# Patient Record
Sex: Male | Born: 1935 | Race: White | Hispanic: No | Smoking: Former smoker
Health system: Southern US, Community
[De-identification: ages and names within clinical notes are randomized; demographics above are authoritative.]

## PROBLEM LIST (undated history)

## (undated) DIAGNOSIS — K5730 Diverticulosis of large intestine without perforation or abscess without bleeding: Secondary | ICD-10-CM

## (undated) DIAGNOSIS — I6529 Occlusion and stenosis of unspecified carotid artery: Secondary | ICD-10-CM

## (undated) DIAGNOSIS — C44300 Unspecified malignant neoplasm of skin of unspecified part of face: Secondary | ICD-10-CM

## (undated) DIAGNOSIS — M1990 Unspecified osteoarthritis, unspecified site: Secondary | ICD-10-CM

## (undated) DIAGNOSIS — M25561 Pain in right knee: Secondary | ICD-10-CM

## (undated) DIAGNOSIS — N400 Enlarged prostate without lower urinary tract symptoms: Secondary | ICD-10-CM

## (undated) DIAGNOSIS — I4891 Unspecified atrial fibrillation: Secondary | ICD-10-CM

## (undated) DIAGNOSIS — I10 Essential (primary) hypertension: Secondary | ICD-10-CM

## (undated) HISTORY — PX: INGUINAL HERNIA REPAIR: SUR1180

## (undated) HISTORY — PX: TOTAL HIP ARTHROPLASTY: SHX124

## (undated) HISTORY — PX: COLON RESECTION: SHX5231

## (undated) HISTORY — PX: JOINT REPLACEMENT: SHX530

## (undated) HISTORY — PX: SKIN CANCER EXCISION: SHX779

## (undated) HISTORY — DX: Occlusion and stenosis of unspecified carotid artery: I65.29

---

## 2000-04-26 ENCOUNTER — Ambulatory Visit (HOSPITAL_COMMUNITY): Admission: RE | Admit: 2000-04-26 | Discharge: 2000-04-26 | Payer: Self-pay | Admitting: *Deleted

## 2001-11-29 ENCOUNTER — Encounter: Payer: Self-pay | Admitting: Orthopedic Surgery

## 2001-12-05 ENCOUNTER — Encounter: Payer: Self-pay | Admitting: Orthopedic Surgery

## 2001-12-05 ENCOUNTER — Inpatient Hospital Stay (HOSPITAL_COMMUNITY): Admission: RE | Admit: 2001-12-05 | Discharge: 2001-12-10 | Payer: Self-pay | Admitting: Orthopedic Surgery

## 2001-12-08 ENCOUNTER — Encounter: Payer: Self-pay | Admitting: Orthopedic Surgery

## 2002-01-24 ENCOUNTER — Encounter: Payer: Self-pay | Admitting: Orthopedic Surgery

## 2002-01-24 ENCOUNTER — Encounter: Payer: Self-pay | Admitting: Emergency Medicine

## 2002-01-24 ENCOUNTER — Ambulatory Visit (HOSPITAL_COMMUNITY): Admission: EM | Admit: 2002-01-24 | Discharge: 2002-01-24 | Payer: Self-pay | Admitting: Emergency Medicine

## 2002-07-05 ENCOUNTER — Ambulatory Visit (HOSPITAL_BASED_OUTPATIENT_CLINIC_OR_DEPARTMENT_OTHER): Admission: RE | Admit: 2002-07-05 | Discharge: 2002-07-05 | Payer: Self-pay | Admitting: Orthopedic Surgery

## 2002-08-21 ENCOUNTER — Encounter: Payer: Self-pay | Admitting: Orthopedic Surgery

## 2002-08-24 ENCOUNTER — Observation Stay (HOSPITAL_COMMUNITY): Admission: RE | Admit: 2002-08-24 | Discharge: 2002-08-26 | Payer: Self-pay | Admitting: Orthopedic Surgery

## 2004-04-29 ENCOUNTER — Ambulatory Visit: Admission: RE | Admit: 2004-04-29 | Discharge: 2004-05-19 | Payer: Self-pay | Admitting: Radiation Oncology

## 2004-05-20 ENCOUNTER — Ambulatory Visit: Admission: RE | Admit: 2004-05-20 | Discharge: 2004-06-24 | Payer: Self-pay | Admitting: Radiation Oncology

## 2004-07-16 ENCOUNTER — Ambulatory Visit: Admission: RE | Admit: 2004-07-16 | Discharge: 2004-09-19 | Payer: Self-pay | Admitting: Radiation Oncology

## 2004-07-22 ENCOUNTER — Encounter: Admission: RE | Admit: 2004-07-22 | Discharge: 2004-07-22 | Payer: Self-pay | Admitting: Urology

## 2004-10-14 ENCOUNTER — Ambulatory Visit: Admission: RE | Admit: 2004-10-14 | Discharge: 2004-10-21 | Payer: Self-pay | Admitting: Radiation Oncology

## 2004-11-04 ENCOUNTER — Ambulatory Visit: Admission: RE | Admit: 2004-11-04 | Discharge: 2005-02-02 | Payer: Self-pay | Admitting: Radiation Oncology

## 2004-12-03 ENCOUNTER — Inpatient Hospital Stay (HOSPITAL_COMMUNITY): Admission: EM | Admit: 2004-12-03 | Discharge: 2004-12-04 | Payer: Self-pay | Admitting: Emergency Medicine

## 2005-02-03 ENCOUNTER — Ambulatory Visit: Admission: RE | Admit: 2005-02-03 | Discharge: 2005-02-20 | Payer: Self-pay | Admitting: Radiation Oncology

## 2005-05-23 ENCOUNTER — Emergency Department (HOSPITAL_COMMUNITY): Admission: EM | Admit: 2005-05-23 | Discharge: 2005-05-23 | Payer: Self-pay | Admitting: *Deleted

## 2005-06-26 ENCOUNTER — Inpatient Hospital Stay (HOSPITAL_COMMUNITY): Admission: RE | Admit: 2005-06-26 | Discharge: 2005-07-01 | Payer: Self-pay | Admitting: Orthopedic Surgery

## 2005-08-05 ENCOUNTER — Encounter: Admission: RE | Admit: 2005-08-05 | Discharge: 2005-08-05 | Payer: Self-pay | Admitting: Internal Medicine

## 2006-01-05 ENCOUNTER — Encounter: Admission: RE | Admit: 2006-01-05 | Discharge: 2006-01-05 | Payer: Self-pay | Admitting: Internal Medicine

## 2006-12-20 ENCOUNTER — Encounter: Admission: RE | Admit: 2006-12-20 | Discharge: 2006-12-20 | Payer: Self-pay | Admitting: Interventional Cardiology

## 2008-12-19 ENCOUNTER — Ambulatory Visit: Payer: Self-pay | Admitting: Ophthalmology

## 2009-11-26 ENCOUNTER — Ambulatory Visit: Payer: Self-pay | Admitting: Vascular Surgery

## 2009-12-02 ENCOUNTER — Inpatient Hospital Stay (HOSPITAL_COMMUNITY): Admission: RE | Admit: 2009-12-02 | Discharge: 2009-12-03 | Payer: Self-pay | Admitting: Vascular Surgery

## 2009-12-02 ENCOUNTER — Encounter: Payer: Self-pay | Admitting: Vascular Surgery

## 2009-12-02 ENCOUNTER — Ambulatory Visit: Payer: Self-pay | Admitting: Vascular Surgery

## 2009-12-02 HISTORY — PX: CAROTID ENDARTERECTOMY: SUR193

## 2009-12-24 ENCOUNTER — Ambulatory Visit: Payer: Self-pay | Admitting: Vascular Surgery

## 2010-01-16 ENCOUNTER — Ambulatory Visit (HOSPITAL_COMMUNITY): Admission: RE | Admit: 2010-01-16 | Discharge: 2010-01-17 | Payer: Self-pay | Admitting: General Surgery

## 2010-04-06 ENCOUNTER — Encounter: Payer: Self-pay | Admitting: Internal Medicine

## 2010-05-27 LAB — BASIC METABOLIC PANEL
BUN: 23 mg/dL (ref 6–23)
Calcium: 9.7 mg/dL (ref 8.4–10.5)
Chloride: 108 mEq/L (ref 96–112)
GFR calc non Af Amer: >60 mL/min (ref >60)

## 2010-05-27 LAB — DIFFERENTIAL
Basophils Absolute: 0 10*3/uL (ref 0.0–0.1)
Eosinophils Absolute: 0 10*3/uL (ref 0.0–0.7)
Eosinophils Relative: 0 % (ref 0–5)
Lymphocytes Relative: 24 % (ref 12–46)
Lymphs Abs: 1.2 10*3/uL (ref 0.7–4.0)
Monocytes Absolute: 0.5 10*3/uL (ref 0.1–1.0)
Neutro Abs: 3.2 10*3/uL (ref 1.7–7.7)

## 2010-05-27 LAB — SURGICAL PCR SCREEN
MRSA, PCR: NEGATIVE
Staphylococcus aureus: NEGATIVE

## 2010-05-27 LAB — CBC
RBC: 4.13 MIL/uL — ABNORMAL LOW (ref 4.22–5.81)
WBC: 4.9 10*3/uL (ref 4.0–10.5)

## 2010-05-27 LAB — APTT: aPTT: 38 seconds — ABNORMAL HIGH (ref 24–37)

## 2010-05-27 LAB — PROTIME-INR: Prothrombin Time: 15.1 seconds (ref 11.6–15.2)

## 2010-05-29 LAB — CBC
Hemoglobin: 11.1 g/dL — ABNORMAL LOW (ref 13.0–17.0)
MCH: 27.5 pg (ref 26.0–34.0)
MCHC: 33.3 g/dL (ref 30.0–36.0)
MCHC: 33.6 g/dL (ref 30.0–36.0)
MCV: 81.9 fL (ref 78.0–100.0)
RBC: 4.03 MIL/uL — ABNORMAL LOW (ref 4.22–5.81)
RDW: 15.2 % (ref 11.5–15.5)

## 2010-05-29 LAB — APTT: aPTT: 37 seconds (ref 24–37)

## 2010-05-29 LAB — ABO/RH: ABO/RH(D): A POS

## 2010-05-29 LAB — TYPE AND SCREEN: Antibody Screen: NEGATIVE

## 2010-05-29 LAB — COMPREHENSIVE METABOLIC PANEL
Albumin: 4 g/dL (ref 3.5–5.2)
CO2: 27 mEq/L (ref 19–32)
Calcium: 10.1 mg/dL (ref 8.4–10.5)
Chloride: 106 mEq/L (ref 96–112)
GFR calc Af Amer: >60 mL/min (ref >60)
Potassium: 5.3 mEq/L — ABNORMAL HIGH (ref 3.5–5.1)

## 2010-05-29 LAB — BASIC METABOLIC PANEL
Chloride: 104 mEq/L (ref 96–112)
Glucose, Bld: 146 mg/dL — ABNORMAL HIGH (ref 70–99)
Sodium: 134 mEq/L — ABNORMAL LOW (ref 135–145)

## 2010-05-29 LAB — URINALYSIS, ROUTINE W REFLEX MICROSCOPIC
Glucose, UA: NEGATIVE mg/dL
Hgb urine dipstick: NEGATIVE
Urobilinogen, UA: 1 mg/dL (ref 0.0–1.0)
pH: 5 (ref 5.0–8.0)

## 2010-05-29 LAB — PROTIME-INR: Prothrombin Time: 15.1 seconds (ref 11.6–15.2)

## 2010-05-29 LAB — SURGICAL PCR SCREEN: MRSA, PCR: NEGATIVE

## 2010-06-24 ENCOUNTER — Ambulatory Visit: Payer: Self-pay | Admitting: Vascular Surgery

## 2010-06-24 ENCOUNTER — Other Ambulatory Visit: Payer: Self-pay

## 2010-07-22 ENCOUNTER — Ambulatory Visit (INDEPENDENT_AMBULATORY_CARE_PROVIDER_SITE_OTHER): Payer: Medicare Other | Admitting: Vascular Surgery

## 2010-07-22 ENCOUNTER — Other Ambulatory Visit (INDEPENDENT_AMBULATORY_CARE_PROVIDER_SITE_OTHER): Payer: Medicare Other

## 2010-07-22 DIAGNOSIS — Z48812 Encounter for surgical aftercare following surgery on the circulatory system: Secondary | ICD-10-CM

## 2010-07-22 DIAGNOSIS — I6529 Occlusion and stenosis of unspecified carotid artery: Secondary | ICD-10-CM

## 2010-07-23 NOTE — Assessment & Plan Note (Signed)
OFFICE VISIT  REEDER, BRISBY DOB:  05-28-1923                                       07/22/2010 CHART#:[id number]  Patient presents today for a 64-month follow-up of his right carotid endarterectomy for severe asymptomatic disease on 12/02/09.  He did well following his surgery and has had no neurologic deficits and has had no difficulty with the surgical healing.  He has had no new change in his medical status.  He denies any amaurosis fugax, transient ischemic attack, or stroke.  He continues to be a nonsmoker.  PHYSICAL EXAMINATION:  A well-developed and well-nourished white male appearing his stated age.  Blood pressure is 167/72, pulse 41, respirations 20.  HEENT:  Normal.  His right carotid is well-healed without carotid bruits.  He has 2+ radial pulses bilaterally. Neurologically, he is grossly intact without focal weakness or paresthesias.  He underwent a repeat duplex in our office, and this reveals widely patent endarterectomy with no evidence of stenosis and no evidence of stenosis in the left carotid system.  I reassured with this study.  We will follow him in the future in our noninvasive vascular lab protocol with a repeat duplex in 6 months and then drop back to yearly duplex following this.  He will notify us should he develop any difficulty, otherwise we will see him again at that time.    Larina Earthly, M.D. Electronically Signed  TFE/MEDQ  D:  07/22/2010  T:  07/23/2010  Job:  [id number]  cc:   Deirdre Peer. Polite, M.D. Cherylynn Ridges, M.D.

## 2010-07-29 NOTE — H&P (Signed)
HISTORY AND PHYSICAL EXAMINATION   November 26, 2009   Re:  [name], KERLIN                DOB:  Sep 24, 1923   REASON FOR ADMISSION:  Severe asymptomatic right internal carotid artery  stenosis.   HISTORY OF PRESENT ILLNESS:  Patient is an active, healthy 77 year old  gentleman who was found by Dr. Megan Mans to have a carotid bruit.  He was  being seen for preoperative evaluation for potential left inguinal  hernia repair.  He subsequently underwent carotid duplex, which revealed  severe stenosis in the right internal carotid artery.  No significant  stenosis in the left internal carotid artery.  I am seeing him in the  office for further evaluation of this.  Patient denies any prior  symptoms related to this, specifically any amaurosis fugax, transient  ischemic attack, or stroke.  He is right-handed.  He is otherwise very  healthy.  He does have a long-term history of atrial fibrillation for  which he is on warfarin therapy but does not have any history of  myocardial infarction.  He does have a history of hypertension.  He does  not have diabetes or other pulmonary difficulties.  He does have a prior  surgical history of right hip replacement and has had partial colon  resection for diverticulitis.  He has a chronic ventral incisional  hernia from this.  This is not causing him any pain.   SOCIAL HISTORY:  He is widowed.  He does not smoke, having quit in 1980.  Does not drink alcohol.   FAMILY HISTORY:  Negative for premature cirrhotic disease.   REVIEW OF SYSTEMS:  Documented in his chart:  No weight loss or weight  gain in general.  VASCULAR:  Negative.  CARDIAC:  Atrial fibrillation.  GI: Hiatal hernia.  NEUROLOGIC:  Negative.  PULMONARY:  Negative.  HEMATOLOGIC:  Negative.  URINARY:  Positive for frequent urination.  EARS, NOSE, AND THROAT:  Change in hearing.  Change in eyesight.  He  does wear a hearing aid.  MUSCULOSKELETAL:  Positive arthritis  and joint pain.  PSYCHIATRIC:  Negative.  SKIN:  Negative.   PHYSICAL EXAM:  A well-developed, thin white male appearing his stated  age of 84 in no acute distress.  Blood pressure is 163/84, pulse 56,  respirations 18.  He is in no acute distress.  HEENT is normal.  Chest:  Clear bilaterally without rales, rhonchi or wheezes.  Cardiovascular:  He does have a soft carotid bruit in the right and on the left.  Heart  is irregular with no murmur.  His radial, femoral, and popliteal pulses  are 2+ bilaterally.  He has 1+ dorsalis pedis pulses bilaterally.  Abdomen: Soft, nontender.  No masses noted.  He does have a  periumbilical ventral incisional hernia, which is easily inducible.  He  does not have an aneurysm.  Musculoskeletal shows no major deformities  or cyanosis.  Neurologic:  No focal weakness or paresthesias.  Skin  without ulcers or rashes.  Lymphatics without cervical or inguinal  lymphadenopathy.   ALLERGIES:  None.   CURRENT MEDICATIONS:  Warfarin 5 mg as directed.  Digoxin 200 mcg 1  daily.  Enablex 5 mg daily, atenolol 100 mg daily, Tamsulosin 0.4 mg  daily, quinapril 40 mg 2 tablets daily, B12 1000 mcg p.o. daily.   IMPRESSION:  Severe asymptomatic right internal carotid artery stenosis.   PLAN:  I discussed the significance of this  at length with patient and  his daughter present.  I have recommended a right carotid endarterectomy  for reduction of stroke risk.  He understands the risk of a stroke with  surgery is in the 1% to 2% range and the risk for stroke or transient  ischemic attack in the 5% per year range uncorrected.  He understands  and wishes to proceed with surgery.  We have scheduled this on  12/02/2009 at [facility].     Larina Earthly, M.D.  Electronically Signed   TFE/MEDQ  D:  11/26/2009  T:  11/26/2009  Job:  [id number]   cc:   Cherylynn Ridges, M.D.  Deirdre Peer. Polite, M.D.

## 2010-07-29 NOTE — Assessment & Plan Note (Signed)
OFFICE VISIT   RYSHAWN, SANZONE  DOB:  04-15-1923                                       12/24/2009  CHART#:[id number]   Patient presents today for follow-up of his right carotid endarterectomy  for asymptomatic disease on 12/02/09.  He did well in the hospital and  was discharged home on postoperative day #1.  He has had no neurologic  deficits, no wound problems, and has returned to his baseline.   His neck incision is well-healed.  He has no bruits bilaterally.  He is  neurologically intact.  He does have a left inguinal hernia which is  causing him increasing discomfort.   He had a preop evaluation by Dr. Lindie Spruce, who discovered the bruit, and  was sent for carotid surgery prior to his left inguinal hernia.  He is  quite anxious to proceed as soon as possible.  I discussed this by  telephone today with Dr. Dixon Boos nurse, and they will continue with the  progress towards left inguinal hernia repair.     Larina Earthly, M.D.  Electronically Signed   TFE/MEDQ  D:  12/24/2009  T:  12/25/2009  Job:  [id number]   cc:   Cherylynn Ridges, M.D.  Deirdre Peer. Polite, M.D.

## 2010-07-29 NOTE — Procedures (Signed)
CAROTID DUPLEX EXAM   INDICATION:  Carotid artery disease.   HISTORY:  Diabetes:  No.  Cardiac:  No.  Hypertension:  Yes.  Smoking:  No.  Previous Surgery:  No.  CV History:  Asymptomatic.  Amaurosis Fugax , Paresthesias , Hemiparesis                                       RIGHT             LEFT  Brachial systolic pressure:         128               120  Brachial Doppler waveforms:         Within normal limits                Within normal limits  Vertebral direction of flow:        Antegrade         Antegrade  DUPLEX VELOCITIES (cm/sec)  CCA peak systolic                   61                92  ECA peak systolic                   88                80  ICA peak systolic                   451               92  ICA end diastolic                   119               30  PLAQUE MORPHOLOGY:                  Heterogenous      Smooth,  heterogenous  PLAQUE AMOUNT:                      Large             Mild/moderate  PLAQUE LOCATION:                    ICA               CCA, ICA   IMPRESSION:  1. Right internal carotid artery velocities suggest an 80% to 99%      stenosis.  2. Left internal carotid artery velocities suggest a 20% to 39%      stenosis.   ___________________________________________  Larina Earthly, M.D.   EM/MEDQ  D:  11/26/2009  T:  11/26/2009  Job:  [id number]

## 2010-07-31 NOTE — Procedures (Unsigned)
CAROTID DUPLEX EXAM  INDICATION:  Right carotid endarterectomy.  HISTORY: Diabetes:  No. Cardiac:  No. Hypertension:  Yes. Smoking:  No. Previous Surgery:  Right carotid endarterectomy on 12/02/2009. CV History:  Currently asymptomatic. Amaurosis Fugax No, Paresthesias No, Hemiparesis No.                                      RIGHT             LEFT Brachial systolic pressure:         134               130 Brachial Doppler waveforms:         Normal            Normal Vertebral direction of flow:        Antegrade         Antegrade DUPLEX VELOCITIES (cm/sec) CCA peak systolic                   92                119 ECA peak systolic                   123               136 ICA peak systolic                   65                89 ICA end diastolic                   18                20 PLAQUE MORPHOLOGY:                  Heterogenous      Mixed PLAQUE AMOUNT:                      Mild              Mild PLAQUE LOCATION:                    CCA               ICA/ECA/CCA  IMPRESSION: 1. Patent right carotid endarterectomy site with no right internal     carotid artery stenosis. 2. No hemodynamically significant stenosis of the left proximal     internal carotid artery with plaque formations as described above. 3. Significant improvement of the right internal carotid artery     velocities when compared to the previous examination on 11/26/2009     with the left internal carotid artery remaining stable.  ___________________________________________ Larina Earthly, M.D.  CH/MEDQ  D:  07/23/2010  T:  07/23/2010  Job:  [id number]

## 2010-08-01 NOTE — Op Note (Signed)
NAME:  Ruben, Burns                          ACCOUNT NO.:  [id number]   MEDICAL RECORD NO.:  [id number]                   PATIENT TYPE:  INP   LOCATION:  0004                                 FACILITY:  [facility]   PHYSICIAN:  Gus Rankin. Aluisio, M.D.              DATE OF BIRTH:  06-Nov-1923   DATE OF PROCEDURE:  12/05/2001  DATE OF DISCHARGE:                                 OPERATIVE REPORT   PREOPERATIVE DIAGNOSIS:  Osteoarthritis, right hip.   POSTOPERATIVE DIAGNOSIS:  Osteoarthritis, right hip.   PROCEDURE:  Right total hip arthroplasty.   SURGEON:  Gus Rankin. Aluisio, M.D.   ASSISTANT:  Alexzandrew L. Julien Girt, P.A.   ANESTHESIA:  General.   ESTIMATED BLOOD LOSS:  350   DRAINS:  Hemovac x1.   COMPLICATIONS:  None.   CONDITION:  Stable to recovery.   BRIEF CLINICAL NOTE:  [name] is a 77 year old male with severe  osteoarthritis of the right hip with pain refractory to nonoperative  management. He presents now for right total hip arthroplasty.   DESCRIPTION OF PROCEDURE:  After successful administration of general  anesthetic, the patient was placed in the left lateral decubitus position,  right side up and held with the hip positioner. The right lower extremity  was isolated from his perineum with plastic drapes and prepped and draped in  the usual sterile fashion. Many posterolateral incisions utilized, skin cut  with a 10 blade in the subcutaneous tissue to the level of the fascia lata  which was incised in line with the skin incision. The sciatic nerve was  palpated and protected, short external rotators isolated off the femur,  capsulectomy performed. Hip is dislocated and center of the femoral head  marked. Trial prosthesis placed such that the center of the trial head  corresponds with the center of his native femoral head. Osteotomy line is  marked on the femoral neck and osteotomy noted with an oscillating saw. The  femur is then retracted anteriorly and  acetabular exposure obtained.   The labrum is removed and all soft tissue on the border of the acetabulum  removed, we had excellent exposure. Reaming is started with a 51 mm coursing  in increments of 2 to a 50. A 60 mm pinnacle acetabular shell is then placed  in his anatomic eversion transfixed with a dome screw. A trial 32 mm neutral  liner is then placed.   The femur is repaired first with the canal finder and then with irrigation.  Axial reaming is performed up to 15.5 mm, proximal reaming up to 16F and the  sleeve machine to a large, a 16F large trial sleeve is placed, a 20 x 15  stem, 36 plus 8 neck and we placed it matching his native anteversion. A 32  plus zero head trial is placed, hip reduced. There is a little bit of soft  tissue laxity. We subsequently tried a  32 plus 6 which corrected the soft  tissue laxity and did not leak any lengthening of the leg compared to the  other side. The hip was reduced with outstanding stability, full extension,  full external rotation, 70 degrees flexion, 40 degrees adduction, 90 degrees  internal rotation and 90 degrees flexion, 70 degrees internal rotation. The  wound is copiously irrigated, trial is removed and the permanent apex hole  eliminator placed and the permanent 32 mm neutral marathon liner is placed  into the 60 mm shell. Permanent 56F large sleeve is placed and the 20 x 15  stem with 36 plus 8 neck matching his anteversion. The permanent 32 plus 6  head is placed, hip reduced with the same stability parameters. The patient  is copiously irrigated with antibiotic solution and short external rotators  reattached to the femur through drill holes. The fascia lata was closed over  a Hemovac drain, interrupted #1 Vicryl, subcu closed with #1 and 2-0 Vicryl,  subcuticular with running 4-0 Monocryl. The incision and cleaned and dried  and Steri-Strips and a bulky sterile dressing applied. The patient  subsequently awakened and  transported to recovery in stable condition.                                               Gus Rankin Aluisio, M.D.    FVA/MEDQ  D:  12/05/2001  T:  12/05/2001  Job:  [id number]

## 2010-08-01 NOTE — Op Note (Signed)
NAMEFREDIE, MAJANO NO.:  [id number]   MEDICAL RECORD NO.:  [id number]          PATIENT TYPE:  INP   LOCATION:  1824                         FACILITY:  [facility]   PHYSICIAN:  Leonides Grills, M.D.     DATE OF BIRTH:  [birth year]   DATE OF PROCEDURE:  12/03/2004  DATE OF DISCHARGE:                                 OPERATIVE REPORT   PREOPERATIVE DIAGNOSIS:  Right dislocated total hip arthroplasty.   POSTOPERATIVE DIAGNOSIS:  Right dislocated total hip arthroplasty.   PROCEDURE:  Closed reduction, right dislocated total hip arthroplasty.   SURGEON:  Leonides Grills, M.D.   ASSISTANT:  Lianne Cure, P.A.   FINDINGS:  Post reduction film showed a concentric reduction.   INDICATIONS:  This is a 77 year old male who today was reaching for a phone  while sitting on the bed and felt immediate right hip pain.  This is his  second dislocated total hip.  His hip was done by Dr. Lequita Halt three years  ago.  He was consented for the above procedure.  All risks, including  fracture and the possibility of not being able to reduce the hip, were all  explained, questions were encouraged and answered.   PROCEDURE:  The patient was brought to the operating room and placed in the  supine position after adequate general endotracheal anesthesia was  administered.  Under general reduction, 90 degrees hip flexion, 90 degrees  knee flexion, the hip was gently reduced.  X-ray was obtained and showed a  concentric reduction.  A knee immobilizer was applied.  The patient was  taken to the PAR.      Leonides Grills, M.D.  Electronically Signed     PB/MEDQ  D:  12/03/2004  T:  12/04/2004  Job:  [id number]

## 2010-08-01 NOTE — Op Note (Signed)
NAME:  [name], [name]                          ACCOUNT NO.:  [id number]   MEDICAL RECORD NO.:  [id number]                   PATIENT TYPE:  AMB   LOCATION:  DSC                                  FACILITY:  [facility]   PHYSICIAN:  Dionne Ano. Everlene Other, M.D.         DATE OF BIRTH:  [birth year]   DATE OF PROCEDURE:  08/24/2002  DATE OF DISCHARGE:  07/05/2002                                 OPERATIVE REPORT   PREOPERATIVE DIAGNOSIS:  Scapholunate advanced collapse, left wrist.  Degenerative changes with failure of conservative management.   POSTOPERATIVE DIAGNOSIS:  Scapholunate advanced collapse, left wrist.  Degenerative changes with failure of conservative management.   PROCEDURE:  1. Left scaphoid excision (corpectomy of the scaphoid).  2. Left four-corner fusion with autologous bone graft (capitate, hamate,     lunate, and triquetral fusion) with Acumed bone fusion plate and     autologous bone graft.  3. Use of Symphony platelet ridge aggregate from [employer].  4. PIN neurectomy (posterior interosseous nerve neurectomy), left wrist.  5. Stress radiography, left wrist.  6. Left wrist partial styloidectomy.   SURGEON:  Dionne Ano. Amanda Pea, M.D.   ASSISTANT:  Karie Chimera, P.A.-C.   COMPLICATIONS:  None.   ANESTHESIA:  General with preoperative axillary block.   ESTIMATED BLOOD LOSS:  Minimal.   DRAINS:  One.   COMPLICATIONS:  None.   INDICATIONS FOR THE PROCEDURE:  This patient is a 77 year old male who  presents with the above-mentioned diagnosis.  I have counseled him in  regards to the risks and benefits of surgery, including risk of patient  bleeding, anesthesia, damage to normal structures, and failure of the  surgery to accomplish the intended goals of relieving symptoms and restoring  function.  The patient has asked to proceed.  All questions have been  encouraged and answered preoperatively.   OPERATIVE FINDINGS:  The patient had end-stage degenerative change about  the  scaphoid fossa and scaphoid.  He had capitolunate degenerative changes,  which were quite severe.  The lunate fossa and lunate most proximal aspects  were intact and okay for a four-corner effusion.  He underwent four-corner  effusion, partial styloidectomy, scaphoid excision, PIN neurectomy, and  stress radiography without difficulty.   OPERATIVE PROCEDURE IN DETAIL:  The patient was given anesthesia  preoperatively.  He was given an axillary block.  It was slightly  incomplete, and he underwent a general anesthetic in the operative suite.  I  made sure that the patient had preoperative antibiotics in the form of  Ancef.  He was again lain supine and appropriately padded and prepped and  draped in the usual sterile fashion with Betadine scrub and paint about the  left upper extremity.  I performed examination under anesthesia revealing  crepitance, which was not surprising, given his preoperative diagnosis.  Following this, he had a midline incision made.  Dissection was carried  sharply down to the retinaculum, which  was identified.  It was then Z-  lengthened after skin flaps were elevated.  I created an interval between  the third and fourth dorsal compartments.  The EPL was transposed in the  subcutaneous tissue, and a PIN (posterior interosseous nerve) neurectomy was  performed with correction and cauterization technique.  I then entered the  capsule and performed a synovectomy and debridement.  I then performed a  scaphoid excision with combination of curette, osteotome, and rongeur.  Once  this was done, I then irrigated the wound copiously and performed a partial  styloidectomy by removing the distal 5 mm of the styloid tip where  degenerative changes were notable and a prominence palpable.  This was  smoothed, and care was taken to avoid the superficial radial nerve and the  deep branch of the radial artery during this dissection.  Following this,  the patient underwent  identification of the capitolunate, lunatotriquetral,  capitohamate, and lunatohamate  joint.  The patient had the articular  surfaces denuded by the four-corner effusion preparation areas, including  the capitohamate, capitolunate, lunatotriquetral, and triquetrohamate  joints.  These were denuded with a rongeur to cancellous bone to provide  ample volume/surface area for a four-corner effusion.  Once this was done, I  then took the reamer for the Acumed system and assembled it.  Prior to this,  I preplaced three K-wires for stability purposes about the four-corner  construct according to protocol.  Prior to this, I reduced the lunate in the  fossa and made sure it was out of the dorsiflexed position, which was noted  preoperatively.  It was corrected, checked under x-ray, stress radiography  performed, and following this, the reamer was placed, and the patient then  had placement of the Acumed plate.  Seven screws were placed, cancellous in  nature, which received excellent purchase.  The hamate, followed by lunate,  followed by capitate, followed by triquetral screws were placed, and then  additional capitate, lunate, and triquetral screws were placed to my  satisfaction.  Ample bone graft was placed pre- and post-plate fixation.  Following this, the platelet ridge mixture was placed in the wound, and the  cap was placed along with additional bone graft.  I then closed the  longitudinal limb of the capsule but did not close the transverse limb so as  not to cause a stiffness postoperatively.  I then repaired the Z-lengthened  retinaculum and left the EPL transposed in subcutaneous position and  deflated the tourniquet.  Thrombin rich and poor were placed in the wound,  and the patient had excellent refill, soft compartments, and minimal  bleeding, as bleeding was controlled during initial dissection with bipolar electrocautery and regular cautery.  The patient tolerated this well.  The   wound was then closed in layers with 3-0 Vicryl, followed by Prolene,  followed by placement of thrombin poor about the skin edge.  Xeroform was  placed.  The drain was hooked up to suction, which was a TLS drain, and the  patient then had a volar plaster splint applied, thumb spica in nature.  He  tolerated this well, and there were no complicating features.  He was taken  to the recovery room in stable condition.  All sponge, needle, and  instrument counts were reported as correct.  He will be monitored and kept  overnight for IV antibiotics, pain medicine, OT consult for doing controlled  range of motion, and appropriate postop measures.  I have discussed  ________, Lawson Radar,  and all questions have been encouraged and answered.  It  has been a pleasure to see him.                                               Dionne Ano. Everlene Other, M.D.    Nash Mantis  D:  08/24/2002  T:  08/25/2002  Job:  [id number]

## 2010-08-01 NOTE — H&P (Signed)
NAME:  [name], [name]                          ACCOUNT NO.:  [id number]   MEDICAL RECORD NO.:  [id number]                   PATIENT TYPE:  INP   LOCATION:  NA                                   FACILITY:  [facility]   PHYSICIAN:  Gus Rankin. Aluisio, M.D.              DATE OF BIRTH:  [birth year]   DATE OF ADMISSION:  12/05/2001  DATE OF DISCHARGE:                                HISTORY & PHYSICAL   CHIEF COMPLAINT:  Right hip pain.   HISTORY OF PRESENT ILLNESS:  The patient is a 77 year old male who has been  seen in consultation by Dr. Ollen Gross.  The pain has been increasing  now.  It has been intermittent over the past several years, but has become  more progressive and constant over the past six months.  He has difficulty  in pain with weightbearing and has also started experiencing pain at night.  He is very active but the pain has started to interfere with his daily  activities.  He is seen in the office and found to have osteoarthritis of  the right hip.  It is felt he has reached a point where he can benefit from  undergoing hip replacement.  Risks and benefits have been discussed.  The  patient has elected to proceed with surgery.   ALLERGIES:  No known drug allergies.   MEDICATIONS:  1. Coumadin daily stopped prior to surgery.  2. Accupril 40 mg daily.  3. Toprol XL 50 mg daily.  4. Celebrex 200 mg b.i.d.   PAST MEDICAL HISTORY:  Atrial fibrillation, diverticulosis, hypertension,  and also a history of colon polyps.   PAST SURGICAL HISTORY:  He has undergone a colonoscopy with polypectomy.   SOCIAL HISTORY:  He is widowed.  Has three children.  Denies use of tobacco  products, only occasional intake of alcohol.   FAMILY HISTORY:  Mother deceased age 78 with a history of arthritis.  Father  deceased age 73 with heart trouble.   REVIEW OF SYMPTOMS:  GENERAL:  No fevers, chills, night sweats.  NEUROLOGIC:  No seizures, syncope, or paralysis.  RESPIRATORY:  No shortness  of breath,  productive cough, or hemoptysis.  CARDIOVASCULAR:  He does have  hypertension, atrial fibrillation.  No chest pain, angina, orthopnea.  GASTROINTESTINAL:  No nausea, vomiting, diarrhea, constipation.  GENITOURINARY:  No dysuria, hematuria, discharge.  MUSCULOSKELETAL:  Pertinent that of the right hip found in the history of present illness.   PHYSICAL EXAMINATION:  VITAL SIGNS:  Pulse 82, respirations 16, blood  pressure 142/76.  GENERAL:  The patient is a 77 year old male well-nourished, well-developed,  appears to be in no acute distress.  He is alert, oriented, and cooperative,  very pleasant at time of examination.  HEENT:  Normocephalic, atraumatic.  Pupils round and reactive.  Oropharynx  clear.  EOMs are intact.  The patient does have upper and lower dentures  noted.  NECK:  Supple.  CHEST:  Clear to auscultation anterior and posterior chest walls.  HEART:  Irregular rhythm is noted.  S1 and S2.  No rubs, thrills,  palpitations, or murmurs are appreciated.  ABDOMEN:  Soft, flat, nontender.  Bowel sounds are present.  RECTAL:  Not done.  Not pertinent to present illness.  BREASTS:  Not done.  Not pertinent to present illness.  GENITALIA:  Not done.  Not pertinent to present illness.  EXTREMITIES:  Right lower extremity:  The right hip has flexion up to 90  degrees.  There is no internal rotation, only 5 degrees with external  rotation, only 10 degrees of abduction.  During ambulation he does have an  antalgic gait with an abductor lurch to his gait.  Motor function is intact.   IMPRESSION:  1. Osteoarthritis right hip.  2. Atrial fibrillation.  3. Hypertension.  4. Diverticulosis.  5. History of colon polyps with polypectomy.   PLAN:  The patient will be admitted to [facility] to undergo a  right total hip replacement arthroplasty.  The patient is to be admitted for  surgery.  Surgery will be performed by Dr. Ollen Gross.     Alexzandrew L.  [place], [place]                Gus Rankin Aluisio, M.D.    ALP/MEDQ  D:  11/30/2001  T:  11/30/2001  Job:  [id number]   cc:   Ike Bene, MD  [address]  [place]  [place] [address]  Fax: [phone]

## 2010-08-01 NOTE — Op Note (Signed)
   NAME:  [name], [name] NO.:  [id number]   MEDICAL RECORD NO.:  [id number]                   PATIENT TYPE:  AMB   LOCATION:                                       FACILITY:  [facility]   PHYSICIAN:  Artist Pais. Mina Marble, M.D.           DATE OF BIRTH:  1924/01/31   DATE OF PROCEDURE:  07/05/2002  DATE OF DISCHARGE:                                 OPERATIVE REPORT   PREOPERATIVE DIAGNOSIS:  Complex laceration, right long finger.   POSTOPERATIVE DIAGNOSIS:  Complex laceration, right long finger.   PROCEDURE:  Irrigation and debridement and primary closure, complex  laceration, left long finger.   SURGEON:  Artist Pais. Mina Marble, M.D.   ASSISTANT:  None.   ANESTHESIA:  Digital sheath block performed by the surgeon.   TOURNIQUET:  Penrose drain.   COMPLICATIONS:  None.   DESCRIPTION OF PROCEDURE:  The patient was taken to the procedure room and  was placed supine on the operating table. Then a 1% plain Lidocaine digital  sheath block was performed by the surgeon. Adequate anesthesia was obtained.  The right hand was prepped and draped in the usual sterile fashion. A  Penrose drain was used as a tourniquet. Once this was done the complex  laceration on the volar and radial aspects of the long finger was irrigated  and debrided of nonviable material and the wound was closed with 5-0 nylon.  After this was done a sterile dressing of Xeroform, 4 x 4s and Coban were  applied. The patient tolerated the procedure well. He went to the recovery  room and then home in stable fashion. He was discharged on Keflex 500 mg 1  p.o.  q.i.d. for 1 week for antibiotic prophylaxis and Vicodin for pain.                                               Artist Pais Mina Marble, M.D.    MAW/MEDQ  D:  07/05/2002  T:  07/06/2002  Job:  [id number]

## 2010-08-01 NOTE — Consult Note (Signed)
[name], TIERNO NO.:  [id number]   MEDICAL RECORD NO.:  [id number]          PATIENT TYPE:  INP   LOCATION:  1824                         FACILITY:  [facility]   PHYSICIAN:  Leonides Grills, M.D.     DATE OF BIRTH:  [birth year]   DATE OF CONSULTATION:  12/03/2004  DATE OF DISCHARGE:                                   CONSULTATION   CHIEF COMPLAINT:  Right hip pain.   HISTORY:  This is a 77 year old male who underwent a right total hip  arthroplasty three years ago.  Initially postoperatively, dislocated his  hip.  He has done well since until today when he was sitting on his bed, he  turned to answer the phone and his hip dislocated.  He now presents with his  family.   PHYSICAL EXAMINATION:  His right lower extremity is short and externally  rotated, palpable pulses, active and passive range of motion of toes are  intact.  X-ray shows superior posterior dislocation of the right hip.  No  obvious fracture or loosening.   IMPRESSION:  Dislocated right total hip arthroplasty, posterior superior.   PLAN:  I explained to [name] as well as his family at this point we will  take him back for closed reduction for the longer the hip stays out, it is  much more difficult for it to be reduced.  We will do this immediately under  anesthesia and the patient agrees as well as the family.  We went over some  great detail today as well as the risks which include iatrogenic fracture,  possibility of not being able to reduce this and redislocation were all  explained.  Questions were encouraged and answered.      Leonides Grills, M.D.  Electronically Signed     PB/MEDQ  D:  12/03/2004  T:  12/04/2004  Job:  [id number]

## 2010-08-01 NOTE — Consult Note (Signed)
NAMELAYDEN, [name] NO.:  [id number]   MEDICAL RECORD NO.:  [id number]          PATIENT TYPE:  INP   LOCATION:  1513                         FACILITY:  [facility]   PHYSICIAN:  Meade Maw, M.D.    DATE OF BIRTH:  Apr 21, 1923   DATE OF CONSULTATION:  06/26/2005  DATE OF DISCHARGE:                                   CONSULTATION   CONSULTING PHYSICIAN:  Ollen Gross, M.D.   INDICATIONS FOR PROCEDURE:  Atrial fibrillation with rapid ventricular  response.   HISTORY:  [name] is a very pleasant 77 year old gentleman well known  to me for nonischemic cardiomyopathy with an ejection fraction of 35% found  during catheterization in February 2002. The patient has underwent a right  hip revision secondary to frequent dislocation of his hips. During surgery,  he had an abrupt episode of uncontrolled atrial fibrillation with rates of  140s to 150s. He was given IV cardizem  in the OR with subsequent return of  heart rates to 60s and 70s. He has had no further tachycardia arrhythmia  since 1:30 this afternoon. The patient has been followed by me extensively  in the past for nonischemic cardiomyopathy. He has been well compensated.  Activity has been limited by arthritic pain. He is able to walk several  miles until his hip became an issue. He has had no orthopnea, palpitations,  or tachycardia arrhythmias. He has been evaluated by Dr. Graciela Husbands in the past  for possible AICD implantation and Dr. Graciela Husbands felt that his exercise capacity  was excellent and therefore did not require AICD implantation.   PAST MEDICAL HISTORY:  Otherwise significant for hypertension, chronic  anticoagulation, diverticulitis, history of prostate cancer status post  radiation therapy.   CURRENT MEDICATIONS:  1.  Digitek 250 mcg daily.  2.  Quinapril 40 mg daily.  3.  Toprol XL 100 mg daily.  4.  Coumadin adjusted to maintain an INR of 2-3.   Of note, the patient's Toprol was increased from 50  mg to 100 mg on his last  office visit for greater antihypertensive control.   ALLERGIES:  He has no known drug allergies.   PAST SURGICAL HISTORY:  1.  Significant for colon resection for diverticulitis.  2.  Right total hip replacement.  3.  The patient right hip revision.  4.  Colonoscopy.   FAMILY HISTORY:  Noncontributory.   SOCIAL HISTORY:  He is a widow, has 3 children, grandchildren and great  grandchildren. Previously worked in [employer], dispensing. There is no history of  alcohol, tobacco or illicit drug use.   REVIEW OF SYSTEMS:  He has had no palpitations or tachycardia arrhythmias.  Prior to surgery he had been in his usual state of health.   PHYSICAL EXAMINATION:  GENERAL:  Reveals an elderly male appearing his  stated age. He is in no acute distress.  VITAL SIGNS:  Heart rate is ranging 50-80 beats per minute on telemetry.  Blood pressure is 160/76, respiratory rate 11. His SaO2 saturation is 100%  on 2 liters.  HEENT:  Unremarkable. He has good carotid upstrokes. There  is no neck bend  extension noted.  PULMONARY:  Reveals breath sounds which are equal and clear to auscultation.  CARDIOVASCULAR:  Reveals an irregular rhythm, there is normal S1, normal S2,  there is no S3 noted.  ABDOMEN:  Soft, benign with hypoactive bowel sounds.  EXTREMITIES:  Reveal distal pulses which are equal and palpable. Pneumatic  stockings are in place. Skin is warm and dry.  NEUROLOGIC:  Nonfocal. Orlene Erm is appropriate.   LABORATORY DATA:  Laboratory data from the 10th of April reveals a white  count of 4.9, hemoglobin of 12.9, hematocrit of 39, platelet count 153. He  had normal electrolytes, his creatinine was 1.0.   IMPRESSION:  1.  Atrial fibrillation. The patient is currently rate controlled. Would      continue with Toprol at 100 mg daily and digoxin 250 mcg daily. The      patient has been stable and therefore may transfer to a regular rehab      bed. He demonstrated normal  electrolytes and normal CBC on the 10th of      April. Would repeat his CBC for evaluation of anemia.  2.  Chronic anticoagulation. His Coumadin should be restarted in the      postoperative period. His INR goal is 2-3.  3.  Hypertension. His blood pressure is marginally controlled. Will monitor      at this time.  4.  Nonischemic cardiomyopathy. Accurate I&O. Discontinue IV fluids when      patient is taking p.o.   Thank you for allowing me to participate in the care of your patient. Please  call me should you have further questions.      Meade Maw, M.D.  Electronically Signed     HP/MEDQ  D:  06/26/2005  T:  06/27/2005  Job:  [id number]

## 2010-08-01 NOTE — Op Note (Signed)
   NAME:  Rawleigh, Rode NO.:  [id number]   MEDICAL RECORD NO.:  [id number]                   PATIENT TYPE:   LOCATION:                                       FACILITY:   PHYSICIAN:  Leonides Grills, M.D.                  DATE OF BIRTH:   DATE OF PROCEDURE:  01/24/2002  DATE OF DISCHARGE:                                 OPERATIVE REPORT   PREOPERATIVE DIAGNOSES:  Right dislocated total hip arthroplasty.   POSTOPERATIVE DIAGNOSES:  Right dislocated total hip arthroplasty.   OPERATION:  Closed reduction right total hip arthroplasty.   ANESTHESIA:  General endotracheal tube.   SURGEON:  Leonides Grills, M.D.   COMPLICATIONS:  None.   FINDINGS:  Post reduction x-rays showed a concentric reduction right total  hip arthroplasty.   DESCRIPTION OF PROCEDURE:  The patient was brought to the operating room and  placed in supine position. After adequate general anesthesia was  administered, a closed reduction was then performed in the right total knee  arthroplasty. This reduced very easily. The hip was ranged. The hip had a  tendency to slide out with flexion and adduction. We obtained the center of  reduction, placed in a knee immobilizer, obtained an x-ray. AP hip showed a  concentric reduction. The patient was stable to PR.                                                Leonides Grills, M.D.    PB/MEDQ  D:  01/24/2002  T:  01/25/2002  Job:  [id number]

## 2010-08-01 NOTE — Discharge Summary (Signed)
[name], [name]                ACCOUNT NO.:  [id number]   MEDICAL RECORD NO.:  [id number]          PATIENT TYPE:  INP   LOCATION:  1513                         [facility]ACILITY[facility]   PHYSICIAN:  Ollen Gross, M.D.    DATE OF BIRTH:  05/16/1923   DATE OF ADMISSION:  06/26/2005  DATE OF DISCHARGE:  07/01/2005                                 DISCHARGE SUMMARY   ADMISSION DIAGNOSES:  1.  Instability right total hip.  2.  Hypertension.  3.  Paroxysmal atrial fibrillation.  4.  Benign prostatic hypertrophy.   DISCHARGE DIAGNOSES:  1.  Unstable right total hip arthroplasty, status post right hip revision      with acetabular liner over to constrained liner.  2.  Postoperative atrial fibrillation with rapid ventricular rate.  3.  Hypertension.  4.  Paroxysmal atrial fibrillation.  5.  Benign prostatic hypertrophy.   PROCEDURE:  On June 26, 2005, right hip acetabular liner revision over to a  constrained liner, surgeon Dr. Lequita Halt, assistant Alexzandrew L. Julien Girt,  P.A.-C, anesthesia general.   CONSULTATIONS:  Meade Maw, M.D., cardiology.   HISTORY OF PRESENT ILLNESS:  Mr. Mcgaha is an 77 year old male with a right  total hip done several years ago.  He has had three episodes of posterior  dislocation and now presents for revision over to constrained liner.   LABORATORY DATA AND X-RAY FINDINGS:  CBC on admission with hemoglobin 12.9,  hematocrit 39.1, normal white count of 4.9.  Serial H&H's were followed.  Hemoglobin dropped to 11.5.  Last noted H&H 10.8 and 32.4.  PT/PTT preop  16.9/44 respectively, INR normal at 1.4.  Serial pro times followed with  PT/INR 23.4 and 2.1.  Chem panel on admission all within normal limits with  the exception of low albumin of 3.4.  Serial BMETs were followed and  electrolytes remained within normal limits.  Glucose went from 96, 138 back  down to 109.  Urinalysis preop negative.  Blood group type A positive.   Portable pelvis on June 26, 2005, with satisfactory acetabular revision  with right total hip prosthesis.  Right hip films on June 26, 2005, with  satisfactory appearance of right total hip.  EKG dated May 23, 2005, with  atrial fibrillation with rapid ventricular response with premature  conductive complexes meeting criteria for LVH, ST depression, consider  subendocardial injury and digitalis effect.   HOSPITAL COURSE:  The patient was admitted to [facility] and  tolerated the procedure well and went to the recovery room and then later to  the orthopedic floor.  Later that afternoon, the patient developed rapid  rate.  Heart rate suddenly got up into 140s and 150s and was given IV  medications with the heart rate decreasing to the 120s and back down into  the 60s and 70s where he stabilized after surgery.  The patient was seen in  PACU by [facility], Dr. Fraser Din.  He was rate controlled on his  current medications and required IV medications following surgery.  Rate did  improve.  He was later transferred to the medical  surgical floor for further  care.  On postop day #1, he was doing well with bowels stable.  Hemovac  drain which was placed at the time of surgery was pulled without difficulty.  He started getting up out of bed with physical therapy.  By postop day #2,  rate was in the 80s with no cardiac complaints.  Dressing was changed.  Hip  incision looked very good.  He was allowed to be up out of bed weightbearing  as tolerated.  On day #3, he was seen in rounds and was only slowly  progressing with physical therapy and felt he needed another day.  His heart  rate remained stable.  He was getting up around 50 feet, but did ambulate  later 100 feet later that afternoon.  He continued to progress well and  incision looked good.  He continued to progress with physical therapy.  He  remained stable from a cardiac standpoint.  He was ready to go home by July 01, 2005.   DISPOSITION:  The  patient is discharged home on July 01, 2005.   DISCHARGE MEDICATIONS:  Coumadin, Percocet, Robaxin.  Resume home  medications.   DIET:  As tolerated.   FOLLOW UP:  Follow up next Tuesday or Thursday following discharge on April  24, or April 26.  Call the office for an appointment at [phone].   ACTIVITY:  Weightbearing as tolerated.  Home health PT and nursing.  Hip  precautions with protocol.   CONDITION ON DISCHARGE:  Improved.      Alexzandrew L. Julien Girt, P.A.      Ollen Gross, M.D.  Electronically Signed    ALP/MEDQ  D:  08/07/2005  T:  08/08/2005  Job:  [id number]   cc:   Meade Maw, M.D.  Fax: [phone]

## 2010-08-01 NOTE — Cardiovascular Report (Signed)
[facility]  Patient:    [name], [name]                     MRN: [id number] Proc. Date: 04/26/00 Adm. Date:  [id number] Disc. Date: [id number] Attending:  Mora Appl CC:         Barbette Hair. Vaughan Basta., M.D.   Cardiac Catheterization  PROCEDURE PERFORMED:  Left heart catheterization, right heart catheterization and  single plane ventriculogram, thermodilution cardiac output.  INDICATIONS FOR PROCEDURE:  Severe diffuse hypokinesis with moderate left ventricular enlargement, ejection fraction of 30%.  Moderate mitral regurgitation with a peak right ventricle systolic pressure of 34 mmHg.  BRIEF HISTORY:  [name] is a 77 year old gentleman, who has been followed for chronic atrial fibrillation and hypertension.  He was evaluated by Dr. Merril Abbe on January 6 for increasing shortness of breath.  A transthoracic echocardiogram was performed with findings as noted above.  The patient was initiated on Altace and Lasix and subsequently has his shortness of breath has significantly improved.  DESCRIPTION OF PROCEDURE:  After obtaining written informed consent, the patient was brought to the cardiac catheterization lab in the postabsorptive state.  Preoperative sedation was achieved using with IV Versed.  The right groin was prepped and draped using sterile technique.  Local anesthesia was achieved using 1% Xylocaine.  A 6 French hemostasis sheath was placed into the right femoral artery using modified Seldinger technique.  An 8 [place] hemostasis sheath was placed into the right femoral vein using modified Seldinger technique.  Right heart catheterization was subsequently performed.  Right atrial mean was 5, right ventricle pressure was 33/4, PA pressure was 39/16.  His wedge was 22.  LV pressure is 163/16.  Aortic pressure is 164/97.  His cardiac output by Fick was 3.6.  His cardiac output by thermodilution was 3.2.  His O2 saturations were  as follows:  O2 saturation from the aortic was 97%, from the pulmonary artery was 63%, from the SVC was 66%, from the right atrial was 71%, from the IVC was 67%.  There was no significant stepup noted.  Cardiac catheterization:  Single plane ventriculogram revealed diffuse hypokinesis.  The ejection fraction is approximately 35%.  The left ventricle is enlarged.  There is mild mitral regurgitation noted.  CORONARY ANGIOGRAPHY:  Left main:  The left main coronary artery bifurcated into a large left anterior descending and circumflex vessel.  There was no significant disease in the left main coronary artery.  Left anterior descending:  Left anterior descending gave rise to multiple diagonals, approximately three to four small to moderate diagonal.  There was luminal irregularities in the circumflex vessel of up to approximately 30%.  Circumflex vessel:  The circumflex vessel was a small to moderate branch. There was luminal irregularities in the circumflex vessel of up to 30-40% prior to the first obtuse marginal.  Right coronary artery:  The right coronary artery was a very large, dominant artery.  It gave rise to a large PDA and PL branch as well as multiple posterolateral branches.  There was no significant disease in the right coronary artery.  IMPRESSION: 1. Nonischemic dilated cardiomyopathy, ejection fraction at approximately    30%.  Cardiomyopathy is most likely related to hypertension. 2. Mild pulmonary hypertension.  The mitral regurgitation noted has not    resulted in secondary pulmonary hypertension. 3. Chronic atrial fibrillation.  RECOMMENDATIONS:  To continue with a diuretic and beta blockers for management of his blood pressure.  Recommend chronic anticoagulation for his atrial fibrillation.  The patient will be enrolled in the congestive heart failure clinic for further management. DD:  04/26/00 TD:  04/27/00 Job: [id number]

## 2010-08-01 NOTE — Op Note (Signed)
NAMEJERMAYNE, [name]                ACCOUNT NO.:  [id number]   MEDICAL RECORD NO.:  [id number]          PATIENT TYPE:  INP   LOCATION:  1513                         FACILITY:  [facility]   PHYSICIAN:  Ollen Gross, M.D.    DATE OF BIRTH:  January 23, 1924   DATE OF PROCEDURE:  06/26/2005  DATE OF DISCHARGE:                                 OPERATIVE REPORT   PREOPERATIVE DIAGNOSIS:  Unstable right total hip arthroplasty.   POSTOPERATIVE DIAGNOSIS:  Unstable right total hip arthroplasty.   PROCEDURE:  Right hip acetabular liner revision to constrain liner.   SURGEON:  Ollen Gross, M.D.   ASSISTANT:  Alexzandrew L. Julien Girt, P.A.   ANESTHESIA:  General.   ESTIMATED BLOOD LOSS:  200 mL   DRAINS:  Hemovac x1.   COMPLICATIONS:  None.   CONDITION:  Stable to recovery.   BRIEF CLINICAL NOTE:  [name] is an 77 year old male who had a right  total hip arthroplasty done several years ago.  He has had 3 episodes of  posterior dislocation.  He presents now for revision to a constrained liner.   PROCEDURE IN DETAIL:  After successful administration of general anesthetic,  the patient is placed in left lateral decubitus position with the right side  up and held with the hip positioner.  The right lower extremity is isolated  from the perineum with plastic drapes and prepped and draped in the usual  sterile fashion.  A short posterolateral incision is made with a 10-blade  into the subcutaneous tissue to the fascia lata which was incised in line  with the skin incision.  The sciatic nerve is palpated and protected; and  the pseudocapsule excised off the posterior femur.  The hip was then  dislocated.  I had to flex from 70-90 down to 40; and internally rotate it  to about 50  to dislocate him.  I removed the femoral head which was a 32  plus 6.  We then retracted the femur anteriorly to gain acetabular closure.  I did visualize the eversion of the femur; and it was anteverted  approximately  20 degrees.  The eversion of the cup looks fine.  The  acetabular liner is removed from the acetabular shell.  It was a 60-mm  shell; and the liner was a 32-mm neutral.  The liner is removed.  The shell  was in good position and is well fixed.   I thoroughly irrigated and cleared all soft tissue off the edges.  We then  placed a 60-mm Pinnacle constrained liner into the acetabular shell and  impacted it; locking it in position.  I then placed a 36 plus 3 head onto  the femoral neck; and placed the locking ring around that.  We reduced the  hip and snapped it into place.  I then placed the locking ring and impacted  that.  I was able to flex him to about 100 and then at 70 degrees of  flexion, and 40 degrees of abduction; he had about 50 degrees internal  rotation and then he had full extension and full external  rotation.   The wound was then copiously irrigated with saline solution and the  posterior pseudocapsule reattached to femur through drill holes.  Fascia  lata was closed over Hemovac drain with interrupted #1 Vicryl, subcu closed  with #1 Vicryl and 2-0 Vicryl, and skin closed with staples.  Drains hooked  to suction.  Incision cleaned and dried; and then a bulky sterile dressing  applied.  He was placed into a knee immobilizer, awakened, and transported  to recovery in stable condition.      Ollen Gross, M.D.  Electronically Signed     FA/MEDQ  D:  06/26/2005  T:  06/26/2005  Job:  [id number]

## 2010-08-01 NOTE — Discharge Summary (Signed)
NAME:  [name], [name]                          ACCOUNT NO.:  [id number]   MEDICAL RECORD NO.:  [id number]                   PATIENT TYPE:  INP   LOCATION:  0472                                 [facility]ACILITY:  [facility]   PHYSICIAN:  Ollen Gross, MD                   DATE OF BIRTH:  [birth year]   DATE OF ADMISSION:  12/05/2001  DATE OF DISCHARGE:  12/10/2001                                 DISCHARGE SUMMARY   ADMITTING DIAGNOSES:  1. Osteoarthritis right hip.  2. Atrial fibrillation.  3. Hypertension.  4. Diverticulosis.  5. History of colon polyps with polypectomy.   DISCHARGE DIAGNOSES:  1. Osteoarthritis right hip status post right total hip replacement     arthroplasty.  2. Right wrist dorsal ganglion.  3. Atrial fibrillation.  4. Hypertension.  5. Diverticulosis.  6. History of colon polyps with polypectomy.   PROCEDURE:  The patient was taken to the OR on December 05, 2001.  Underwent a right total hip replacement arthroplasty.  Surgeon Dr. Homero Fellers  Aluisio.  Assistant Avel Peace, P.A.-C.  Surgery under general anesthesia.  Hemovac drain x1.  Estimated blood loss 350 cc.   CONSULTS:  Cardiology, Dr. Meade Maw.   BRIEF HISTORY:  The patient is a 77 year old male who has been seen in  consultation by Dr. Lequita Halt for increasing right hip pain that has been  intermittent over the past several years, however, more progressive and  constant pain over the past six months.  He has difficulty with pain and  weightbearing and has also started experiencing pain at night.  He has been  very active.  However, the pain started interfering with his daily  activities.  It is felt he would benefit from undergoing right hip  replacement.  Risks and benefits discussed and the patient subsequently  admitted to the hospital.   LABORATORY DATA:  CBC on admission:  Hemoglobin 13.5, hematocrit 40.2, white  cell count 4.8, red cell count 4.72.  Postoperative H&H 10.9 and 31.4.  Last  noted  H&H 10.1 and 29.5.  Differential on admission:  CBC all within normal  limits.  PT and PTT on admission were 15.3 and 40, respectively.  INR of  1.2.  Serial pro times followed per Coumadin protocol.  Last noted PT/INR  24.7 and 2.6.  Chemistry panel on admission all within normal limits.  Postoperative BMETs followed for two days.  Sodium did drop from 140 to 135,  last noted at 134.  Glucose went up from 84 to 156 back down to 145.  Urinalysis on admission was negative.  Blood group type A+.  EKG dated  November 29, 2001:  Atrial fibrillation, septal infarct age undetermined,  no old tracings to compare, confirmed by Dr. Jacqulyn Liner.  Chest x-ray  November 29, 2001:  Cardiomegaly, no active disease.  Right hip film showed  marked osteoarthritis of the right hip.  Postoperative hip films on  December 05, 2001:  Right hip prosthesis in appropriate position, no  evidence of fracture of dislocation.   HOSPITAL COURSE:  The patient was admitted to [facility], taken to  OR.  Underwent above stated procedure without complications.  The patient  tolerated procedure well.  Was allowed to return to the recovery room and  then to the orthopedic floor for continued postoperative care.  Vital signs  were followed.  The patient was given 24 hours of postoperative IV  antibiotics.  Cardiology consult was called.  The patient was seen in  consultation by Dr. Meade Maw and evaluated from a cardiology standpoint  due to history.  He was stable from a cardiac standpoint following surgery.  Hemovac drain which was placed at time of surgery was pulled on  postoperative day one.  Laboratories were followed very closely.  The  patient was placed on Coumadin for DVT prophylaxis.  He had been on Coumadin  preoperatively for his atrial fibrillation.  He was also placed on Lovenox  for coverage until his INR was therapeutic on the Coumadin.  On  postoperative day two patient was doing quite well  with his pain.  His  biggest complaint was right wrist pain.  He was found to have a dorsal wrist  ganglion.  Other than that he was doing very well from his hip procedure.  He remained stable from the cardiac standpoint.  His PCA which was placed at  time of surgery which he was weaned off of by postoperative day two.  He was  given Celebrex for pain on postoperative day three.  Initially following  surgery he was actually moved to a telemetry floor.  By postoperative day  three the telemetry was discontinued, then he was moved out to the  orthopedic floor.  He had been monitored on telemetry postoperatively and  had remained stable.  Continued to improve and by postoperative day four  patient had been progressing with physical therapy which was consulted  postoperatively.  He was up ambulating approximately 40 feet by  postoperative day three and continued to improve with mobility.  Discharge  planning was consulted to assist with post hospital care with arrangements  for home health PT, home health nursing.  Arrangements were made through  [facility].  Also, arrangements were made for his PT and INR to be  followed by his cardiologist.  Once all arrangements had been made the  patient was seen and evaluated on December 10, 2001, doing quite well.  INR  was therapeutic.  He had been weaned over to p.o. analgesics and was  discharged home.   DISCHARGE PLAN:  The patient discharged home on December 10, 2001.   DISCHARGE DIAGNOSES:  Please see above.   DISCHARGE MEDICATIONS:  1. Coumadin.  2. Percocet.  3. Robaxin.   DIET:  Cardiac diet.   ACTIVITY:  Touchdown weightbearing.  Home health PT, home health nursing for  gait training ambulation.  Pro times through [facility].   FOLLOW UP:  Two weeks from the surgery.  Call the office for an appointment.  Follow up with Dr. Meade Maw, cardiology, two to three weeks after  discharge.  Daily dressing  change.  DISPOSITION:  Home.   CONDITION ON DISCHARGE:  Improved.     Alexzandrew L. Julien Girt, P.A.              Ollen Gross, MD    ALP/MEDQ  D:  01/04/2002  T:  01/04/2002  Job:  [id number]

## 2010-08-01 NOTE — H&P (Signed)
[name], [name] NO.:  [id number]   MEDICAL RECORD NO.:  [id number]          PATIENT TYPE:  INP   LOCATION:  1824                         FACILITY:  [facility]   PHYSICIAN:  Leonides Grills, M.D.     DATE OF BIRTH:  12/19/1923   DATE OF ADMISSION:  12/03/2004  DATE OF DISCHARGE:                                HISTORY & PHYSICAL   He is a 77 year old male that was sitting on his bed and reached to answer  the telephone, slid off into the floor and had immediate right hip pain.   ADMISSION DIAGNOSIS:  Right prosthetic hip dislocation.   PAST MEDICAL HISTORY:  Hypertension, diverticulitis, atrial fibrillation.   PAST SURGICAL HISTORY:  Bowel surgery for the diverticulitis and right total  hip replacement and this is his second dislocation of the right hip.   MEDICATIONS:  1.  Toprol.  2.  Accupril.  3.  Coumadin.  4.  Robaxin p.r.n.  5.  Hydrochlorothiazide.   ALLERGIES:  No known drug allergies.   PHYSICAL EXAMINATION:  VITAL SIGNS:  Temperature 98.7, pulse 80,  respirations 22, blood pressure 210/100.  GENERAL:  He is well-developed, well-nourished, in no acute distress.  HEENT:  Pupils equal, round, and reactive to light.  NECK:  Supple, nontender to palpation.  CHEST:  Clear to auscultation bilaterally.  HEART:  Regular rate and rhythm, no murmurs.  ABDOMEN:  Soft, nontender to palpation.  EXTREMITIES:  His right hip is internally rotated and slightly shortened.  There is no ecchymosis or erythema.  No openings in the skin.   REVIEW OF SYSTEMS:  He has no migraines, no night sweats, no fever or  chills.  No difficulty with speech or hearing.  No swallowing difficulty.  No hemoptysis.  No bowel or bladder incontinence.  He does have  diverticulitis.  Otherwise negative.  X-rays were taken and shows a right  prosthetic hip dislocation.   IMPRESSION:  Right hip prosthetic dislocation.   PLAN:  Closed reduction of right hip by Leonides Grills, M.D.  We will  then  discharge him home with some Vicodin 5/500 p.r.n. for pain to continue all  home medications prior to reduction and he is going to call the office and  follow up with Ollen Gross, M.D. who actually did his hip replacement and  he will be weightbearing as tolerated with the knee immobilizer on at all  times.      Lianne Cure, P.A.      Leonides Grills, M.D.  Electronically Signed    MC/MEDQ  D:  12/03/2004  T:  12/04/2004  Job:  [id number]

## 2010-08-01 NOTE — H&P (Signed)
NAME:  [name], [name] NO.:  [id number]   MEDICAL RECORD NO.:  [id number]          PATIENT TYPE:  INP   LOCATION:  0008                         [facility]ACILITY:  [facility]   PHYSICIAN:  Ollen Gross, M.D.    DATE OF BIRTH:  05/05/1923   DATE OF ADMISSION:  06/26/2005  DATE OF DISCHARGE:                                HISTORY & PHYSICAL   NOTATION:  Date of office visit history and physical was June 16, 2005.   CHIEF COMPLAINT:  Right hip dislocations.   HISTORY:  This 77 year old male patient, well known to Dr. Ollen Gross,  having undergone a previous total hip in the past.  Unfortunately he has  gone on to have recurrent dislocations and an unstable hip.  This has been  discussed with him at length and it is felt that he would best be served by  undergoing revision surgery with changing out of the liner hip, versus a  possible constrained liner.  The risks and benefits have been discussed with  the patient.  He was subsequently admitted to the hospital.   ALLERGIES:  No known drug allergies.   CURRENT MEDICATIONS:  1.  Warfarin.  2.  Digitek.  3.  Toprol XL.  4.  Quinapril.   PAST MEDICAL HISTORY:  1.  Hypertension.  2.  History of paroxysmal atrial fibrillation.  3.  Benign prostatic hypertrophy.  4.  History of arthritis.   PAST SURGICAL HISTORY:  1.  Colon surgery in 1982.  2.  Right total hip replacement in 2003.   SOCIAL HISTORY:  Widowed, retired.  Three children.  A daughter will be  helping after the surgery.   FAMILY HISTORY:  Hypertension and arthritis.   REVIEW OF SYSTEMS:  GENERAL:  No fevers, chills or night sweats.  NEUROLOGIC:  No seizures or paralysis.  RESPIRATORY:  No shortness of  breath, productive cough or hemoptysis.  CARDIOVASCULAR:  No chest pain,  angina or orthopnea.  GI:  No nausea, vomiting, diarrhea or constipation.  GENITOURINARY:  Some weak stream and nocturia.  No dysuria or hematuria.  MUSCULOSKELETAL:  Right hip.   PHYSICAL EXAMINATION:  VITAL SIGNS:  Pulse 74, respirations 14, blood  pressure 148/80.  GENERAL:  A 77 year old white male, well-developed and well-nourished, in no  acute distress.  He is a good historian.  He is alert, oriented,  cooperative, pleasant.  HEENT:  Normocephalic and atraumatic.  Pupils round, reactive.  Oropharynx  clear.  Tympanic membranes intact.  He does have bilateral hearing aids.  Full upper and lower dentures noted.  NECK:  Supple.  CHEST:  Clear anterior and posterior chest.  No rales, rhonchi or wheezing.  HEART:  An irregular rhythm.  S1 and S2 noted with ectopic beats.  No  murmurs appreciated.  ABDOMEN:  Soft, nontender.  Bowel sounds present.  RECTAL/BREASTS/GENITOURINARY:  Not done, not pertinent to the present  illness.  EXTREMITIES:  Right hip:  Able to flex the right hip to about 110 degrees,  internal rotation of 20, external rotation of 30, abduction of 30.  No pain  on  passive range of motion.   IMPRESSION:  1.  Instability, right total hip.  2.  Hypertension.  3.  Paroxysmal atrial fibrillation.  4.  Benign prostatic hypertrophy.   PLAN:  The patient is admitted to [facility] to undergo a right  hip liner revision, versus possible straight liner.  The surgery will be  performed by Dr. Ollen Gross.  Dr. Meade Maw is his cardiologist.  She  will be notified postoperatively and asked to assist with postoperative  management of the patient.      [name], P.A.      Ollen Gross, M.D.  Electronically Signed    ALP/MEDQ  D:  06/26/2005  T:  06/26/2005  Job:  [id number]   cc:   Meade Maw, M.D.  Fax: [phone]   Dr. Olena Leatherwood

## 2010-08-01 NOTE — Consult Note (Signed)
NAME:  [name], SCHUELLER                          ACCOUNT NO.:  [id number]   MEDICAL RECORD NO.:  [id number]                   PATIENT TYPE:  INP   LOCATION:  0347                                 FACILITY:  [facility]   PHYSICIAN:  Meade Maw, M.D.                 DATE OF BIRTH:  1923/07/29   DATE OF CONSULTATION:  DATE OF DISCHARGE:                                   CONSULTATION   INDICATIONS FOR CONSULTATION:  Atrial fibrillation with rapid ventricular  response.   BRIEF HISTORY:  The patient is a 77 year old gentleman known to me in the  past for nonischemic cardiomyopathy. His last cardiac workup was performed  in February of 2002 and he underwent coronary angiography which revealed no  significant coronary artery disease. His ejection fraction was noted to be  35% with mild mitral regurgitation. He has had a transthoracic echo  performed which revealed mild aortic sclerosis and mild right ventricular  enlargement. He underwent a right total hip arthroplasty today. In the  postop period, he was noted to have atrial fibrillation with a ventricular  response of 130 beats per minute. He was initiated on Beta block, this has  subsequently been discontinued. He currently is awake and talking and is in  no respiratory distress. His chief complaint is that of right hip pain.   PAST MEDICAL HISTORY:  1. Significant for nonischemic cardiomyopathy as noted above.  2. Chronic atrial fibrillation.  3. Chronic anticoagulation.   MEDICATIONS PRIOR TO ADMISSION:  1. Accupril 40 mg daily.  2. Celebrex 200 mg daily.  3. Coumadin adjusted to maintain an INR or 2-3, Coumadin was held prior to     surgery.  4. Toprol 50 mg daily.   ALLERGIES:  Has no known drug allergies.   PAST SURGICAL HISTORY:  Significant for colonoscopy.   SOCIAL HISTORY:  He is widowed and has three children. Negative tobacco use.   FAMILY HISTORY:  Noncontributory.   REVIEW OF SYMPTOMS:  Currently complains of right  hip pain.   PHYSICAL EXAMINATION:  GENERAL:  Reveals an elderly male in no acute  distress. He is awake, alert, conversation is appropriate. VITAL SIGNS:  Systolic blood pressure is 146/92, heart rate 100-120s, he is afebrile.  HEENT:  No evidence of neck vein distention. PULMONARY:  Reveals breath  sounds which are equal, there are mild crackles noted at the bases.  CARDIOVASCULAR:  Reveals an irregularly irregular rhythm. There are no  murmurs noted. ABDOMEN:  Soft, nontender. EXTREMITIES:  Reveal no peripheral  edema.   LABORATORY DATA:  His sodium was 146, potassium 4.7, creatinine of 0.9. LFTs  are within normal limits. His hematocrit is 40, platelet count is 157.   ECG reveals atrial fibrillation with a ventricular response of 97, there was  no ECG performed today.   IMPRESSION:  1. Chronic atrial fibrillation. He currently is not well with rate control.  Will proceed with Lopressor IV followed by his Toprol p.o.  Continue to     use Lopressor intermittently as needed for better rate control.  2. Nonischemic cardiomyopathy, no evidence of decompensation. Reinstate the     Accupril at 40 mg daily, incentive spirometer has been ordered.  3. Chronic anticoagulation. Initiate Lovenox until his INR is therapeutic.   Thank you for allowing me to participate in the care of this pleasant  gentleman.                                               Meade Maw, M.D.    HP/MEDQ  D:  12/05/2001  T:  12/06/2001  Job:  [id number]

## 2010-08-01 NOTE — Op Note (Signed)
NAMESEYDOU, HEARNS NO.:  [id number]   MEDICAL RECORD NO.:  [id number]          PATIENT TYPE:  EMS   LOCATION:  ED                           FACILITY:  [facility]   PHYSICIAN:  Jene Every, M.D.    DATE OF BIRTH:  1923-09-28   DATE OF PROCEDURE:  05/23/2005  DATE OF DISCHARGE:  05/23/2005                                 OPERATIVE REPORT   PREOPERATIVE DIAGNOSIS:  Dislocated right total hip replacement.   POSTOPERATIVE DIAGNOSIS:  Dislocated right total hip replacement.   PROCEDURE:  Closed reduction under anesthesia right total hip.   ANESTHESIA:  General.   ASSISTANT:  None.   BRIEF HISTORY AND INDICATIONS:  An 77 year old with a history of a total hip  replacement status post a postoperative dislocation who bent over this  morning, redislocated his hip, seen in the emergency room, confirmed the  dislocation, external rotation, shortening of the extremity. He is  neurovascularly intact, no disassociation of the components noted. It was  indicated for closed reduction. The risks and benefits were discussed  including the possibility of open reduction.   TECHNIQUE:  The patient in supine position after the induction of adequate  general anesthesia. A reduction maneuver was performed with gentle  longitudinal traction, flexion of the hip combined with internal rotation. A  reduction was appreciated with restoration of the leg lengths. Good pulses  noted. He had stable hip throughout range of motion of the flexion 90  degrees, external rotation to 30 degrees,  internal rotation to 30 degrees  as well. Also in full extension. Post reduction radiographs were  satisfactory and demonstrated reduction of the femoral head in the  acetabulum. There is no evidence of fracture disassociation in the  prosthesis.   The patient was then placed in the mobilizer, extubated without difficulty  and transported to the recovery room in satisfactory condition.   The patient  tolerated the procedure well and there was no complications.      Jene Every, M.D.  Electronically Signed     JB/MEDQ  D:  05/23/2005  T:  05/25/2005  Job:  [id number]

## 2011-01-27 ENCOUNTER — Other Ambulatory Visit (INDEPENDENT_AMBULATORY_CARE_PROVIDER_SITE_OTHER): Payer: Medicare Other | Admitting: Vascular Surgery

## 2011-01-27 DIAGNOSIS — I6529 Occlusion and stenosis of unspecified carotid artery: Secondary | ICD-10-CM

## 2011-01-27 DIAGNOSIS — Z48812 Encounter for surgical aftercare following surgery on the circulatory system: Secondary | ICD-10-CM

## 2011-01-27 NOTE — Progress Notes (Deleted)
Carotid duplex F/U @ [facility] 01/27/2011

## 2011-02-10 NOTE — Procedures (Unsigned)
CAROTID DUPLEX EXAM  INDICATION:  Carotid stenosis  HISTORY: Diabetes:  No Cardiac:  Arrhythmia Hypertension:  Yes Smoking:  Previous Previous Surgery:  Right carotid endarterectomy on 12/02/2009 CV History:  Vision disturbance of right eye Amaurosis Fugax No, Paresthesias No, Hemiparesis No                                      RIGHT             LEFT Brachial systolic pressure:         152               156 Brachial Doppler waveforms:         WNL               WNL Vertebral direction of flow:        Antegrade         Antegrade DUPLEX VELOCITIES (cm/sec) CCA peak systolic                   74                120 ECA peak systolic                   97                110 ICA peak systolic                   43                63 ICA end diastolic                   12                21 PLAQUE MORPHOLOGY:                  Heterogeneous     Heterogeneous PLAQUE AMOUNT:                      Mild              Mild PLAQUE LOCATION:                    CCA               CCA/ICA  IMPRESSION: 1. Bilateral common carotid artery disease present. 2. Right internal carotid artery is patent with history of     endarterectomy, no hyperplasia or hemodynamic changes were     visualized. 3. Left internal carotid artery stenosis present in the 1%-39% range. 4. Bilateral external carotid arteries are patent. 5. Bilateral vertebral arteries are patent and antegrade. 6. Essentially unchanged since previous study on 07/22/2010.  ___________________________________________ Larina Earthly, M.D.  SH/MEDQ  D:  01/27/2011  T:  01/27/2011  Job:  [id number]

## 2011-10-02 ENCOUNTER — Encounter: Payer: Self-pay | Admitting: Vascular Surgery

## 2012-02-24 ENCOUNTER — Observation Stay (HOSPITAL_COMMUNITY)
Admission: EM | Admit: 2012-02-24 | Discharge: 2012-02-29 | Disposition: A | Discharge status: Home / Self Care | Payer: Medicare Other | Attending: Internal Medicine | Admitting: Internal Medicine

## 2012-02-24 ENCOUNTER — Emergency Department (HOSPITAL_COMMUNITY): Payer: Medicare Other

## 2012-02-24 ENCOUNTER — Encounter (HOSPITAL_COMMUNITY): Payer: Self-pay | Admitting: Emergency Medicine

## 2012-02-24 ENCOUNTER — Observation Stay (HOSPITAL_COMMUNITY): Payer: Medicare Other

## 2012-02-24 DIAGNOSIS — M25569 Pain in unspecified knee: Principal | ICD-10-CM

## 2012-02-24 DIAGNOSIS — M7989 Other specified soft tissue disorders: Secondary | ICD-10-CM

## 2012-02-24 DIAGNOSIS — I10 Essential (primary) hypertension: Secondary | ICD-10-CM | POA: Diagnosis present

## 2012-02-24 DIAGNOSIS — L0390 Cellulitis, unspecified: Secondary | ICD-10-CM

## 2012-02-24 DIAGNOSIS — N183 Chronic kidney disease, stage 3 unspecified: Secondary | ICD-10-CM | POA: Diagnosis present

## 2012-02-24 DIAGNOSIS — R0989 Other specified symptoms and signs involving the circulatory and respiratory systems: Secondary | ICD-10-CM | POA: Insufficient documentation

## 2012-02-24 DIAGNOSIS — N179 Acute kidney failure, unspecified: Secondary | ICD-10-CM

## 2012-02-24 DIAGNOSIS — D649 Anemia, unspecified: Secondary | ICD-10-CM

## 2012-02-24 DIAGNOSIS — I4891 Unspecified atrial fibrillation: Secondary | ICD-10-CM

## 2012-02-24 DIAGNOSIS — M25561 Pain in right knee: Secondary | ICD-10-CM

## 2012-02-24 DIAGNOSIS — M79609 Pain in unspecified limb: Secondary | ICD-10-CM

## 2012-02-24 DIAGNOSIS — D539 Nutritional anemia, unspecified: Secondary | ICD-10-CM | POA: Diagnosis present

## 2012-02-24 HISTORY — DX: Unspecified osteoarthritis, unspecified site: M19.90

## 2012-02-24 HISTORY — DX: Pain in right knee: M25.561

## 2012-02-24 HISTORY — DX: Essential (primary) hypertension: I10

## 2012-02-24 HISTORY — DX: Diverticulosis of large intestine without perforation or abscess without bleeding: K57.30

## 2012-02-24 HISTORY — DX: Unspecified atrial fibrillation: I48.91

## 2012-02-24 HISTORY — DX: Unspecified malignant neoplasm of skin of unspecified part of face: C44.300

## 2012-02-24 HISTORY — DX: Benign prostatic hyperplasia without lower urinary tract symptoms: N40.0

## 2012-02-24 LAB — CBC WITH DIFFERENTIAL/PLATELET
Basophils Relative: 0 % (ref 0–1)
Eosinophils Absolute: 0 10*3/uL (ref 0.0–0.7)
Eosinophils Relative: 0 % (ref 0–5)
HCT: 32.5 % — ABNORMAL LOW (ref 39.0–52.0)
Hemoglobin: 10.6 g/dL — ABNORMAL LOW (ref 13.0–17.0)
MCH: 27 pg (ref 26.0–34.0)
MCHC: 32.6 g/dL (ref 30.0–36.0)
Monocytes Absolute: 0.8 10*3/uL (ref 0.1–1.0)
Monocytes Relative: 10 % (ref 3–12)

## 2012-02-24 LAB — BASIC METABOLIC PANEL
BUN: 48 mg/dL — ABNORMAL HIGH (ref 6–23)
BUN: 44 mg/dL — ABNORMAL HIGH (ref 6–23)
Calcium: 9.9 mg/dL (ref 8.4–10.5)
Calcium: 9.6 mg/dL (ref 8.4–10.5)
Creatinine, Ser: 1.65 mg/dL — ABNORMAL HIGH (ref 0.50–1.35)
GFR calc Af Amer: 42 mL/min — ABNORMAL LOW (ref >90)
GFR calc non Af Amer: 36 mL/min — ABNORMAL LOW (ref >90)
GFR calc non Af Amer: 42 mL/min — ABNORMAL LOW (ref >90)
Glucose, Bld: 123 mg/dL — ABNORMAL HIGH (ref 70–99)
Potassium: 4.4 mEq/L (ref 3.5–5.1)

## 2012-02-24 MED ORDER — ATENOLOL 100 MG PO TABS
100.0000 mg | ORAL_TABLET | Freq: Every day | ORAL | Status: DC
  Administered 2012-02-25 – 2012-02-27 (×3): 100 mg via ORAL
  Filled 2012-02-24 (×3): qty 1

## 2012-02-24 MED ORDER — SODIUM CHLORIDE 0.9 % IV SOLN
INTRAVENOUS | Status: DC
  Administered 2012-02-24: 23:00:00 via INTRAVENOUS

## 2012-02-24 MED ORDER — ACETAMINOPHEN 650 MG RE SUPP: 650.0000 mg | Freq: Four times a day (QID) | RECTAL | Status: DC | PRN

## 2012-02-24 MED ORDER — LISINOPRIL 40 MG PO TABS
40.0000 mg | ORAL_TABLET | Freq: Every day | ORAL | Status: DC
  Administered 2012-02-25 – 2012-02-29 (×5): 40 mg via ORAL
  Filled 2012-02-24 (×5): qty 1

## 2012-02-24 MED ORDER — ACETAMINOPHEN 325 MG PO TABS: 650.0000 mg | ORAL_TABLET | Freq: Four times a day (QID) | ORAL | Status: DC | PRN

## 2012-02-24 MED ORDER — VANCOMYCIN HCL IN DEXTROSE 1-5 GM/200ML-% IV SOLN
1000.0000 mg | INTRAVENOUS | Status: DC
  Filled 2012-02-24: qty 200

## 2012-02-24 MED ORDER — OXYCODONE HCL 5 MG PO TABS
5.0000 mg | ORAL_TABLET | ORAL | Status: DC | PRN
  Administered 2012-02-25 (×2): 5 mg via ORAL
  Filled 2012-02-24 (×3): qty 1

## 2012-02-24 MED ORDER — ONDANSETRON HCL 4 MG PO TABS: 4.0000 mg | ORAL_TABLET | Freq: Four times a day (QID) | ORAL | Status: DC | PRN

## 2012-02-24 MED ORDER — SODIUM CHLORIDE 0.9 % IV SOLN
Freq: Once | INTRAVENOUS | Status: AC
  Administered 2012-02-24: 22:00:00 via INTRAVENOUS

## 2012-02-24 MED ORDER — ONDANSETRON HCL 4 MG/2ML IJ SOLN: 4.0000 mg | Freq: Four times a day (QID) | INTRAMUSCULAR | Status: DC | PRN

## 2012-02-24 MED ORDER — DIGOXIN 250 MCG PO TABS
0.2500 mg | ORAL_TABLET | Freq: Every day | ORAL | Status: DC
  Administered 2012-02-25 – 2012-02-29 (×5): 0.25 mg via ORAL
  Filled 2012-02-24 (×5): qty 1

## 2012-02-24 MED ORDER — WARFARIN SODIUM 5 MG PO TABS
5.0000 mg | ORAL_TABLET | Freq: Every day | ORAL | Status: DC
  Administered 2012-02-25 – 2012-02-27 (×3): 5 mg via ORAL
  Filled 2012-02-24 (×4): qty 1

## 2012-02-24 MED ORDER — VANCOMYCIN HCL IN DEXTROSE 1-5 GM/200ML-% IV SOLN
1000.0000 mg | Freq: Once | INTRAVENOUS | Status: AC
  Administered 2012-02-24: 1000 mg via INTRAVENOUS
  Filled 2012-02-24: qty 200

## 2012-02-24 MED ORDER — AMLODIPINE BESYLATE 5 MG PO TABS
5.0000 mg | ORAL_TABLET | Freq: Every day | ORAL | Status: DC
  Administered 2012-02-25 – 2012-02-29 (×5): 5 mg via ORAL
  Filled 2012-02-24 (×5): qty 1

## 2012-02-24 MED ORDER — WARFARIN - PHARMACIST DOSING INPATIENT: Freq: Every day | Status: DC

## 2012-02-24 MED ORDER — TAMSULOSIN HCL 0.4 MG PO CAPS
0.4000 mg | ORAL_CAPSULE | Freq: Every day | ORAL | Status: DC
  Administered 2012-02-25 – 2012-02-29 (×5): 0.4 mg via ORAL
  Filled 2012-02-24 (×5): qty 1

## 2012-02-24 MED ORDER — SODIUM CHLORIDE 0.9 % IJ SOLN
3.0000 mL | Freq: Two times a day (BID) | INTRAMUSCULAR | Status: DC
  Administered 2012-02-25 – 2012-02-29 (×9): 3 mL via INTRAVENOUS

## 2012-02-24 NOTE — ED Provider Notes (Signed)
After consent was obtained, using sterile technique the lateral R knee was prepped and plain Lidocaine 1% with epi was used as local anesthetic. The joint was entered and 0 ml's fluid was withdrawn.    The procedure was well tolerated.  Minimal bleeding. The patient is asked to continue to rest the joint for a few more days before resuming regular activities.  Watch for fever, or increased swelling or persistent pain in the joint.   Suspect prepatellar bursitis and secondary cellulitis. Will consult [facility] for admission/   Loren Racer, MD 02/24/12 2150

## 2012-02-24 NOTE — H&P (Signed)
[name] is an 78 y.o. male.   Patient was seen and examined on February 24, 2012. PCP - Dr. Renford Dills. Chief Complaint: Right knee pain and right leg swelling. HPI: 78 year-old male with history of hypertension and atrial fibrillation on Coumadin started noticing right leg swelling with pain for last 3-4 days. Patient states that his symptoms started after he tried to get out of a truck and may have turned his knee. Since then he started experiencing pain and gradual swelling of his leg. He had gone to his PCP who had referred him to the ER with concerns of possible DVT. Dopplers were negative for DVT. X-ray of the knee just shows prepatellar swelling. ER physician tried to do an arthrocentesis with no fluid yield. Patient will be placed on antibiotics for possible cellulitis and has been admitted for further management. Patient otherwise denies any fever chills.  Past Medical History  Diagnosis Date  . Hypertension   . Diverticula of colon   . Atrial fibrillation     Past Surgical History  Procedure Date  . Colon resection   . Hernia repair   . Right hip     History reviewed. No pertinent family history. Social History:  reports that he has never smoked. He does not have any smokeless tobacco history on file. He reports that he does not drink alcohol or use illicit drugs.  Allergies: No Known Allergies   (Not in a hospital admission)  Results for orders placed during the hospital encounter of 02/24/12 (from the past 48 hour(s))  CBC WITH DIFFERENTIAL     Status: Abnormal   Collection Time   02/24/12  1:57 PM      Component Value Range Comment   WBC 8.2  4.0 - 10.5 K/uL    RBC 3.92 (*) 4.22 - 5.81 MIL/uL    Hemoglobin 10.6 (*) 13.0 - 17.0 g/dL    HCT 16.1 (*) 09.6 - 52.0 %    MCV 82.9  78.0 - 100.0 fL    MCH 27.0  26.0 - 34.0 pg    MCHC 32.6  30.0 - 36.0 g/dL    RDW 04.5 (*) 40.9 - 15.5 %    Platelets 150  150 - 400 K/uL    Neutrophils Relative 78 (*) 43 - 77 %     Neutro Abs 6.4  1.7 - 7.7 K/uL    Lymphocytes Relative 13  12 - 46 %    Lymphs Abs 1.0  0.7 - 4.0 K/uL    Monocytes Relative 10  3 - 12 %    Monocytes Absolute 0.8  0.1 - 1.0 K/uL    Eosinophils Relative 0  0 - 5 %    Eosinophils Absolute 0.0  0.0 - 0.7 K/uL    Basophils Relative 0  0 - 1 %    Basophils Absolute 0.0  0.0 - 0.1 K/uL   BASIC METABOLIC PANEL     Status: Abnormal   Collection Time   02/24/12  1:57 PM      Component Value Range Comment   Sodium 136  135 - 145 mEq/L    Potassium 5.8 (*) 3.5 - 5.1 mEq/L    Chloride 103  96 - 112 mEq/L    CO2 21  19 - 32 mEq/L    Glucose, Bld 113 (*) 70 - 99 mg/dL    BUN 48 (*) 6 - 23 mg/dL    Creatinine, Ser 8.11 (*) 0.50 - 1.35 mg/dL  Calcium 9.9  8.4 - 10.5 mg/dL    GFR calc non Af Amer 36 (*) >90 mL/min    GFR calc Af Amer 42 (*) >90 mL/min   PROTIME-INR     Status: Abnormal   Collection Time   02/24/12  1:57 PM      Component Value Range Comment   Prothrombin Time 23.3 (*) 11.6 - 15.2 seconds    INR 2.18 (*) 0.00 - 1.49   BASIC METABOLIC PANEL     Status: Abnormal   Collection Time   02/24/12  9:07 PM      Component Value Range Comment   Sodium 139  135 - 145 mEq/L    Potassium 4.4  3.5 - 5.1 mEq/L DELTA CHECK NOTED   Chloride 105  96 - 112 mEq/L    CO2 22  19 - 32 mEq/L    Glucose, Bld 123 (*) 70 - 99 mg/dL    BUN 44 (*) 6 - 23 mg/dL    Creatinine, Ser 3.24 (*) 0.50 - 1.35 mg/dL    Calcium 9.6  8.4 - 40.1 mg/dL    GFR calc non Af Amer 42 (*) >90 mL/min    GFR calc Af Amer 49 (*) >90 mL/min    Dg Knee Complete 4 Views Right  02/24/2012  *RADIOLOGY REPORT*  Clinical Data: Pain post injury 4 days ago  RIGHT KNEE - COMPLETE 4+ VIEW  Comparison: None.  Findings: Four views of the right knee submitted.  No acute fracture or subluxation.  The diffuse osteopenia.  Mild prepatellar soft tissue swelling.  IMPRESSION: No acute fracture or subluxation.  Diffuse osteopenia.  Mild prepatellar soft tissue swelling.   Original Report  Authenticated By: Natasha Mead, M.D.     Review of Systems  Constitutional: Negative.   HENT: Negative.   Eyes: Negative.   Respiratory: Negative.   Cardiovascular: Negative.   Gastrointestinal: Negative.   Genitourinary: Negative.   Musculoskeletal:       Right knee pain and leg swelling.  Skin: Negative.   Neurological: Negative.   Endo/Heme/Allergies: Negative.   Psychiatric/Behavioral: Negative.     Blood pressure 148/71, pulse 60, temperature 96.8 F (36 C), temperature source Oral, resp. rate 19, SpO2 96.00%. Physical Exam  Constitutional: He is oriented to person, place, and time. He appears well-developed and well-nourished. No distress.  HENT:  Head: Normocephalic and atraumatic.  Right Ear: External ear normal.  Left Ear: External ear normal.  Nose: Nose normal.  Mouth/Throat: Oropharynx is clear and moist. No oropharyngeal exudate.  Eyes: Conjunctivae normal are normal. Pupils are equal, round, and reactive to light. Right eye exhibits no discharge. Left eye exhibits no discharge. No scleral icterus.  Neck: Normal range of motion. Neck supple.  Cardiovascular: Normal rate.   Respiratory: Effort normal and breath sounds normal. No respiratory distress. He has no wheezes. He has no rales.  GI: Soft. Bowel sounds are normal. He exhibits no distension. There is no tenderness. There is no rebound.  Musculoskeletal:       Right knee mildly swollen. Right leg swollen down to foot.  Neurological: He is alert and oriented to person, place, and time.       Moves all extremities.  Skin: He is not diaphoretic.  Psychiatric: His behavior is normal.     Assessment/Plan #1. Right knee pain with right leg swelling - patient does have significant pain on putting pressure on the leg and also has significant swelling of his leg area. At this  time I have ordered an MRI of the knee and leg to look for any ligament tears of fractures or any hematoma. Continue antibiotics for now. #2.  Atrial fibrillation presently rate controlled - continue Coumadin per pharmacy and present medications. #3. Hypertension - continue present medications. #4. Acute renal failure - patient's creatinine is mildly elevated. Patient is placed on gentle hydration. Check metabolic panel in a.m. with close followup of intake output. #5. Anemia - closely follow CBC is no significant fall in hemoglobin and further workup as outpatient. See #1.  CODE STATUS - full code.  Patient is admitted to Dr. Renford Dills services.  Eduard Clos. 02/24/2012, 10:08 PM

## 2012-02-24 NOTE — Progress Notes (Signed)
VASCULAR LAB PRELIMINARY  PRELIMINARY  PRELIMINARY  PRELIMINARY  Right lower extremity venous duplex completed.    Preliminary report:  Right:  No evidence of DVT, superficial thrombosis, or Baker's cyst.   Azaryah Heathcock, RVT 02/24/2012, 3:58 PM

## 2012-02-24 NOTE — ED Notes (Signed)
BIB [facility]. Right knee pain. Had twisted knee some days ago. Now experiencing pain from knee to ankle, swelling. EMS called out by patient due to PCP recommendation that patient seek care in ED. No CP, SOB, no other complaints. Afib Hx. No Tx during transport.

## 2012-02-24 NOTE — ED Notes (Signed)
Unilateral swelling of right knee. Patient experiences pain when right leg moved in any direction

## 2012-02-24 NOTE — Progress Notes (Signed)
ANTICOAGULATION and ANTIBIOTIC CONSULT NOTE - Initial Consult  Pharmacy Consult for warfarin, vancomycin  Indication: atrial fibrillation, and possible cellulitis  No Known Allergies  Patient Measurements: Height: 5\' 11"  (180.3 cm) Weight: 177 lb 4 oz (80.4 kg) IBW/kg (Calculated) : 75.3   Vital Signs: Temp: 96.8 F (36 C) (12/11 1238) Temp src: Oral (12/11 1238) BP: 148/71 mmHg (12/11 2158) Pulse Rate: 60  (12/11 2149)  Labs:  [facility] 02/24/12 2107 02/24/12 1357  HGB -- 10.6*  HCT -- 32.5*  PLT -- 150  APTT -- --  LABPROT -- 23.3*  INR -- 2.18*  HEPARINUNFRC -- --  CREATININE 1.45* 1.65*  CKTOTAL -- --  CKMB -- --  TROPONINI -- --    Estimated Creatinine Clearance: 38.9 ml/min (by C-G formula based on Cr of 1.45).   Medical History: Past Medical History  Diagnosis Date  . Hypertension   . Diverticula of colon   . Atrial fibrillation     Medications:  Scheduled:    . [COMPLETED] sodium chloride   Intravenous Once  . amLODipine  5 mg Oral Daily  . atenolol  100 mg Oral Daily  . digoxin  0.25 mg Oral Daily  . lisinopril  40 mg Oral Daily  . sodium chloride  3 mL Intravenous Q12H  . Tamsulosin HCl  0.4 mg Oral Daily  . [COMPLETED] vancomycin  1,000 mg Intravenous Once    Assessment: 78 yo male admitted with knee pain. Patient on Coumadin 5mg  daily for atrial fibrillation prior to admission. INR (2.18) is at-goal. Pharmacy consulted to manage vancomycin for possible cellulitis and Coumadin. Patient has already received vancomycin 1gm in ED. Patient has already taken his Coumadin dose for today (12/11).   Goal of Therapy:  INR 2-3 Monitor platelets by anticoagulation protocol: Yes Vancomycin trough 10-15 mcg/mL  Plan:  1. Continue home Coumadin regimen of 5mg  daily. 2. Daily PT / INR. 3. Vancomycin 1gm IV Q24H.  Emeline Gins 02/24/2012,10:34 PM

## 2012-02-24 NOTE — ED Provider Notes (Signed)
History     CSN: [id number]  Arrival date & time 02/24/12  1228   First MD Initiated Contact with Patient 02/24/12 1258      Chief Complaint  Patient presents with  . Knee Pain    (Consider location/radiation/quality/duration/timing/severity/associated sxs/prior treatment) Patient is a 78 y.o. male presenting with knee pain. The history is provided by the patient. No language interpreter was used.  Knee Pain This is a new problem. The current episode started 1 to 4 weeks ago. The problem occurs constantly. The problem has been gradually worsening. Associated symptoms include joint swelling. The symptoms are aggravated by bending and walking. He has tried nothing for the symptoms. The treatment provided moderate relief.  Daughter reports pt injured knee 2 weeks ago.  Pt twisted getting out of a truck.  Pt was walking normally until this week and he began having swelling and pain.  Past Medical History  Diagnosis Date  . Hypertension   . Diverticula of colon   . Atrial fibrillation     Past Surgical History  Procedure Date  . Colon resection     History reviewed. No pertinent family history.  History  Substance Use Topics  . Smoking status: Never Smoker   . Smokeless tobacco: Not on file  . Alcohol Use: No      Review of Systems  Musculoskeletal: Positive for joint swelling.  All other systems reviewed and are negative.    Allergies  Review of patient's allergies indicates no known allergies.  Home Medications  No current outpatient prescriptions on file.  BP 150/62  Pulse 74  Temp 96.8 F (36 C) (Oral)  Resp 18  SpO2 96%  Physical Exam  Nursing note and vitals reviewed. Constitutional: He is oriented to person, place, and time. He appears well-developed and well-nourished.  HENT:  Head: Normocephalic and atraumatic.  Cardiovascular: Normal rate.   Pulmonary/Chest: Effort normal.  Abdominal: Soft.  Musculoskeletal: He exhibits edema and tenderness.        Swollen tender red right knee,  From,  nv ans ns intact  Neurological: He is alert and oriented to person, place, and time. He has normal reflexes.  Skin: Skin is warm.  Psychiatric: He has a normal mood and affect.    ED Course  Procedures (including critical care time)  Labs Reviewed - No data to display No results found.   No diagnosis found.    MDM   Results for orders placed during the hospital encounter of 02/24/12  CBC WITH DIFFERENTIAL      Component Value Range   WBC 8.2  4.0 - 10.5 K/uL   RBC 3.92 (*) 4.22 - 5.81 MIL/uL   Hemoglobin 10.6 (*) 13.0 - 17.0 g/dL   HCT 11.9 (*) 14.7 - 82.9 %   MCV 82.9  78.0 - 100.0 fL   MCH 27.0  26.0 - 34.0 pg   MCHC 32.6  30.0 - 36.0 g/dL   RDW 56.2 (*) 13.0 - 86.5 %   Platelets 150  150 - 400 K/uL   Neutrophils Relative 78 (*) 43 - 77 %   Neutro Abs 6.4  1.7 - 7.7 K/uL   Lymphocytes Relative 13  12 - 46 %   Lymphs Abs 1.0  0.7 - 4.0 K/uL   Monocytes Relative 10  3 - 12 %   Monocytes Absolute 0.8  0.1 - 1.0 K/uL   Eosinophils Relative 0  0 - 5 %   Eosinophils Absolute 0.0  0.0 - 0.7  K/uL   Basophils Relative 0  0 - 1 %   Basophils Absolute 0.0  0.0 - 0.1 K/uL  BASIC METABOLIC PANEL      Component Value Range   Sodium 136  135 - 145 mEq/L   Potassium 5.8 (*) 3.5 - 5.1 mEq/L   Chloride 103  96 - 112 mEq/L   CO2 21  19 - 32 mEq/L   Glucose, Bld 113 (*) 70 - 99 mg/dL   BUN 48 (*) 6 - 23 mg/dL   Creatinine, Ser 1.61 (*) 0.50 - 1.35 mg/dL   Calcium 9.9  8.4 - 09.6 mg/dL   GFR calc non Af Amer 36 (*) >90 mL/min   GFR calc Af Amer 42 (*) >90 mL/min  PROTIME-INR      Component Value Range   Prothrombin Time 23.3 (*) 11.6 - 15.2 seconds   INR 2.18 (*) 0.00 - 1.49   Dg Knee Complete 4 Views Right  02/24/2012  *RADIOLOGY REPORT*  Clinical Data: Pain post injury 4 days ago  RIGHT KNEE - COMPLETE 4+ VIEW  Comparison: None.  Findings: Four views of the right knee submitted.  No acute fracture or subluxation.  The diffuse  osteopenia.  Mild prepatellar soft tissue swelling.  IMPRESSION: No acute fracture or subluxation.  Diffuse osteopenia.  Mild prepatellar soft tissue swelling.   Original Report Authenticated By: Natasha Mead, M.D.            Lonia Skinner [place], [place] 02/24/12 1530

## 2012-02-24 NOTE — ED Provider Notes (Signed)
Medical screening examination/treatment/procedure(s) were performed by non-physician practitioner and as supervising physician I was immediately available for consultation/collaboration.  Carina Chaplin T Neave Lenger, MD 02/24/12 1702 

## 2012-02-24 NOTE — ED Notes (Signed)
MD at bedside. [name] 

## 2012-02-24 NOTE — ED Notes (Signed)
ED Resident Walden in with pt 

## 2012-02-24 NOTE — ED Notes (Signed)
Spoke with admitting MD Toniann Fail regarding pt going to a non tele floor given pt is in afib and is bradycardic.  Pt's bed to be changed to telemetry.

## 2012-02-25 ENCOUNTER — Inpatient Hospital Stay (HOSPITAL_COMMUNITY): Payer: Medicare Other

## 2012-02-25 ENCOUNTER — Encounter (HOSPITAL_COMMUNITY): Payer: Self-pay | Admitting: General Practice

## 2012-02-25 LAB — URINALYSIS, ROUTINE W REFLEX MICROSCOPIC
Glucose, UA: NEGATIVE mg/dL
Leukocytes, UA: NEGATIVE
Nitrite: NEGATIVE
Protein, ur: 100 mg/dL — AB
Urobilinogen, UA: 1 mg/dL (ref 0.0–1.0)

## 2012-02-25 LAB — CBC
HCT: 33.2 % — ABNORMAL LOW (ref 39.0–52.0)
Hemoglobin: 11.3 g/dL — ABNORMAL LOW (ref 13.0–17.0)
RDW: 15.4 % (ref 11.5–15.5)
WBC: 8.9 10*3/uL (ref 4.0–10.5)

## 2012-02-25 LAB — URINE MICROSCOPIC-ADD ON

## 2012-02-25 LAB — DIGOXIN LEVEL: Digoxin Level: 1.7 ng/mL (ref 0.8–2.0)

## 2012-02-25 LAB — BASIC METABOLIC PANEL
BUN: 34 mg/dL — ABNORMAL HIGH (ref 6–23)
Chloride: 104 mEq/L (ref 96–112)
GFR calc Af Amer: 63 mL/min — ABNORMAL LOW (ref >90)
Glucose, Bld: 107 mg/dL — ABNORMAL HIGH (ref 70–99)
Potassium: 4.4 mEq/L (ref 3.5–5.1)

## 2012-02-25 LAB — PROTIME-INR: INR: 2.64 — ABNORMAL HIGH (ref 0.00–1.49)

## 2012-02-25 MED ORDER — COLCHICINE 0.6 MG PO TABS
0.6000 mg | ORAL_TABLET | Freq: Every day | ORAL | Status: DC
  Administered 2012-02-25 – 2012-02-29 (×5): 0.6 mg via ORAL
  Filled 2012-02-25 (×5): qty 1

## 2012-02-25 MED ORDER — HYDROCODONE-ACETAMINOPHEN 5-325 MG PO TABS: 1.0000 | ORAL_TABLET | ORAL | Status: DC | PRN

## 2012-02-25 MED ORDER — PREDNISONE 20 MG PO TABS
30.0000 mg | ORAL_TABLET | Freq: Every day | ORAL | Status: AC
  Administered 2012-02-25 – 2012-02-28 (×4): 30 mg via ORAL
  Filled 2012-02-25 (×4): qty 1

## 2012-02-25 MED ORDER — PREDNISONE 20 MG PO TABS
30.0000 mg | ORAL_TABLET | Freq: Every day | ORAL | Status: DC
  Filled 2012-02-25: qty 1

## 2012-02-25 NOTE — Progress Notes (Signed)
Subjective: Admission H&P review. Patient admitted with knee pain, ankle pain. Patient states he had difficulty getting out of his truck, his foot got caught as he was trying to get out of a truck, transiently his foot was lodge causing him to twist his knee and hurt his ankle. He denies any head trauma. In the ED [facility] [place] [facility] which was negative, ultrasound negative for DVT. 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 Labs do reveal elevated uric acid. There is no elevated white count no fever, at this time I do not feel that infection is cause of his discomfort. He describes trauma to his knee.  Objective: Vital signs in last 24 hours: Temp:  [98.9 F (37.2 C)-99 F (37.2 C)] 98.9 F (37.2 C) (12/12 0526) Pulse Rate:  [59-76] 76  (12/12 0526) Resp:  [18-25] 18  (12/12 0526) BP: (140-174)/(56-71) 174/64 mmHg (12/12 0526) SpO2:  [95 %-100 %] 95 % (12/12 0526) Weight:  [80.4 kg (177 lb 4 oz)-81.6 kg (179 lb 14.3 oz)] 80.4 kg (177 lb 4 oz) (12/12 0526) Weight change:  Last BM Date: 02/23/12  Intake/Output from previous day: 12/11 0701 - 12/12 0700 In: 120 [P.O.:120] Out: 750 [Urine:750] Intake/Output this shift:    General appearance: alert and cooperative Resp: clear to auscultation bilaterally Cardio: irregularly irregular rhythm Extremities: right knee slightly swollen, limited range of motion because of pain. Tender to palpation, right ankle slightly tender, no erythema  Lab Results:  Results for orders placed during the hospital encounter of 02/24/12 (from the past 24 hour(s))  CBC WITH DIFFERENTIAL     Status: Abnormal   Collection Time   02/24/12  1:57 PM      Component Value Range   WBC 8.2  4.0 - 10.5 K/uL   RBC 3.92 (*) 4.22 - 5.81 MIL/uL   Hemoglobin 10.6 (*) 13.0 - 17.0 g/dL   HCT 16.1 (*) 09.6 - 04.5 %   MCV 82.9  78.0 - 100.0 fL   MCH 27.0  26.0 - 34.0 pg   MCHC 32.6  30.0 - 36.0 g/dL   RDW 40.9 (*) 81.1 - 91.4 %   Platelets 150  150 - 400 K/uL    Neutrophils Relative 78 (*) 43 - 77 %   Neutro Abs 6.4  1.7 - 7.7 K/uL   Lymphocytes Relative 13  12 - 46 %   Lymphs Abs 1.0  0.7 - 4.0 K/uL   Monocytes Relative 10  3 - 12 %   Monocytes Absolute 0.8  0.1 - 1.0 K/uL   Eosinophils Relative 0  0 - 5 %   Eosinophils Absolute 0.0  0.0 - 0.7 K/uL   Basophils Relative 0  0 - 1 %   Basophils Absolute 0.0  0.0 - 0.1 K/uL  BASIC METABOLIC PANEL     Status: Abnormal   Collection Time   02/24/12  1:57 PM      Component Value Range   Sodium 136  135 - 145 mEq/L   Potassium 5.8 (*) 3.5 - 5.1 mEq/L   Chloride 103  96 - 112 mEq/L   CO2 21  19 - 32 mEq/L   Glucose, Bld 113 (*) 70 - 99 mg/dL   BUN 48 (*) 6 - 23 mg/dL   Creatinine, Ser 7.82 (*) 0.50 - 1.35 mg/dL   Calcium 9.9  8.4 - 95.6 mg/dL   GFR calc non Af Amer 36 (*) >90 mL/min   GFR calc Af Amer 42 (*) >90  mL/min  PROTIME-INR     Status: Abnormal   Collection Time   02/24/12  1:57 PM      Component Value Range   Prothrombin Time 23.3 (*) 11.6 - 15.2 seconds   INR 2.18 (*) 0.00 - 1.49  BASIC METABOLIC PANEL     Status: Abnormal   Collection Time   02/24/12  9:07 PM      Component Value Range   Sodium 139  135 - 145 mEq/L   Potassium 4.4  3.5 - 5.1 mEq/L   Chloride 105  96 - 112 mEq/L   CO2 22  19 - 32 mEq/L   Glucose, Bld 123 (*) 70 - 99 mg/dL   BUN 44 (*) 6 - 23 mg/dL   Creatinine, Ser 8.11 (*) 0.50 - 1.35 mg/dL   Calcium 9.6  8.4 - 91.4 mg/dL   GFR calc non Af Amer 42 (*) >90 mL/min   GFR calc Af Amer 49 (*) >90 mL/min  CBC     Status: Abnormal   Collection Time   02/25/12  6:15 AM      Component Value Range   WBC 8.9  4.0 - 10.5 K/uL   RBC 4.04 (*) 4.22 - 5.81 MIL/uL   Hemoglobin 11.3 (*) 13.0 - 17.0 g/dL   HCT 78.2 (*) 95.6 - 21.3 %   MCV 82.2  78.0 - 100.0 fL   MCH 28.0  26.0 - 34.0 pg   MCHC 34.0  30.0 - 36.0 g/dL   RDW 08.6  57.8 - 46.9 %   Platelets 169  150 - 400 K/uL  BASIC METABOLIC PANEL     Status: Abnormal   Collection Time   02/25/12  6:15 AM       Component Value Range   Sodium 137  135 - 145 mEq/L   Potassium 4.4  3.5 - 5.1 mEq/L   Chloride 104  96 - 112 mEq/L   CO2 23  19 - 32 mEq/L   Glucose, Bld 107 (*) 70 - 99 mg/dL   BUN 34 (*) 6 - 23 mg/dL   Creatinine, Ser 6.29  0.50 - 1.35 mg/dL   Calcium 9.4  8.4 - 52.8 mg/dL   GFR calc non Af Amer 55 (*) >90 mL/min   GFR calc Af Amer 63 (*) >90 mL/min  URIC ACID     Status: Abnormal   Collection Time   02/25/12  6:15 AM      Component Value Range   Uric Acid, Serum 9.1 (*) 4.0 - 7.8 mg/dL  PROTIME-INR     Status: Abnormal   Collection Time   02/25/12  6:15 AM      Component Value Range   Prothrombin Time 26.9 (*) 11.6 - 15.2 seconds   INR 2.64 (*) 0.00 - 1.49  DIGOXIN LEVEL     Status: Normal   Collection Time   02/25/12  6:15 AM      Component Value Range   Digoxin Level 1.7  0.8 - 2.0 ng/mL      Studies/Results: Dg Chest Port 1 View  02/24/2012  *RADIOLOGY REPORT*  Clinical Data: Chest congestion.  Hypertension.  PORTABLE CHEST - 1 VIEW  Comparison: 11/28/2009.  Findings: Cardiomegaly is increased compared to the prior exam of 2011.  There is no pleural effusion.  Patchy density is present at the cardiac apex which may represent atelectasis, scarring or small focus of pneumonia.  There is no pleural effusion. Monitoring leads are projected over the chest.  Mediastinal contours are within normal limits.  Aortic arch atherosclerosis.  Bilateral AC joint osteoarthritis.  IMPRESSION:  1.  Increasing cardiomegaly without failure. 2.  Small area of patchy density along the cardiac apex may represent pneumonia or atelectasis.   Original Report Authenticated By: Andreas Newport, M.D.    Dg Knee Complete 4 Views Right  02/24/2012  *RADIOLOGY REPORT*  Clinical Data: Pain post injury 4 days ago  RIGHT KNEE - COMPLETE 4+ VIEW  Comparison: None.  Findings: Four views of the right knee submitted.  No acute fracture or subluxation.  The diffuse osteopenia.  Mild prepatellar soft tissue  swelling.  IMPRESSION: No acute fracture or subluxation.  Diffuse osteopenia.  Mild prepatellar soft tissue swelling.   Original Report Authenticated By: Natasha Mead, M.D.     Medications:  Prior to Admission:  Prescriptions prior to admission  Medication Sig Dispense Refill  . amLODipine (NORVASC) 5 MG tablet Take 5 mg by mouth daily.      Marland Kitchen atenolol (TENORMIN) 100 MG tablet Take 100 mg by mouth daily.      . digoxin (LANOXIN) 0.25 MG tablet Take 0.25 mg by mouth daily.      . quinapril (ACCUPRIL) 10 MG tablet Take 40 mg by mouth daily.      . Tamsulosin HCl (FLOMAX) 0.4 MG CAPS Take 0.4 mg by mouth daily.      Marland Kitchen triamcinolone cream (KENALOG) 0.1 % Apply 1 application topically 3 (three) times daily as needed.      . warfarin (COUMADIN) 5 MG tablet Take 5 mg by mouth daily.       Scheduled:   . [COMPLETED] sodium chloride   Intravenous Once  . amLODipine  5 mg Oral Daily  . atenolol  100 mg Oral Daily  . digoxin  0.25 mg Oral Daily  . lisinopril  40 mg Oral Daily  . sodium chloride  3 mL Intravenous Q12H  . Tamsulosin HCl  0.4 mg Oral Daily  . [COMPLETED] vancomycin  1,000 mg Intravenous Once  . warfarin  5 mg Oral q1800  . Warfarin - Pharmacist Dosing Inpatient   Does not apply q1800  . [DISCONTINUED] vancomycin  1,000 mg Intravenous Q24H   Continuous:   . [DISCONTINUED] sodium chloride 75 mL/hr at 02/24/12 2302    Assessment/Plan: Extremity pain, probably secondary to trauma, could be exacerbated as well by his gout, uric acid is elevated. Analgesia will be provided, okay with MRI to rule out ligament damage. We'll also add a taper of prednisone Azotemia, improved after IV fluids. Patient had been taken in states, he's been reminded again to avoid NSAIDs. Hyperkalemia resolved Chronic atrial fibrillation continue Coumadin, current INR therapeutic Hypertension  LOS: 1 day   Sena Clouatre D 02/25/2012, 12:38 PM

## 2012-02-25 NOTE — Progress Notes (Signed)
ANTICOAGULATION Consult Note  Pharmacy Consult for warfarin Indication: atrial fibrillation  No Known Allergies  Patient Measurements: Height: 5\' 11"  (180.3 cm) Weight: 177 lb 4 oz (80.4 kg) IBW/kg (Calculated) : 75.3   Vital Signs: Temp: 98.9 F (37.2 C) (12/12 0526) Temp src: Oral (12/12 0526) BP: 174/64 mmHg (12/12 0526) Pulse Rate: 76  (12/12 0526)  Labs:  Basename 02/25/12 0615 02/24/12 2107 02/24/12 1357  HGB 11.3* -- 10.6*  HCT 33.2* -- 32.5*  PLT 169 -- 150  APTT -- -- --  LABPROT 26.9* -- 23.3*  INR 2.64* -- 2.18*  HEPARINUNFRC -- -- --  CREATININE 1.17 1.45* 1.65*  CKTOTAL -- -- --  CKMB -- -- --  TROPONINI -- -- --    Estimated Creatinine Clearance: 48.3 ml/min (by C-G formula based on Cr of 1.17).   Medical History: Past Medical History  Diagnosis Date  . Hypertension   . Diverticula of colon   . Atrial fibrillation   . Skin cancer of face   . Knee pain, right     "twisted it couple days ago; pain getting worse since" (02/25/2012)  . Arthritis     "right wrist and hand" (02/25/2012)  . Enlarged prostate     Medications:  Scheduled:     . [COMPLETED] sodium chloride   Intravenous Once  . amLODipine  5 mg Oral Daily  . atenolol  100 mg Oral Daily  . digoxin  0.25 mg Oral Daily  . lisinopril  40 mg Oral Daily  . sodium chloride  3 mL Intravenous Q12H  . Tamsulosin HCl  0.4 mg Oral Daily  . [COMPLETED] vancomycin  1,000 mg Intravenous Once  . vancomycin  1,000 mg Intravenous Q24H  . warfarin  5 mg Oral q1800  . Warfarin - Pharmacist Dosing Inpatient   Does not apply q1800    Assessment: 78 yo male admitted with knee pain. Patient on Coumadin 5mg  daily for atrial fibrillation prior to admission.  INR today is 2.64.  May need to adjust dose with addition of antibiotics  Goal of Therapy:  INR 2-3 Monitor platelets by anticoagulation protocol: Yes  Plan:  1. Continue home Coumadin regimen of 5mg  daily. 2. Daily PT / INR.   [name],  [name] 02/25/2012,7:48 AM

## 2012-02-25 NOTE — Progress Notes (Signed)
Pt. C/o of left foot pain now.  Called Dr. Nehemiah Settle to inform.  Wanted to make sure that he got his prednisone today.  Will give & continue to monitor.  Forbes Cellar, RN

## 2012-02-25 NOTE — Care Management Note (Addendum)
    Page 1 of 2   02/29/2012     2:21:28 PM   CARE MANAGEMENT NOTE 02/29/2012  Patient:  [name], [name]   Account Number:  [id number]  Date Initiated:  02/25/2012  Documentation initiated by:  Letha Cape  Subjective/Objective Assessment:   dx right knee pain,? cellulitis  admit- lives alone.     Action/Plan:   pt eval- recs hhpt   Anticipated DC Date:  02/29/2012   Anticipated DC Plan:  HOME W HOME HEALTH SERVICES      DC Planning Services  CM consult      [facility] Choice  HOME HEALTH   Choice offered to / List presented to:  C-1 Patient        HH arranged  HH-1 RN  HH-2 PT      [facility] agency  [facility]   Status of service:  Completed, signed off Medicare Important Message given?   (If response is "NO", the following Medicare IM given date fields will be blank) Date Medicare IM given:   Date Additional Medicare IM given:    Discharge Disposition:  HOME W HOME HEALTH SERVICES  Per UR Regulation:  Reviewed for med. necessity/level of care/duration of stay  If discussed at Long Length of Stay Meetings, dates discussed:    Comments:  02/29/12 10:58 Letha Cape RN, BSN [phone] patient is for dc today per RN, Per physical therapy recs hhpt and patient will also need a HHRN,  patient chose [facility] from agency list.  Referral given to [facility], Debbie notified. Soc will begin 24-48 hrs post discharge.  MD will need to put orders in for [facility] services.  02/25/12 14:14 Letha Cape RN, BSN [phone] patient lives alone, await pt eval.  NCM will continue to follow for dc needs.

## 2012-02-26 NOTE — Evaluation (Signed)
Physical Therapy Evaluation Patient Details Name: [name] MRN: [id number] DOB: 1924/02/26 Today's Date: 02/26/2012 Time: 4098-1191 PT Time Calculation (min): 33 min  PT Assessment / Plan / Recommendation Clinical Impression  Pt adm with rt knee pain after twisting it.  Now pt with bil leg pain thought to be gout.  Expect pt will improve rapidaly as pain improves.    PT Assessment  Patient needs continued PT services    Follow Up Recommendations  Home health PT    Does the patient have the potential to tolerate intense rehabilitation      Barriers to Discharge        Equipment Recommendations  None recommended by PT    Recommendations for Other Services OT consult   Frequency Min 3X/week    Precautions / Restrictions Precautions Precautions: Fall Restrictions Weight Bearing Restrictions: No   Pertinent Vitals/Pain Pain in lt ankle.      Mobility  Bed Mobility Bed Mobility: Supine to Sit;Sitting - Scoot to Edge of Bed Supine to Sit: 4: Min assist;HOB elevated Sitting - Scoot to Edge of Bed: 5: Supervision;With rail Details for Bed Mobility Assistance: assist to move rt leg, incr time Transfers Transfers: Sit to Stand;Stand to Sit Sit to Stand: 1: +2 Total assist;With upper extremity assist;From bed;From toilet Sit to Stand: Patient Percentage: 60% Stand to Sit: 1: +2 Total assist;With upper extremity assist;To chair/3-in-1;To toilet Stand to Sit: Patient Percentage: 60% Transfer via Lift Equipment: Stedy Details for Transfer Assistance: Assist to bring hips up.  Used Stedy to take to the toilet Ambulation/Gait Ambulation/Gait Assistance: 1: +2 Total assist Ambulation/Gait: Patient Percentage: 70% Ambulation Distance (Feet): 5 Feet Assistive device: Rolling walker Ambulation/Gait Assistance Details: verbal cues to stand more erect Gait Pattern: Step-to pattern;Decreased step length - right;Decreased step length - left;Trunk flexed;Antalgic Gait velocity:  slow    Shoulder Instructions     Exercises     PT Diagnosis: Difficulty walking;Acute pain;Generalized weakness  PT Problem List: Decreased activity tolerance;Decreased mobility;Pain PT Treatment Interventions: DME instruction;Gait training;Stair training;Patient/family education;Functional mobility training;Therapeutic activities;Therapeutic exercise   PT Goals Acute Rehab PT Goals PT Goal Formulation: With patient Time For Goal Achievement: 03/04/12 Potential to Achieve Goals: Good Pt will go Supine/Side to Sit: with modified independence PT Goal: Supine/Side to Sit - Progress: Goal set today Pt will go Sit to Supine/Side: with modified independence PT Goal: Sit to Supine/Side - Progress: Goal set today Pt will go Sit to Stand: with modified independence PT Goal: Sit to Stand - Progress: Goal set today Pt will go Stand to Sit: with modified independence Pt will Ambulate: 51 - 150 feet;with modified independence;with least restrictive assistive device PT Goal: Ambulate - Progress: Goal set today Pt will Go Up / Down Stairs: 1-2 stairs;with min assist PT Goal: Up/Down Stairs - Progress: Goal set today  Visit Information  Last PT Received On: 02/26/12 Assistance Needed: +2    Subjective Data  Subjective: Pt states now he has pain in lt leg as well. Patient Stated Goal: Return home   Prior Functioning  Home Living Lives With: Alone Type of Home: House Home Access: Stairs to enter Entrance Stairs-Number of Steps: 1 Home Layout: One level Bathroom Shower/Tub: Engineer, manufacturing systems: Standard Home Adaptive Equipment: Environmental consultant - rolling;Straight cane Prior Function Level of Independence: Independent Able to Take Stairs?: Yes Driving: Yes Vocation: Retired Musician: No difficulties    Cognition  Overall Cognitive Status: Appears within functional limits for tasks assessed/performed Arousal/Alertness: Awake/alert Orientation Level:  Appears  intact for tasks assessed Behavior During Session: [facility] for tasks performed    Extremity/Trunk Assessment Right Lower Extremity Assessment RLE ROM/Strength/Tone: Deficits;Due to pain RLE ROM/Strength/Tone Deficits: functionall 3-/5 Left Lower Extremity Assessment LLE ROM/Strength/Tone: Deficits;Due to pain LLE ROM/Strength/Tone Deficits: functionally 3/5   Balance Static Standing Balance Static Standing - Balance Support: Bilateral upper extremity supported (on walker) Static Standing - Level of Assistance: 4: Min assist  End of Session PT - End of Session Equipment Utilized During Treatment: Gait belt;Other (comment) Antony Salmon) Activity Tolerance: Patient limited by pain Patient left: in chair;with call bell/phone within reach Nurse Communication: Mobility status  GP     [facility] 02/26/2012, 11:59 AM  [facility] PT [phone]

## 2012-02-26 NOTE — Progress Notes (Deleted)
Physical Therapy Evaluation Patient Details Name: [name] MRN: [id number] DOB: 19-Jul-1923 Today's Date: 02/26/2012 Time:  -     PT Assessment / Plan / Recommendation Clinical Impression       PT Assessment       Follow Up Recommendations       Does the patient have the potential to tolerate intense rehabilitation      Barriers to Discharge        Equipment Recommendations       Recommendations for Other Services     Frequency      Precautions / Restrictions     Pertinent Vitals/Pain Lt ankle  02/26/12 1159  PT Visit Information  Last PT Received On 02/26/12  Assistance Needed +2  PT Time Calculation  PT Start Time 1119  PT Stop Time 1152  PT Time Calculation (min) 33 min  Subjective Data  Subjective Pt states now he has pain in lt leg as well.  Patient Stated Goal Return home  Precautions  Precautions Fall  Cognition  Overall Cognitive Status Appears within functional limits for tasks assessed/performed  Arousal/Alertness Awake/alert  Orientation Level Appears intact for tasks assessed  Behavior During Session [facility] for tasks performed  Bed Mobility  Bed Mobility Supine to Sit;Sitting - Scoot to Edge of Bed  Supine to Sit 4: Min assist;HOB elevated  Sitting - Scoot to [place] of Bed 5: Supervision;With rail  Details for Bed Mobility Assistance assist to move rt leg, incr time  Transfers  Sit to Stand: Patient Percentage 60%  Stand to Sit: Patient Percentage 60%  Transfer via Psychologist, forensic  Details for Transfer Assistance Assist to bring hips up.  Used Stedy to take to the toilet  Ambulation/Gait  Ambulation/Gait Assistance 1: +2 Total assist  Ambulation/Gait: Patient Percentage 70%  Assistive device Rolling walker  Gait Pattern Step-to pattern;Decreased step length - right;Decreased step length - left;Trunk flexed;Antalgic  Gait velocity slow  Static Standing Balance  Static Standing - Balance Support Bilateral upper extremity supported (on  walker)  Static Standing - Level of Assistance 4: Min assist  PT - End of Session  Equipment Utilized During Treatment Gait belt;Other (comment) Antony Salmon)  Activity Tolerance Patient limited by pain  Patient left in chair;with call bell/phone within reach  Nurse Communication Mobility status  PT - Assessment/Plan  PT Frequency Min 3X/week  Recommendations for Other Services OT consult  Follow Up Recommendations Home health PT  PT equipment None recommended by PT  Acute Rehab PT Goals  PT Goal Formulation With patient  Time For Goal Achievement 03/04/12  Potential to Achieve Goals Good  Pt will go Supine/Side to Sit with modified independence  PT Goal: Supine/Side to Sit - Progress Goal set today  Pt will go Sit to Supine/Side with modified independence  PT Goal: Sit to Supine/Side - Progress Goal set today  Pt will go Sit to Stand with modified independence  PT Goal: Sit to Stand - Progress Goal set today  Pt will go Stand to Sit with modified independence  Pt will Ambulate 51 - 150 feet;with modified independence;with least restrictive assistive device  PT Goal: Ambulate - Progress Goal set today  Pt will Go Up / Down Stairs 1-2 stairs;with min assist  PT Goal: Up/Down Stairs - Progress Goal set today  PT G-Codes **NOT FOR INPATIENT CLASS**  Functional Assessment Tool Used clinical judgement  Functional Limitation Mobility: Walking and moving around  Mobility: Walking and Moving Around Current Status (W0981) CK  Mobility: Walking and Moving Around Goal Status [phone]) CH  PT General Charges  $$ ACUTE PT VISIT 1 Procedure  PT Evaluation  $Initial PT Evaluation Tier I 1 Procedure  PT Treatments  $Gait Training 23-37 mins         Mobility       Shoulder Instructions     Exercises     PT Diagnosis:    PT Problem List:   PT Treatment Interventions:     PT Goals    Visit Information       Subjective Data      Prior Functioning       Cognition        Extremity/Trunk Assessment     Balance    End of Session    GP     [facility] 02/26/2012, 4:03 PM

## 2012-02-26 NOTE — Progress Notes (Signed)
ANTICOAGULATION Consult Note  Pharmacy Consult for warfarin Indication: atrial fibrillation  No Known Allergies  Patient Measurements: Height: 5\' 11"  (180.3 cm) Weight: 179 lb 3.7 oz (81.3 kg) IBW/kg (Calculated) : 75.3   Vital Signs: Temp: 98.5 F (36.9 C) (12/13 0500) Temp src: Oral (12/13 0500) BP: 131/65 mmHg (12/13 0500) Pulse Rate: 55  (12/13 0500)  Labs:  Basename 02/26/12 0420 02/25/12 0615 02/24/12 2107 02/24/12 1357  HGB -- 11.3* -- 10.6*  HCT -- 33.2* -- 32.5*  PLT -- 169 -- 150  APTT -- -- -- --  LABPROT 25.7* 26.9* -- 23.3*  INR 2.48* 2.64* -- 2.18*  HEPARINUNFRC -- -- -- --  CREATININE -- 1.17 1.45* 1.65*  CKTOTAL -- -- -- --  CKMB -- -- -- --  TROPONINI -- -- -- --    Estimated Creatinine Clearance: 48.3 ml/min (by C-G formula based on Cr of 1.17).   Medical History: Past Medical History  Diagnosis Date  . Hypertension   . Diverticula of colon   . Atrial fibrillation   . Skin cancer of face   . Knee pain, right     "twisted it couple days ago; pain getting worse since" (02/25/2012)  . Arthritis     "right wrist and hand" (02/25/2012)  . Enlarged prostate     Medications:  Scheduled:     . amLODipine  5 mg Oral Daily  . atenolol  100 mg Oral Daily  . colchicine  0.6 mg Oral Daily  . digoxin  0.25 mg Oral Daily  . lisinopril  40 mg Oral Daily  . predniSONE  30 mg Oral Q breakfast  . sodium chloride  3 mL Intravenous Q12H  . Tamsulosin HCl  0.4 mg Oral Daily  . warfarin  5 mg Oral q1800  . Warfarin - Pharmacist Dosing Inpatient   Does not apply q1800    Assessment: 78 yo male admitted with knee pain. Patient on Coumadin 5mg  daily for atrial fibrillation prior to admission.  INR today is 2.48  No bleeding noted  Goal of Therapy:  INR 2-3 Monitor platelets by anticoagulation protocol: Yes  Plan:  1. Continue home Coumadin regimen of 5mg  daily. 2. Daily PT / INR.   Solange Emry Poteet 02/26/2012,7:50 AM

## 2012-02-26 NOTE — Progress Notes (Signed)
02/26/12 1159  PT G-Codes **NOT FOR INPATIENT CLASS**  Functional Assessment Tool Used clinical judgement  Functional Limitation Mobility: Walking and moving around  Mobility: Walking and Moving Around Current Status [phone]) CK  Mobility: Walking and Moving Around Goal Status (W4132) CH  PT General Charges  $$ ACUTE PT VISIT 1 Procedure  PT Evaluation  $Initial PT Evaluation Tier I 1 Procedure  PT Treatments  $Gait Training 23-37 mins   Late entry G code due to change to observation.  [employer] PT [phone]

## 2012-02-26 NOTE — Progress Notes (Signed)
Subjective: Patient began having some pain in his left ankle yesterday, right knee right ankle feels better. He has better mobility. There is no warmth, no erythema, no fever.  Objective: Vital signs in last 24 hours: Temp:  [97.8 F (36.6 C)-98.5 F (36.9 C)] 98.5 F (36.9 C) (12/13 0500) Pulse Rate:  [55-87] 72  (12/13 0956) Resp:  [18-24] 18  (12/13 0500) BP: (131-167)/(61-76) 131/65 mmHg (12/13 0500) SpO2:  [94 %-95 %] 94 % (12/13 0500) Weight:  [81.3 kg (179 lb 3.7 oz)] 81.3 kg (179 lb 3.7 oz) (12/13 0500) Weight change: 0.9 kg (1 lb 15.7 oz) Last BM Date: 02/23/12  Intake/Output from previous day: 12/12 0701 - 12/13 0700 In: 360 [P.O.:360] Out: 720 [Urine:720] Intake/Output this shift: Total I/O In: 240 [P.O.:240] Out: 200 [Urine:200]  Resp: clear to auscultation bilaterally Cardio: irregularly irregular rhythm Extremities: right knee without warmth, without erythema. Increase range of motion. Right ankle without warmth or erythema. Left ankle fairly unremarkable  Lab Results:  Results for orders placed during the hospital encounter of 02/24/12 (from the past 24 hour(s))  URINALYSIS, ROUTINE W REFLEX MICROSCOPIC     Status: Abnormal   Collection Time   02/25/12 10:21 PM      Component Value Range   Color, Urine YELLOW  YELLOW   APPearance CLEAR  CLEAR   Specific Gravity, Urine 1.018  1.005 - 1.030   pH 6.0  5.0 - 8.0   Glucose, UA NEGATIVE  NEGATIVE mg/dL   Hgb urine dipstick MODERATE (*) NEGATIVE   Bilirubin Urine NEGATIVE  NEGATIVE   Ketones, ur NEGATIVE  NEGATIVE mg/dL   Protein, ur 469 (*) NEGATIVE mg/dL   Urobilinogen, UA 1.0  0.0 - 1.0 mg/dL   Nitrite NEGATIVE  NEGATIVE   Leukocytes, UA NEGATIVE  NEGATIVE  URINE MICROSCOPIC-ADD ON     Status: Abnormal   Collection Time   02/25/12 10:21 PM      Component Value Range   Squamous Epithelial / LPF FEW (*) RARE   RBC / HPF 11-20  <3 RBC/hpf   Bacteria, UA RARE  RARE  PROTIME-INR     Status: Abnormal   Collection Time   02/26/12  4:20 AM      Component Value Range   Prothrombin Time 25.7 (*) 11.6 - 15.2 seconds   INR 2.48 (*) 0.00 - 1.49      Studies/Results: Mr Tibia Fibula Right Wo Contrast  02/25/2012  *RADIOLOGY REPORT*  Clinical Data: Right knee and lower leg pain and swelling for 4 days.  On Coumadin.  Reported negative Doppler evaluation for DVT.  MRI OF THE RIGHT LOWER LEG WITHOUT CONTRAST  Technique:  Multiplanar, multisequence MR imaging of the right lower leg was performed. No intravenous contrast was administered.  Comparison: Knee radiographs 02/24/2012.  MRI of the knee is dictated separately.  Findings: This portion of the study was performed using the body coil.  Both lower legs are included on the axial and coronal images.  There is extensive subcutaneous edema which is most pronounced medially and laterally in the distal right lower leg.  There is no focal fluid collection.  No muscular edema is identified.  No vascular abnormalities are identified.  There is no evidence of acute fracture, dislocation or bone destruction.  There is no significant generalized edema within the left lower leg.  Some muscular atrophy is noted in both lower legs, especially within the medial head of the left gastrocnemius and soleus muscles.  IMPRESSION:  1.  Right lower leg subcutaneous edema most consistent with cellulitis.  No focal fluid collection or deep inflammatory change identified. 2.  No evidence of osteomyelitis.  The knee findings are dictated separately.   Original Report Authenticated By: Carey Bullocks, M.D.    Mr Knee Right Wo Contrast  02/25/2012  *RADIOLOGY REPORT*  Clinical Data:  Right knee and lower leg pain and swelling for 4 days.  On Coumadin.  Reported negative Doppler evaluation for DVT.  MRI OF THE RIGHT KNEE WITHOUT CONTRAST  Technique:  Multiplanar, multisequence MR imaging of the right knee was performed.  No intravenous contrast was administered.  Comparison:   Radiographs 02/24/2012.  FINDINGS: There is subcutaneous edema surrounding the knee, especially anterior to the patella and patellar tendon, superficial to the vastus medialis muscle and superficial to the biceps muscle. No focal or drainable fluid collection is seen. MENISCI Medial:  Mild free edge fraying of the posterior horn and body.  No meniscal tear or displaced meniscal fragment. Lateral:  Intact.  LIGAMENTS Cruciates:  No intact anterior cruciate ligament fibers are identified.  There is no edema within the intercondylar notch.  The posterior cruciate ligament is intact with mild posterior buckling. Collaterals:  Intact.  CARTILAGE Patellofemoral:  Well preserved for age. Medial:  Well preserved for age. Lateral:  Well preserved for age.  Joint:  Small knee joint effusion.  No intra-articular loose bodies identified. Popliteal Fossa:  No Baker's cyst.  No popliteal vascular abnormalities are identified. Extensor Mechanism:  Intact. Bones: No extraarticular osseous findings identified.  There is no evidence of osteomyelitis.  IMPRESSION:  1.  Nonspecific subcutaneous edema surrounding the knee consistent with cellulitis in this clinical context.  Correlate clinically. No focal or drainable fluid collection identified. 2.  Small knee joint effusion.  No evidence of osteomyelitis. 3.  ACL deficient knee consistent with old ACL tear.  No acute ligamentous findings.  Lower leg findings are dictated separately.   Original Report Authenticated By: Carey Bullocks, M.D.    Dg Chest Port 1 View  02/24/2012  *RADIOLOGY REPORT*  Clinical Data: Chest congestion.  Hypertension.  PORTABLE CHEST - 1 VIEW  Comparison: 11/28/2009.  Findings: Cardiomegaly is increased compared to the prior exam of 2011.  There is no pleural effusion.  Patchy density is present at the cardiac apex which may represent atelectasis, scarring or small focus of pneumonia.  There is no pleural effusion. Monitoring leads are projected over the  chest.  Mediastinal contours are within normal limits.  Aortic arch atherosclerosis.  Bilateral AC joint osteoarthritis.  IMPRESSION:  1.  Increasing cardiomegaly without failure. 2.  Small area of patchy density along the cardiac apex may represent pneumonia or atelectasis.   Original Report Authenticated By: Andreas Newport, M.D.    Dg Knee Complete 4 Views Right  02/24/2012  *RADIOLOGY REPORT*  Clinical Data: Pain post injury 4 days ago  RIGHT KNEE - COMPLETE 4+ VIEW  Comparison: None.  Findings: Four views of the right knee submitted.  No acute fracture or subluxation.  The diffuse osteopenia.  Mild prepatellar soft tissue swelling.  IMPRESSION: No acute fracture or subluxation.  Diffuse osteopenia.  Mild prepatellar soft tissue swelling.   Original Report Authenticated By: Natasha Mead, M.D.     Medications:  Prior to Admission:  Prescriptions prior to admission  Medication Sig Dispense Refill  . amLODipine (NORVASC) 5 MG tablet Take 5 mg by mouth daily.      Marland Kitchen atenolol (TENORMIN) 100 MG tablet Take 100 mg  by mouth daily.      . digoxin (LANOXIN) 0.25 MG tablet Take 0.25 mg by mouth daily.      . quinapril (ACCUPRIL) 10 MG tablet Take 40 mg by mouth daily.      . Tamsulosin HCl (FLOMAX) 0.4 MG CAPS Take 0.4 mg by mouth daily.      Marland Kitchen triamcinolone cream (KENALOG) 0.1 % Apply 1 application topically 3 (three) times daily as needed.      . warfarin (COUMADIN) 5 MG tablet Take 5 mg by mouth daily.       Scheduled:   . amLODipine  5 mg Oral Daily  . atenolol  100 mg Oral Daily  . colchicine  0.6 mg Oral Daily  . digoxin  0.25 mg Oral Daily  . lisinopril  40 mg Oral Daily  . predniSONE  30 mg Oral Q breakfast  . sodium chloride  3 mL Intravenous Q12H  . Tamsulosin HCl  0.4 mg Oral Daily  . warfarin  5 mg Oral q1800  . Warfarin - Pharmacist Dosing Inpatient   Does not apply q1800   Continuous:   Assessment/Plan: Multiple joint pain after straining his knee and right ankle while trying to  get out of his car.MRI shows old ligament damage. MRI does suggest soft tissue inflammation suspicious for cellulitis however there is no warmth, no erythema. Patient does have a history of gout, since starting colchicine prednisone mobility is dramatically improved. Continue colchicine, prednisone.physical therapy, analgesia and  Hypertension stable Chronic A. Fib on Coumadin, current I-9 therapeutic her  LOS: 2 days   Luv Mish D 02/26/2012, 12:28 PM

## 2012-02-27 LAB — PROTIME-INR: Prothrombin Time: 26.7 seconds — ABNORMAL HIGH (ref 11.6–15.2)

## 2012-02-27 MED ORDER — ATENOLOL 25 MG PO TABS
25.0000 mg | ORAL_TABLET | Freq: Every day | ORAL | Status: DC
  Administered 2012-02-28 – 2012-02-29 (×2): 25 mg via ORAL
  Filled 2012-02-27 (×2): qty 1

## 2012-02-27 NOTE — Progress Notes (Signed)
Physical Therapy Treatment Patient Details Name: [name] MRN: [id number] DOB: [birth year] Today's Date: 02/27/2012 Time: 1200-1220 PT Time Calculation (min): 20 min  PT Assessment / Plan / Recommendation Comments on Treatment Session  patient doing much better today with less pain and more mobility.  Discussed with patient and family to use RW at discharge and recommend HHPT to continue to progress strength and mobiliy to advance to cane or no device.      Follow Up Recommendations  Home health PT     Does the patient have the potential to tolerate intense rehabilitation     Barriers to Discharge        Equipment Recommendations  None recommended by PT (has RW)    Recommendations for Other Services    Frequency Min 3X/week   Plan Discharge plan remains appropriate;Frequency remains appropriate    Precautions / Restrictions Precautions Precautions: Fall Restrictions Weight Bearing Restrictions: No   Pertinent Vitals/Pain No significant pain    Mobility  Transfers Transfers: Sit to Stand;Stand to Sit Sit to Stand: 5: Supervision;From chair/3-in-1;With upper extremity assist Stand to Sit: 5: Supervision;To chair/3-in-1;With upper extremity assist Ambulation/Gait Ambulation/Gait Assistance: 5: Supervision Ambulation Distance (Feet): 100 Feet Assistive device: Rolling walker Ambulation/Gait Assistance Details: attempted ambulation without RW - patient unable, too unsteady Gait Pattern: Step-through pattern;Decreased stride length;Shuffle    Exercises     PT Diagnosis:    PT Problem List:   PT Treatment Interventions:     PT Goals Acute Rehab PT Goals PT Goal: Sit to Stand - Progress: Progressing toward goal PT Goal: Stand to Sit - Progress: Progressing toward goal PT Goal: Ambulate - Progress: Progressing toward goal  Visit Information  Last PT Received On: 02/27/12 Assistance Needed: +1    Subjective Data  Subjective: "I am doing much better"    Cognition  Overall Cognitive Status: Appears within functional limits for tasks assessed/performed Arousal/Alertness: Awake/alert Orientation Level: Appears intact for tasks assessed Behavior During Session: [facility] for tasks performed    Balance  Static Standing Balance Static Standing - Balance Support: Bilateral upper extremity supported (on walker) Static Standing - Level of Assistance: 4: Min assist  End of Session PT - End of Session Equipment Utilized During Treatment: Gait belt Activity Tolerance: Patient tolerated treatment well Patient left: in chair;with family/visitor present   GP     Olivia Canter, [place] [phone] 02/27/2012, 12:34 PM

## 2012-02-27 NOTE — Progress Notes (Signed)
ANTICOAGULATION Consult Note  Pharmacy Consult for warfarin Indication: atrial fibrillation  No Known Allergies  Patient Measurements: Height: 5\' 11"  (180.3 cm) Weight: 176 lb 9.4 oz (80.1 kg) IBW/kg (Calculated) : 75.3   Vital Signs: Temp: 97.5 F (36.4 C) (12/14 1340) Temp src: Oral (12/14 0700) BP: 136/65 mmHg (12/14 1340) Pulse Rate: 55  (12/14 1340)  Labs:  Basename 02/27/12 0515 02/26/12 0420 02/25/12 0615 02/24/12 2107  HGB -- -- 11.3* --  HCT -- -- 33.2* --  PLT -- -- 169 --  APTT -- -- -- --  LABPROT 26.7* 25.7* 26.9* --  INR 2.62* 2.48* 2.64* --  HEPARINUNFRC -- -- -- --  CREATININE -- -- 1.17 1.45*  CKTOTAL -- -- -- --  CKMB -- -- -- --  TROPONINI -- -- -- --    Estimated Creatinine Clearance: 48.3 ml/min (by C-G formula based on Cr of 1.17).   Medical History: Past Medical History  Diagnosis Date  . Hypertension   . Diverticula of colon   . Atrial fibrillation   . Skin cancer of face   . Knee pain, right     "twisted it couple days ago; pain getting worse since" (02/25/2012)  . Arthritis     "right wrist and hand" (02/25/2012)  . Enlarged prostate     Medications:  Scheduled:     . amLODipine  5 mg Oral Daily  . atenolol  100 mg Oral Daily  . colchicine  0.6 mg Oral Daily  . digoxin  0.25 mg Oral Daily  . lisinopril  40 mg Oral Daily  . predniSONE  30 mg Oral Q breakfast  . sodium chloride  3 mL Intravenous Q12H  . Tamsulosin HCl  0.4 mg Oral Daily  . warfarin  5 mg Oral q1800  . Warfarin - Pharmacist Dosing Inpatient   Does not apply q1800    Assessment: 78 yo male admitted with knee pain. Patient on Coumadin 5mg  daily for atrial fibrillation prior to admission.  INR today is 2.48  No bleeding noted  Goal of Therapy:  INR 2-3 Monitor platelets by anticoagulation protocol: Yes  Plan:  1. Continue home Coumadin regimen of 5mg  daily. 2. Daily PT / INR.   Drue Stager 02/27/2012,3:04 PM

## 2012-02-27 NOTE — Progress Notes (Signed)
Subjective: The patient states he is still having pain with movement in both knees left worse than right and has not ambulated  Objective: Vital signs in last 24 hours: Temp:  [97.5 F (36.4 C)-98 F (36.7 C)] 98 F (36.7 C) (12/14 0700) Pulse Rate:  [54-72] 54  (12/14 0700) Resp:  [18-20] 18  (12/14 0700) BP: (125-162)/(60-81) 125/65 mmHg (12/14 0700) SpO2:  [97 %-99 %] 98 % (12/14 0700) Weight:  [80.1 kg (176 lb 9.4 oz)] 80.1 kg (176 lb 9.4 oz) (12/14 0700) Weight change: -1.2 kg (-2 lb 10.3 oz) Last BM Date: 02/26/12  Intake/Output from previous day: 12/13 0701 - 12/14 0700 In: 723 [P.O.:720; I.V.:3] Out: 400 [Urine:400] Intake/Output this shift: Total I/O In: -  Out: 400 [Urine:400]  Resp: clear to auscultation bilaterally Cardio: regular rate and rhythm, S1, S2 normal, no murmur, click, rub or gallop  Lab Results:  Basename 02/25/12 0615 02/24/12 1357  WBC 8.9 8.2  HGB 11.3* 10.6*  HCT 33.2* 32.5*  PLT 169 150   BMET  Basename 02/25/12 0615 02/24/12 2107  NA 137 139  K 4.4 4.4  CL 104 105  CO2 23 22  GLUCOSE 107* 123*  BUN 34* 44*  CREATININE 1.17 1.45*  CALCIUM 9.4 9.6    Studies/Results: Mr Tibia Fibula Right Wo Contrast  02/25/2012  *RADIOLOGY REPORT*  Clinical Data: Right knee and lower leg pain and swelling for 4 days.  On Coumadin.  Reported negative Doppler evaluation for DVT.  MRI OF THE RIGHT LOWER LEG WITHOUT CONTRAST  Technique:  Multiplanar, multisequence MR imaging of the right lower leg was performed. No intravenous contrast was administered.  Comparison: Knee radiographs 02/24/2012.  MRI of the knee is dictated separately.  Findings: This portion of the study was performed using the body coil.  Both lower legs are included on the axial and coronal images.  There is extensive subcutaneous edema which is most pronounced medially and laterally in the distal right lower leg.  There is no focal fluid collection.  No muscular edema is identified.  No  vascular abnormalities are identified.  There is no evidence of acute fracture, dislocation or bone destruction.  There is no significant generalized edema within the left lower leg.  Some muscular atrophy is noted in both lower legs, especially within the medial head of the left gastrocnemius and soleus muscles.  IMPRESSION:  1.  Right lower leg subcutaneous edema most consistent with cellulitis.  No focal fluid collection or deep inflammatory change identified. 2.  No evidence of osteomyelitis.  The knee findings are dictated separately.   Original Report Authenticated By: Carey Bullocks, M.D.    Mr Knee Right Wo Contrast  02/25/2012  *RADIOLOGY REPORT*  Clinical Data:  Right knee and lower leg pain and swelling for 4 days.  On Coumadin.  Reported negative Doppler evaluation for DVT.  MRI OF THE RIGHT KNEE WITHOUT CONTRAST  Technique:  Multiplanar, multisequence MR imaging of the right knee was performed.  No intravenous contrast was administered.  Comparison:  Radiographs 02/24/2012.  FINDINGS: There is subcutaneous edema surrounding the knee, especially anterior to the patella and patellar tendon, superficial to the vastus medialis muscle and superficial to the biceps muscle. No focal or drainable fluid collection is seen. MENISCI Medial:  Mild free edge fraying of the posterior horn and body.  No meniscal tear or displaced meniscal fragment. Lateral:  Intact.  LIGAMENTS Cruciates:  No intact anterior cruciate ligament fibers are identified.  There is no  edema within the intercondylar notch.  The posterior cruciate ligament is intact with mild posterior buckling. Collaterals:  Intact.  CARTILAGE Patellofemoral:  Well preserved for age. Medial:  Well preserved for age. Lateral:  Well preserved for age.  Joint:  Small knee joint effusion.  No intra-articular loose bodies identified. Popliteal Fossa:  No Baker's cyst.  No popliteal vascular abnormalities are identified. Extensor Mechanism:  Intact. Bones: No  extraarticular osseous findings identified.  There is no evidence of osteomyelitis.  IMPRESSION:  1.  Nonspecific subcutaneous edema surrounding the knee consistent with cellulitis in this clinical context.  Correlate clinically. No focal or drainable fluid collection identified. 2.  Small knee joint effusion.  No evidence of osteomyelitis. 3.  ACL deficient knee consistent with old ACL tear.  No acute ligamentous findings.  Lower leg findings are dictated separately.   Original Report Authenticated By: Carey Bullocks, M.D.     Medications:  Prior to Admission:  Prescriptions prior to admission  Medication Sig Dispense Refill  . amLODipine (NORVASC) 5 MG tablet Take 5 mg by mouth daily.      Marland Kitchen atenolol (TENORMIN) 100 MG tablet Take 100 mg by mouth daily.      . digoxin (LANOXIN) 0.25 MG tablet Take 0.25 mg by mouth daily.      . quinapril (ACCUPRIL) 10 MG tablet Take 40 mg by mouth daily.      . Tamsulosin HCl (FLOMAX) 0.4 MG CAPS Take 0.4 mg by mouth daily.      Marland Kitchen triamcinolone cream (KENALOG) 0.1 % Apply 1 application topically 3 (three) times daily as needed.      . warfarin (COUMADIN) 5 MG tablet Take 5 mg by mouth daily.       Scheduled:   . amLODipine  5 mg Oral Daily  . atenolol  100 mg Oral Daily  . colchicine  0.6 mg Oral Daily  . digoxin  0.25 mg Oral Daily  . lisinopril  40 mg Oral Daily  . predniSONE  30 mg Oral Q breakfast  . sodium chloride  3 mL Intravenous Q12H  . Tamsulosin HCl  0.4 mg Oral Daily  . warfarin  5 mg Oral q1800  . Warfarin - Pharmacist Dosing Inpatient   Does not apply q1800    Assessment/Plan: 1.knee pain improving both post traumatic and gout. He needs to ambulate as he states his balance is poor at baseline 2. afib rate controlled inr therapeutic  LOS: 3 days   Ginette Otto 02/27/2012, 8:43 AM

## 2012-02-27 NOTE — Progress Notes (Signed)
02/27/12 1700 call placed to Dr. Pete Glatter about [name] heartrate in 30's stated to write order to decrease his Tenormin to 25 mg in am.

## 2012-02-28 MED ORDER — WARFARIN SODIUM 2.5 MG PO TABS
2.5000 mg | ORAL_TABLET | Freq: Once | ORAL | Status: AC
  Administered 2012-02-28: 2.5 mg via ORAL
  Filled 2012-02-28: qty 1

## 2012-02-28 NOTE — Progress Notes (Signed)
Subjective: The patient had a good night.  No dyspnea or lightheadedness. He states pain in legs better and is ambulating better  Objective: Vital signs in last 24 hours: Temp:  [97.4 F (36.3 C)-97.5 F (36.4 C)] 97.4 F (36.3 C) (12/15 0548) Pulse Rate:  [48-57] 48  (12/15 0548) Resp:  [16-18] 16  (12/15 0548) BP: (136-159)/(58-69) 145/58 mmHg (12/15 0548) SpO2:  [96 %-99 %] 96 % (12/15 0548) Weight:  [79.198 kg (174 lb 9.6 oz)] 79.198 kg (174 lb 9.6 oz) (12/15 0548) Weight change: -0.902 kg (-1 lb 15.8 oz) Last BM Date: 02/27/12  Intake/Output from previous day: 12/14 0701 - 12/15 0700 In: 1070 [P.O.:1070] Out: 1500 [Urine:1500] Intake/Output this shift:    Resp: clear to auscultation bilaterally Cardio: irregularly irregular rhythm  Lab Results: No results found for this basename: WBC:2,HGB:2,HCT:2,PLT:2 in the last 72 hours BMET No results found for this basename: NA:2,K:2,CL:2,CO2:2,GLUCOSE:2,BUN:2,CREATININE:2,CALCIUM:2 in the last 72 hours  Studies/Results: No results found.  Medications:  Prior to Admission:  Prescriptions prior to admission  Medication Sig Dispense Refill  . amLODipine (NORVASC) 5 MG tablet Take 5 mg by mouth daily.      Marland Kitchen atenolol (TENORMIN) 100 MG tablet Take 100 mg by mouth daily.      . digoxin (LANOXIN) 0.25 MG tablet Take 0.25 mg by mouth daily.      . quinapril (ACCUPRIL) 10 MG tablet Take 40 mg by mouth daily.      . Tamsulosin HCl (FLOMAX) 0.4 MG CAPS Take 0.4 mg by mouth daily.      Marland Kitchen triamcinolone cream (KENALOG) 0.1 % Apply 1 application topically 3 (three) times daily as needed.      . warfarin (COUMADIN) 5 MG tablet Take 5 mg by mouth daily.       Scheduled:   . amLODipine  5 mg Oral Daily  . atenolol  25 mg Oral Daily  . colchicine  0.6 mg Oral Daily  . digoxin  0.25 mg Oral Daily  . lisinopril  40 mg Oral Daily  . sodium chloride  3 mL Intravenous Q12H  . Tamsulosin HCl  0.4 mg Oral Daily  . warfarin  5 mg Oral q1800   . Warfarin - Pharmacist Dosing Inpatient   Does not apply q1800    Assessment/Plan: 1. Bradycardia. The patient remains in afib however last pm and this am rate has periodically dropped into the 30s. He has been asymptomatic.  I have reduced his atenolol today. Will continue to follow rate 2. Leg pain from twisting injury and possibly gout is better  LOS: 4 days   Jenie Parish THOMAS 02/28/2012, 9:22 AM

## 2012-02-28 NOTE — Progress Notes (Signed)
ANTICOAGULATION Consult Note - Follow-Up  Pharmacy Consult for warfarin Indication: atrial fibrillation  No Known Allergies  Patient Measurements: Height: 5\' 11"  (180.3 cm) Weight: 174 lb 9.6 oz (79.198 kg) IBW/kg (Calculated) : 75.3   Vital Signs: Temp: 97.4 F (36.3 C) (12/15 1356) Temp src: Axillary (12/15 1356) BP: 122/58 mmHg (12/15 1356) Pulse Rate: 48  (12/15 1356)  Labs:  Basename 02/28/12 0625 02/27/12 0515 02/26/12 0420  HGB -- -- --  HCT -- -- --  PLT -- -- --  APTT -- -- --  LABPROT 28.5* 26.7* 25.7*  INR 2.86* 2.62* 2.48*  HEPARINUNFRC -- -- --  CREATININE -- -- --  CKTOTAL -- -- --  CKMB -- -- --  TROPONINI -- -- --    Estimated Creatinine Clearance: 48.3 ml/min (by C-G formula based on Cr of 1.17).  Assessment: Patient on Coumadin 5mg  daily for atrial fibrillation.  INR remains therapeutic - trending up.  No bleeding noted  Goal of Therapy:  INR 2-3 Monitor platelets by anticoagulation protocol: Yes  Plan:  1. D/c 5mg  daily dose. Will give coumadin 2.5mg  today. 2. Daily PT / INR.  Christoper Fabian, PharmD, BCPS Clinical pharmacist, pager [phone] 02/28/2012,3:08 PM

## 2012-02-29 DIAGNOSIS — M25561 Pain in right knee: Secondary | ICD-10-CM

## 2012-02-29 LAB — PROTIME-INR
INR: 2.94 — ABNORMAL HIGH (ref 0.00–1.49)
Prothrombin Time: 29.1 seconds — ABNORMAL HIGH (ref 11.6–15.2)

## 2012-02-29 MED ORDER — COLCHICINE 0.6 MG PO TABS: 0.6000 mg | ORAL_TABLET | Freq: Every day | ORAL | Status: DC

## 2012-02-29 MED ORDER — ATENOLOL 25 MG PO TABS: 25.0000 mg | ORAL_TABLET | Freq: Every day | ORAL | Status: DC

## 2012-02-29 MED ORDER — WARFARIN SODIUM 2.5 MG PO TABS
2.5000 mg | ORAL_TABLET | Freq: Once | ORAL | Status: DC
  Filled 2012-02-29: qty 1

## 2012-02-29 NOTE — Progress Notes (Signed)
Physical Therapy Treatment Patient Details Name: [name] MRN: [id number] DOB: 12-30-1923 Today's Date: 02/29/2012 Time: 2956-2130 PT Time Calculation (min): 8 min  PT Assessment / Plan / Recommendation Comments on Treatment Session  Pt with much improved mobility.  Ready for DC home from PT standpoint.    Follow Up Recommendations  Home health PT     Does the patient have the potential to tolerate intense rehabilitation     Barriers to Discharge        Equipment Recommendations  None recommended by PT    Recommendations for Other Services    Frequency Min 3X/week   Plan Discharge plan remains appropriate;Frequency remains appropriate    Precautions / Restrictions Precautions Precautions: Fall   Pertinent Vitals/Pain N/A    Mobility  Transfers Sit to Stand: 6: Modified independent (Device/Increase time);With upper extremity assist;With armrests;From chair/3-in-1 Stand to Sit: 6: Modified independent (Device/Increase time);With armrests;With upper extremity assist;To chair/3-in-1 Ambulation/Gait Ambulation/Gait Assistance: 5: Supervision Ambulation Distance (Feet): 225 Feet Assistive device: Rolling walker Ambulation/Gait Assistance Details: Verbal cues to stay closer to walker and lengthen steps Gait Pattern: Step-through pattern;Decreased step length - right;Decreased step length - left    Exercises     PT Diagnosis:    PT Problem List:   PT Treatment Interventions:     PT Goals Acute Rehab PT Goals PT Goal: Sit to Stand - Progress: Met PT Goal: Stand to Sit - Progress: Met PT Goal: Ambulate - Progress: Progressing toward goal  Visit Information  Last PT Received On: 02/29/12 Assistance Needed: +1    Subjective Data  Subjective: Pt states he is much better.   Cognition  Overall Cognitive Status: Appears within functional limits for tasks assessed/performed Arousal/Alertness: Awake/alert Orientation Level: Appears intact for tasks  assessed Behavior During Session: [facility] for tasks performed    Balance  Static Standing Balance Static Standing - Balance Support: Bilateral upper extremity supported (on walker) Static Standing - Level of Assistance: 6: Modified independent (Device/Increase time)  End of Session PT - End of Session Activity Tolerance: Patient tolerated treatment well Patient left: in chair;with call bell/phone within reach;with family/visitor present Nurse Communication: Mobility status   GP     [name] 02/29/2012, 1:25 PM  [facility] PT [phone]

## 2012-02-29 NOTE — Progress Notes (Signed)
Pt discharged to home per MD order.  Pt received and reviewed all discharge instructions and medication information including follow-up appointments.  RN spoke with Dr. Nehemiah Settle at discharge who states pt prescriptions have been sent to the pt's pharmacy.  Pt alert and oriented at discharge with no complaints of pain.  Case manager confirmed pt to receive home health RN and PT.  Pt escorted to private vehicle via wheelchair by guest services.   Efraim Kaufmann

## 2012-02-29 NOTE — Discharge Summary (Signed)
Physician Discharge Summary  Patient ID: [name] MRN: [id number] DOB/AGE: 76-Sep-1925 76 y.o.  Admit date: 02/24/2012 Discharge date: 02/29/2012  Admission Diagnoses:  Discharge Diagnoses:  Principal Problem:  *Right knee pain Active Problems:  Atrial fibrillation  HTN (hypertension)  ARF (acute renal failure)  Anemia  Knee pain, right   Discharged Condition: stable  Hospital Course:  Patient presented to the hospital with complaint of right knee pain and leg swelling. In emergency room an attempt at arthrocentesis was made however no fluid was yielded. Because of knee pain swelling, there was concern for cellulitis. Patient was admitted to the hospital, MRI was ordered. At the time of my evaluation there was no signs of cellulitis, antibiotics were stopped. Patient's uric acid was elevated. Patient's history is more consistent with trauma and possible flaring of gout as he had multiple joint arthritides. Colchicine,and  prednisone was added. Patient had significant improvement in his  range of motion and ability to ambulate. MRI suggested nonspecific soft tissue inflammation however clinically there is no cellulitis it also showed old ligamentous damage to the knee. Patient was seen and evaluated by physical therapy. Ambulating with a rolling walker, felt the patient could continue with her rolling  walker at home and home health physical therapy. Patient's hospital course was complicated by some transient bradycardia, beta blocker was reduced. Patient remained asymptomatic throughout this hospitalization. He's felt to be stable for discharge to home.  Consults:    Significant Diagnostic Studies:Mr Tibia Fibula Right Wo Contrast  02/25/2012  *RADIOLOGY REPORT*  Clinical Data: Right knee and lower leg pain and swelling for 4 days.  On Coumadin.  Reported negative Doppler evaluation for DVT.  MRI OF THE RIGHT LOWER LEG WITHOUT CONTRAST  Technique:  Multiplanar, multisequence MR  imaging of the right lower leg was performed. No intravenous contrast was administered.  Comparison: Knee radiographs 02/24/2012.  MRI of the knee is dictated separately.  Findings: This portion of the study was performed using the body coil.  Both lower legs are included on the axial and coronal images.  There is extensive subcutaneous edema which is most pronounced medially and laterally in the distal right lower leg.  There is no focal fluid collection.  No muscular edema is identified.  No vascular abnormalities are identified.  There is no evidence of acute fracture, dislocation or bone destruction.  There is no significant generalized edema within the left lower leg.  Some muscular atrophy is noted in both lower legs, especially within the medial head of the left gastrocnemius and soleus muscles.  IMPRESSION:  1.  Right lower leg subcutaneous edema most consistent with cellulitis.  No focal fluid collection or deep inflammatory change identified. 2.  No evidence of osteomyelitis.  The knee findings are dictated separately.   Original Report Authenticated By: Carey Bullocks, M.D.    Mr Knee Right Wo Contrast  02/25/2012  *RADIOLOGY REPORT*  Clinical Data:  Right knee and lower leg pain and swelling for 4 days.  On Coumadin.  Reported negative Doppler evaluation for DVT.  MRI OF THE RIGHT KNEE WITHOUT CONTRAST  Technique:  Multiplanar, multisequence MR imaging of the right knee was performed.  No intravenous contrast was administered.  Comparison:  Radiographs 02/24/2012.  FINDINGS: There is subcutaneous edema surrounding the knee, especially anterior to the patella and patellar tendon, superficial to the vastus medialis muscle and superficial to the biceps muscle. No focal or drainable fluid collection is seen. MENISCI Medial:  Mild free edge fraying of  the posterior horn and body.  No meniscal tear or displaced meniscal fragment. Lateral:  Intact.  LIGAMENTS Cruciates:  No intact anterior cruciate ligament  fibers are identified.  There is no edema within the intercondylar notch.  The posterior cruciate ligament is intact with mild posterior buckling. Collaterals:  Intact.  CARTILAGE Patellofemoral:  Well preserved for age. Medial:  Well preserved for age. Lateral:  Well preserved for age.  Joint:  Small knee joint effusion.  No intra-articular loose bodies identified. Popliteal Fossa:  No Baker's cyst.  No popliteal vascular abnormalities are identified. Extensor Mechanism:  Intact. Bones: No extraarticular osseous findings identified.  There is no evidence of osteomyelitis.  IMPRESSION:  1.  Nonspecific subcutaneous edema surrounding the knee consistent with cellulitis in this clinical context.  Correlate clinically. No focal or drainable fluid collection identified. 2.  Small knee joint effusion.  No evidence of osteomyelitis. 3.  ACL deficient knee consistent with old ACL tear.  No acute ligamentous findings.  Lower leg findings are dictated separately.   Original Report Authenticated By: Carey Bullocks, M.D.    Dg Chest Port 1 View  02/24/2012  *RADIOLOGY REPORT*  Clinical Data: Chest congestion.  Hypertension.  PORTABLE CHEST - 1 VIEW  Comparison: 11/28/2009.  Findings: Cardiomegaly is increased compared to the prior exam of 2011.  There is no pleural effusion.  Patchy density is present at the cardiac apex which may represent atelectasis, scarring or small focus of pneumonia.  There is no pleural effusion. Monitoring leads are projected over the chest.  Mediastinal contours are within normal limits.  Aortic arch atherosclerosis.  Bilateral AC joint osteoarthritis.  IMPRESSION:  1.  Increasing cardiomegaly without failure. 2.  Small area of patchy density along the cardiac apex may represent pneumonia or atelectasis.   Original Report Authenticated By: Andreas Newport, M.D.    Dg Knee Complete 4 Views Right  02/24/2012  *RADIOLOGY REPORT*  Clinical Data: Pain post injury 4 days ago  RIGHT KNEE - COMPLETE  4+ VIEW  Comparison: None.  Findings: Four views of the right knee submitted.  No acute fracture or subluxation.  The diffuse osteopenia.  Mild prepatellar soft tissue swelling.  IMPRESSION: No acute fracture or subluxation.  Diffuse osteopenia.  Mild prepatellar soft tissue swelling.   Original Report Authenticated By: Natasha Mead, M.D.       Discharge Exam: Blood pressure 133/59, pulse 67, temperature 97.9 F (36.6 C), temperature source Oral, resp. rate 20, height 5\' 11"  (1.803 m), weight 80 kg (176 lb 5.9 oz), SpO2 97.00%. General appearance: alert and cooperative Cardio: irregularly irregular rhythm Extremities: no clubbing cyanosis or edema  Disposition:      Medication List     As of 02/29/2012  1:00 PM    TAKE these medications         amLODipine 5 MG tablet   Commonly known as: NORVASC   Take 5 mg by mouth daily.      atenolol 25 MG tablet   Commonly known as: TENORMIN   Take 1 tablet (25 mg total) by mouth daily.      colchicine 0.6 MG tablet   Take 1 tablet (0.6 mg total) by mouth daily.      digoxin 0.25 MG tablet   Commonly known as: LANOXIN   Take 0.25 mg by mouth daily.      quinapril 10 MG tablet   Commonly known as: ACCUPRIL   Take 40 mg by mouth daily.      Tamsulosin  HCl 0.4 MG Caps   Commonly known as: FLOMAX   Take 0.4 mg by mouth daily.      triamcinolone cream 0.1 %   Commonly known as: KENALOG   Apply 1 application topically 3 (three) times daily as needed.      warfarin 5 MG tablet   Commonly known as: COUMADIN   Take 5 mg by mouth daily.         SignedRenford Dills D 02/29/2012, 1:00 PM

## 2012-02-29 NOTE — Progress Notes (Signed)
ANTICOAGULATION Consult Note - Follow-Up  Pharmacy Consult for warfarin Indication: atrial fibrillation  No Known Allergies  Patient Measurements: Height: 5\' 11"  (180.3 cm) Weight: 176 lb 5.9 oz (80 kg) IBW/kg (Calculated) : 75.3   Vital Signs: Temp: 97.9 F (36.6 C) (12/16 0431) Temp src: Oral (12/16 0431) BP: 133/59 mmHg (12/16 1003) Pulse Rate: 67  (12/16 1003)  Labs:  Basename 02/29/12 0512 02/28/12 0625 02/27/12 0515  HGB -- -- --  HCT -- -- --  PLT -- -- --  APTT -- -- --  LABPROT 29.1* 28.5* 26.7*  INR 2.94* 2.86* 2.62*  HEPARINUNFRC -- -- --  CREATININE -- -- --  CKTOTAL -- -- --  CKMB -- -- --  TROPONINI -- -- --    Estimated Creatinine Clearance: 48.3 ml/min (by C-G formula based on Cr of 1.17).  Assessment: Patient on Coumadin 5mg  daily for atrial fibrillation.  INR remains therapeutic - trending up.  No bleeding noted  Goal of Therapy:  INR 2-3 Monitor platelets by anticoagulation protocol: Yes  Plan:  1. D/c 5mg  daily dose. Will repeat coumadin 2.5mg  today. 2. Daily PT / INR.  Thank you. Okey Regal, PharmD [phone]  02/29/2012,10:32 AM

## 2012-04-01 ENCOUNTER — Other Ambulatory Visit: Payer: Self-pay | Admitting: *Deleted

## 2012-04-01 DIAGNOSIS — I6529 Occlusion and stenosis of unspecified carotid artery: Secondary | ICD-10-CM

## 2012-04-12 ENCOUNTER — Encounter: Payer: Self-pay | Admitting: Neurosurgery

## 2012-04-13 ENCOUNTER — Other Ambulatory Visit: Payer: Medicare Other

## 2012-04-13 ENCOUNTER — Ambulatory Visit: Payer: Medicare Other | Admitting: Neurosurgery

## 2012-05-05 ENCOUNTER — Other Ambulatory Visit: Payer: Medicare Other

## 2012-05-05 ENCOUNTER — Ambulatory Visit: Payer: Medicare Other | Admitting: Neurosurgery

## 2012-05-10 ENCOUNTER — Encounter: Payer: Self-pay | Admitting: Neurosurgery

## 2012-05-11 ENCOUNTER — Ambulatory Visit (INDEPENDENT_AMBULATORY_CARE_PROVIDER_SITE_OTHER): Payer: Medicare Other | Admitting: Neurosurgery

## 2012-05-11 ENCOUNTER — Encounter: Payer: Self-pay | Admitting: Neurosurgery

## 2012-05-11 ENCOUNTER — Other Ambulatory Visit (INDEPENDENT_AMBULATORY_CARE_PROVIDER_SITE_OTHER): Payer: Medicare Other | Admitting: *Deleted

## 2012-05-11 DIAGNOSIS — I6529 Occlusion and stenosis of unspecified carotid artery: Secondary | ICD-10-CM

## 2012-05-11 NOTE — Progress Notes (Signed)
[facility] Carotid Office Note  CC: Carotid surveillance Referring Physician: Early  History of Present Illness: 79 year old male patient of Dr. Arbie Cookey status post right CEA in 2011. The patient denies any signs or symptoms of CVA, TIA, amaurosis fugax. The patient is very hard of hearing and did not bring his hearing aids with him today.  Past Medical History  Diagnosis Date  . Hypertension   . Diverticula of colon   . Atrial fibrillation   . Skin cancer of face   . Knee pain, right     "twisted it couple days ago; pain getting worse since" (02/25/2012)  . Arthritis     "right wrist and hand" (02/25/2012)  . Enlarged prostate   . Carotid artery occlusion     ROS: [x]  Positive   [ ]  Denies    General: [ ]  Weight loss, [ ]  Fever, [ ]  chills Neurologic: [ ]  Dizziness, [ ]  Blackouts, [ ]  Seizure [ ]  Stroke, [ ]  "Mini stroke", [ ]  Slurred speech, [ ]  Temporary blindness; [ ]  weakness in arms or legs, [ ]  Hoarseness Cardiac: [ ]  Chest pain/pressure, [ ]  Shortness of breath at rest [ ]  Shortness of breath with exertion, [ ]  Atrial fibrillation or irregular heartbeat Vascular: [ ]  Pain in legs with walking, [ ]  Pain in legs at rest, [ ]  Pain in legs at night,  [ ]  Non-healing ulcer, [ ]  Blood clot in vein/DVT,   Pulmonary: [ ]  Home oxygen, [ ]  Productive cough, [ ]  Coughing up blood, [ ]  Asthma,  [ ]  Wheezing Musculoskeletal:  [ ]  Arthritis, [ ]  Low back pain, [ ]  Joint pain Hematologic: [ ]  Easy Bruising, [ ]  Anemia; [ ]  Hepatitis Gastrointestinal: [ ]  Blood in stool, [ ]  Gastroesophageal Reflux/heartburn, [ ]  Trouble swallowing Urinary: [ ]  chronic Kidney disease, [ ]  on HD - [ ]  MWF or [ ]  TTHS, [ ]  Burning with urination, [ ]  Difficulty urinating Skin: [ ]  Rashes, [ ]  Wounds Psychological: [ ]  Anxiety, [ ]  Depression   Social History History  Substance Use Topics  . Smoking status: Former Smoker -- 1.00 packs/day for 33 years    Types: Cigarettes   . Smokeless tobacco: Never Used     Comment: 02/25/2012 "quit smoking in 1980"  . Alcohol Use: No    Family History Family History  Problem Relation Age of Onset  . Family history unknown: Yes    No Known Allergies  Current Outpatient Prescriptions  Medication Sig Dispense Refill  . amLODipine (NORVASC) 5 MG tablet Take 5 mg by mouth daily.      Marland Kitchen atenolol (TENORMIN) 25 MG tablet Take 1 tablet (25 mg total) by mouth daily.      . colchicine 0.6 MG tablet Take 1 tablet (0.6 mg total) by mouth daily.      . digoxin (LANOXIN) 0.25 MG tablet Take 0.25 mg by mouth daily.      . quinapril (ACCUPRIL) 10 MG tablet Take 40 mg by mouth daily.      . Tamsulosin HCl (FLOMAX) 0.4 MG CAPS Take 0.4 mg by mouth daily.      Marland Kitchen triamcinolone cream (KENALOG) 0.1 % Apply 1 application topically 3 (three) times daily as needed.      . warfarin (COUMADIN) 5 MG tablet Take 5 mg by mouth daily.       No current facility-administered medications for this visit.    Physical Examination  Filed Vitals:  05/11/12 1143  BP: 160/74  Pulse: 67  Resp:     Body mass index is 25.12 kg/(m^2).  General:  WDWN in NAD Gait: Normal HEENT: WNL Eyes: Pupils equal Pulmonary: normal non-labored breathing , without Rales, rhonchi,  wheezing Cardiac: RRR, without  Murmurs, rubs or gallops; Abdomen: soft, NT, no masses Skin: no rashes, ulcers noted  Vascular Exam Pulses: 3+ radial pulses bilaterally Carotid bruits: Carotid pulses to auscultation no bruits are heard Extremities without ischemic changes, no Gangrene , no cellulitis; no open wounds;  Musculoskeletal: no muscle wasting or atrophy   Neurologic: A&O X 3; Appropriate Affect ; SENSATION: normal; MOTOR FUNCTION:  moving all extremities equally. Speech is fluent/normal  Non-Invasive Vascular Imaging CAROTID DUPLEX 05/11/2012  Right ICA 0 - 19% stenosis Left ICA 20 - 39 % stenosis   ASSESSMENT/PLAN: Asymptomatic patient with stable carotid  duplex from one year ago. The patient will followup in one year with repeat carotid duplex. The patient's questions were encouraged and answered, he is in agreement with this plan.  Lauree Chandler ANP   Clinic MD: Edilia Bo

## 2012-05-11 NOTE — Addendum Note (Signed)
Addended by: Sharee Pimple on: 05/11/2012 12:15 PM   Modules accepted: Orders

## 2012-08-06 ENCOUNTER — Encounter (HOSPITAL_COMMUNITY): Payer: Self-pay | Admitting: *Deleted

## 2012-08-06 ENCOUNTER — Emergency Department (HOSPITAL_COMMUNITY)
Admission: EM | Admit: 2012-08-06 | Discharge: 2012-08-06 | Disposition: A | Discharge status: Home / Self Care | Payer: Medicare Other | Attending: Emergency Medicine | Admitting: Emergency Medicine

## 2012-08-06 DIAGNOSIS — R319 Hematuria, unspecified: Secondary | ICD-10-CM

## 2012-08-06 DIAGNOSIS — Z8739 Personal history of other diseases of the musculoskeletal system and connective tissue: Secondary | ICD-10-CM | POA: Insufficient documentation

## 2012-08-06 DIAGNOSIS — I4891 Unspecified atrial fibrillation: Secondary | ICD-10-CM | POA: Insufficient documentation

## 2012-08-06 DIAGNOSIS — I10 Essential (primary) hypertension: Secondary | ICD-10-CM | POA: Insufficient documentation

## 2012-08-06 DIAGNOSIS — Z8719 Personal history of other diseases of the digestive system: Secondary | ICD-10-CM | POA: Insufficient documentation

## 2012-08-06 DIAGNOSIS — R300 Dysuria: Secondary | ICD-10-CM | POA: Insufficient documentation

## 2012-08-06 DIAGNOSIS — Z79899 Other long term (current) drug therapy: Secondary | ICD-10-CM | POA: Insufficient documentation

## 2012-08-06 DIAGNOSIS — Z7901 Long term (current) use of anticoagulants: Secondary | ICD-10-CM | POA: Insufficient documentation

## 2012-08-06 DIAGNOSIS — Z87891 Personal history of nicotine dependence: Secondary | ICD-10-CM | POA: Insufficient documentation

## 2012-08-06 DIAGNOSIS — Z8679 Personal history of other diseases of the circulatory system: Secondary | ICD-10-CM | POA: Insufficient documentation

## 2012-08-06 DIAGNOSIS — N400 Enlarged prostate without lower urinary tract symptoms: Secondary | ICD-10-CM | POA: Insufficient documentation

## 2012-08-06 DIAGNOSIS — Z85830 Personal history of malignant neoplasm of bone: Secondary | ICD-10-CM | POA: Insufficient documentation

## 2012-08-06 LAB — URINALYSIS, ROUTINE W REFLEX MICROSCOPIC
Nitrite: NEGATIVE
Specific Gravity, Urine: 1.021 (ref 1.005–1.030)
Urobilinogen, UA: 1 mg/dL (ref 0.0–1.0)

## 2012-08-06 LAB — CBC WITH DIFFERENTIAL/PLATELET
Basophils Absolute: 0 10*3/uL (ref 0.0–0.1)
Eosinophils Absolute: 0.1 10*3/uL (ref 0.0–0.7)
Lymphocytes Relative: 20 % (ref 12–46)
Lymphs Abs: 1.5 10*3/uL (ref 0.7–4.0)
Neutrophils Relative %: 72 % (ref 43–77)
Platelets: 160 10*3/uL (ref 150–400)
RBC: 3.95 MIL/uL — ABNORMAL LOW (ref 4.22–5.81)
RDW: 14.8 % (ref 11.5–15.5)
WBC: 7.8 10*3/uL (ref 4.0–10.5)

## 2012-08-06 LAB — BASIC METABOLIC PANEL
CO2: 18 mEq/L — ABNORMAL LOW (ref 19–32)
GFR calc non Af Amer: 38 mL/min — ABNORMAL LOW (ref >90)
Glucose, Bld: 131 mg/dL — ABNORMAL HIGH (ref 70–99)
Potassium: 4.4 mEq/L (ref 3.5–5.1)
Sodium: 137 mEq/L (ref 135–145)

## 2012-08-06 LAB — PROTIME-INR
INR: 2.16 — ABNORMAL HIGH (ref 0.00–1.49)
Prothrombin Time: 23.2 seconds — ABNORMAL HIGH (ref 11.6–15.2)

## 2012-08-06 LAB — URINE MICROSCOPIC-ADD ON

## 2012-08-06 MED ORDER — CEPHALEXIN 250 MG PO CAPS
500.0000 mg | ORAL_CAPSULE | Freq: Once | ORAL | Status: AC
  Administered 2012-08-06: 500 mg via ORAL
  Filled 2012-08-06: qty 2

## 2012-08-06 MED ORDER — CEPHALEXIN 500 MG PO CAPS: 500.0000 mg | ORAL_CAPSULE | Freq: Four times a day (QID) | ORAL | Status: DC

## 2012-08-06 NOTE — ED Provider Notes (Signed)
History     CSN: [id number]  Arrival date & time 08/06/12  1531   First MD Initiated Contact with Patient 08/06/12 1542      Chief Complaint  Patient presents with  . Hematuria     HPI Pt reports blood in his urine since yesterday, mild burning pain with urination. No acute distress noted at triage.  Patient denies fever or chills.  Denies history of bladder problems in the past.  Currently takes Coumadin because of atrial fibrillation.  Has his INR checked on monthly basis.  Past Medical History  Diagnosis Date  . Hypertension   . Diverticula of colon   . Atrial fibrillation   . Skin cancer of face   . Knee pain, right     "twisted it couple days ago; pain getting worse since" (02/25/2012)  . Arthritis     "right wrist and hand" (02/25/2012)  . Enlarged prostate   . Carotid artery occlusion     Past Surgical History  Procedure Laterality Date  . Colon resection  ~ 2010  . Inguinal hernia repair  ~ 2011    "left" (02/25/2012)  . Total hip arthroplasty  1980's    "right" (02/25/2012)  . Skin cancer excision      "q once in awhile; on my face" (02/25/2012)  . Carotid endarterectomy  12/02/2009    RIGHT  cea    History reviewed. No pertinent family history.  History  Substance Use Topics  . Smoking status: Former Smoker -- 1.00 packs/day for 33 years    Types: Cigarettes  . Smokeless tobacco: Never Used     Comment: 02/25/2012 "quit smoking in 1980"  . Alcohol Use: No      Review of Systems All other systems reviewed and are negative Allergies  Review of patient's allergies indicates no known allergies.  Home Medications   Current Outpatient Rx  Name  Route  Sig  Dispense  Refill  . amLODipine (NORVASC) 5 MG tablet   Oral   Take 5 mg by mouth daily.         Marland Kitchen atenolol (TENORMIN) 25 MG tablet   Oral   Take 1 tablet (25 mg total) by mouth daily.         . colchicine 0.6 MG tablet   Oral   Take 1 tablet (0.6 mg total) by mouth daily.          . digoxin (LANOXIN) 0.25 MG tablet   Oral   Take 0.25 mg by mouth daily.         . quinapril (ACCUPRIL) 10 MG tablet   Oral   Take 40 mg by mouth daily.         . Tamsulosin HCl (FLOMAX) 0.4 MG CAPS   Oral   Take 0.4 mg by mouth daily.         Marland Kitchen warfarin (COUMADIN) 5 MG tablet   Oral   Take 5 mg by mouth daily.         . cephALEXin (KEFLEX) 500 MG capsule   Oral   Take 1 capsule (500 mg total) by mouth 4 (four) times daily.   40 capsule   0     BP 157/77  Pulse 76  Temp(Src) 97.5 F (36.4 C) (Oral)  Resp 16  SpO2 100%  Physical Exam  Nursing note and vitals reviewed. Constitutional: He is oriented to person, place, and time. He appears well-developed and well-nourished. No distress.  HENT:  Head: Normocephalic and atraumatic.  Eyes: Pupils are equal, round, and reactive to light.  Neck: Normal range of motion.  Cardiovascular: Normal rate and intact distal pulses.   Pulmonary/Chest: No respiratory distress.  Abdominal: Normal appearance. He exhibits no distension. There is no tenderness. There is no rebound and no guarding.  Musculoskeletal: Normal range of motion.  Neurological: He is alert and oriented to person, place, and time. No cranial nerve deficit.  Skin: Skin is warm and dry. No rash noted.  Psychiatric: He has a normal mood and affect. His behavior is normal.    ED Course  Procedures (including critical care time) Medications  cephALEXin (KEFLEX) capsule 500 mg (500 mg Oral Given 08/06/12 1816)    Labs Reviewed  URINALYSIS, ROUTINE W REFLEX MICROSCOPIC - Abnormal; Notable for the following:    Color, Urine RED (*)    APPearance TURBID (*)    Hgb urine dipstick LARGE (*)    Bilirubin Urine MODERATE (*)    Ketones, ur 15 (*)    Protein, ur 100 (*)    Leukocytes, UA LARGE (*)    All other components within normal limits  CBC WITH DIFFERENTIAL - Abnormal; Notable for the following:    RBC 3.95 (*)    Hemoglobin 10.9 (*)    HCT 32.8 (*)     All other components within normal limits  BASIC METABOLIC PANEL - Abnormal; Notable for the following:    CO2 18 (*)    Glucose, Bld 131 (*)    BUN 53 (*)    Creatinine, Ser 1.58 (*)    GFR calc non Af Amer 38 (*)    GFR calc Af Amer 44 (*)    All other components within normal limits  PROTIME-INR - Abnormal; Notable for the following:    Prothrombin Time 23.2 (*)    INR 2.16 (*)    All other components within normal limits  URINE MICROSCOPIC-ADD ON - Abnormal; Notable for the following:    Squamous Epithelial / LPF FEW (*)    All other components within normal limits  URINE CULTURE   No results found.   1. Hematuria       MDM          Nelia Shi, MD 08/07/12 [phone]

## 2012-08-06 NOTE — ED Notes (Addendum)
Pt reports blood in his urine since yesterday, mild burning pain with urination. No acute distress noted at triage. Reports falling two nights ago.

## 2012-08-09 LAB — URINE CULTURE: Colony Count: >=100000

## 2012-08-10 NOTE — ED Notes (Signed)
Post ED Visit - Positive Culture Follow-up  Culture report reviewed by antimicrobial stewardship pharmacist: []  Wes Dulaney, Pharm.D., BCPS []  Celedonio Miyamoto, [address].D., BCPS []  Georgina Pillion, Pharm.D., BCPS [x]  [place], [address].D., BCPS, AAHIVP []  Estella Husk, Pharm.D., BCPS, AAHIV  Positive urine culture Treated with Cephalexin  organism sensitive to the same and no further patient follow-up is required at this time.  Larena Sox 08/10/2012, 5:20 PM

## 2013-04-21 ENCOUNTER — Other Ambulatory Visit: Payer: Self-pay | Admitting: Neurosurgery

## 2013-04-21 DIAGNOSIS — I6529 Occlusion and stenosis of unspecified carotid artery: Secondary | ICD-10-CM

## 2013-04-25 ENCOUNTER — Encounter: Payer: Self-pay | Admitting: Interventional Cardiology

## 2013-05-16 ENCOUNTER — Other Ambulatory Visit (HOSPITAL_COMMUNITY): Payer: PRIVATE HEALTH INSURANCE

## 2013-05-16 ENCOUNTER — Ambulatory Visit: Payer: Medicare Other | Admitting: Family

## 2013-05-25 ENCOUNTER — Encounter: Payer: Self-pay | Admitting: Interventional Cardiology

## 2013-06-05 ENCOUNTER — Encounter: Payer: Self-pay | Admitting: Family

## 2013-06-06 ENCOUNTER — Ambulatory Visit (HOSPITAL_COMMUNITY)
Admission: RE | Admit: 2013-06-06 | Discharge: 2013-06-06 | Disposition: A | Discharge status: Home / Self Care | Payer: Medicare HMO | Source: Ambulatory Visit | Attending: Family | Admitting: Family

## 2013-06-06 ENCOUNTER — Ambulatory Visit (INDEPENDENT_AMBULATORY_CARE_PROVIDER_SITE_OTHER): Payer: Commercial Managed Care - HMO | Admitting: Family

## 2013-06-06 ENCOUNTER — Encounter: Payer: Self-pay | Admitting: Family

## 2013-06-06 VITALS — BP 167/81 | HR 69 | Resp 14 | Ht 71.0 in | Wt 177.0 lb

## 2013-06-06 DIAGNOSIS — I739 Peripheral vascular disease, unspecified: Secondary | ICD-10-CM

## 2013-06-06 DIAGNOSIS — I6529 Occlusion and stenosis of unspecified carotid artery: Secondary | ICD-10-CM

## 2013-06-06 DIAGNOSIS — Z48812 Encounter for surgical aftercare following surgery on the circulatory system: Secondary | ICD-10-CM

## 2013-06-06 DIAGNOSIS — I779 Disorder of arteries and arterioles, unspecified: Secondary | ICD-10-CM | POA: Insufficient documentation

## 2013-06-06 NOTE — Progress Notes (Signed)
Established Carotid Patient   History of Present Illness  [name] is a 80 y.o. male patient of Dr. Donnetta Hutching status post right CEA in 2011. He returns today for routine surveillance.  He has pain in right hip, had a total hip replacement, still has right hip pain. He denies claudication symptoms in legs, denies non healing wounds. He denies history of stroke, TIA, or MI.   Patient denies New Medical or Surgical History.  Pt Diabetic: No Pt smoker: non-smoker  Pt meds include: Statin : No: states his cholesterol is OK ASA: No Other anticoagulants/antiplatelets: takes coumadin for atrial fib   Past Medical History  Diagnosis Date  . Hypertension   . Diverticula of colon   . Atrial fibrillation   . Skin cancer of face   . Knee pain, right     "twisted it couple days ago; pain getting worse since" (02/25/2012)  . Arthritis     "right wrist and hand" (02/25/2012)  . Enlarged prostate   . Carotid artery occlusion     Social History History  Substance Use Topics  . Smoking status: Former Smoker -- 1.00 packs/day for 33 years    Types: Cigarettes  . Smokeless tobacco: Never Used     Comment: 02/25/2012 "quit smoking in 1980"  . Alcohol Use: No    Family History Family History  Problem Relation Age of Onset  . Heart disease Father     Heart Disease before age 47    Surgical History Past Surgical History  Procedure Laterality Date  . Colon resection  ~ 2010  . Inguinal hernia repair  ~ 2011    "left" (02/25/2012)  . Total hip arthroplasty  1980's    "right" (02/25/2012)  . Skin cancer excision      "q once in awhile; on my face" (02/25/2012)  . Carotid endarterectomy  12/02/2009    RIGHT  cea    No Known Allergies  Current Outpatient Prescriptions  Medication Sig Dispense Refill  . amLODipine (NORVASC) 5 MG tablet Take 5 mg by mouth daily.      Marland Kitchen atenolol (TENORMIN) 25 MG tablet Take 1 tablet (25 mg total) by mouth daily.      . cephALEXin (KEFLEX) 500  MG capsule Take 1 capsule (500 mg total) by mouth 4 (four) times daily.  40 capsule  0  . colchicine 0.6 MG tablet Take 1 tablet (0.6 mg total) by mouth daily.      . digoxin (LANOXIN) 0.25 MG tablet Take 0.25 mg by mouth daily.      . quinapril (ACCUPRIL) 10 MG tablet Take 40 mg by mouth daily.      . Tamsulosin HCl (FLOMAX) 0.4 MG CAPS Take 0.4 mg by mouth daily.      Marland Kitchen warfarin (COUMADIN) 5 MG tablet Take 5 mg by mouth daily.       No current facility-administered medications for this visit.    Review of Systems : See HPI for pertinent positives and negatives.  Physical Examination  Filed Vitals:   06/06/13 1138  BP: 167/81  Pulse: 69  Resp: 14   Filed Weights   06/06/13 1138  Weight: 177 lb (80.287 kg)   Body mass index is 24.7 kg/(m^2).   General: WDWN male in NAD GAIT: shuffling Eyes: PERRLA Pulmonary:  Non-labored, CTAB, Negative  Rales, Negative rhonchi, & Negative wheezing.  Cardiac: irregular Rhythm ,  Negative detected murmur.  VASCULAR EXAM Carotid Bruits Left Right   Positive Negative  Radial pulses are 3+ palpable and equal.                                                                                                                            LE Pulses LEFT RIGHT       POPLITEAL  not palpable   not palpable    Gastrointestinal: soft, nontender, BS WNL, no r/g,  negative masses.  Musculoskeletal: Negative muscle atrophy/wasting. M/S 5/5 throughout, Extremities without ischemic changes.  Neurologic: A&O X 3; Appropriate Affect ; SENSATION ;normal;  Speech is normal CN 2-12 intact, Pain and light touch intact in extremities, Motor exam as listed above.   Non-Invasive Vascular Imaging CAROTID DUPLEX 06/06/2013   CEREBROVASCULAR DUPLEX EVALUATION    INDICATION: Follow up carotid artery disease    PREVIOUS INTERVENTION(S): Right carotid endarterectomy 12/02/2009    DUPLEX EXAM: Carotid duplex    RIGHT  LEFT  Peak Systolic Velocities (cm/s)  End Diastolic Velocities (cm/s) Plaque LOCATION Peak Systolic Velocities (cm/s) End Diastolic Velocities (cm/s) Plaque  120 10 HT CCA PROXIMAL 97 17 HT  77 7 HT CCA MID 115 20 HT  82 10 - CCA DISTAL 106 40 -  94 12 - ECA 127 8 HT  33 9 - ICA PROXIMAL 96 13 HT  70 18 - ICA MID 94 24 -  57 13 - ICA DISTAL 110 28 -    N/A ICA / CCA Ratio (PSV) .95  Antegrade Vertebral Flow Antegrade  379 Brachial Systolic Pressure (mmHg) 024  Triphasic Brachial Artery Waveforms Triphasic    Plaque Morphology:  HM = Homogeneous, HT = Heterogeneous, CP = Calcific Plaque, SP = Smooth Plaque, IP = Irregular Plaque     ADDITIONAL FINDINGS:     IMPRESSION: 1. Patent right carotid endarterectomy site with no evidence for restenosis. 2. Less than 40% left internal carotid artery stenosis.    Compared to the previous exam:  No change      Assessment: [name] is a 80 y.o. male who is s/p right carotid endarterectomy 12/02/2009 and presents with asymptomatic patent right carotid endarterectomy site with no evidence for restenosis. Less than 40% left internal carotid artery stenosis. The  ICA stenosis is  Unchanged from previous exam.  Plan: Follow-up in 1 year with Carotid Duplex scan.   I discussed in depth with the patient the nature of atherosclerosis, and emphasized the importance of maximal medical management including strict control of blood pressure, blood glucose, and lipid levels, obtaining regular exercise, and continued cessation of smoking.  The patient is aware that without maximal medical management the underlying atherosclerotic disease process will progress, limiting the benefit of any interventions. The patient was given information about stroke prevention and what symptoms should prompt the patient to seek immediate medical care. Thank you for allowing [place] to participate in this patient's care.  Clemon Chambers, RN, MSN, FNP-C [facility] Office:  [phone]  Clinic Physician: Early  06/06/2013 12:00 PM

## 2013-06-06 NOTE — Patient Instructions (Signed)

## 2013-06-21 ENCOUNTER — Ambulatory Visit: Payer: Medicare Other | Admitting: Interventional Cardiology

## 2013-07-04 ENCOUNTER — Ambulatory Visit: Payer: Medicare Other | Admitting: Interventional Cardiology

## 2013-08-08 ENCOUNTER — Encounter: Payer: Self-pay | Admitting: Interventional Cardiology

## 2013-08-08 ENCOUNTER — Ambulatory Visit (INDEPENDENT_AMBULATORY_CARE_PROVIDER_SITE_OTHER): Payer: Commercial Managed Care - HMO | Admitting: Interventional Cardiology

## 2013-08-08 VITALS — BP 118/70 | HR 68 | Ht 71.0 in | Wt 178.0 lb

## 2013-08-08 DIAGNOSIS — I4891 Unspecified atrial fibrillation: Secondary | ICD-10-CM

## 2013-08-08 DIAGNOSIS — I059 Rheumatic mitral valve disease, unspecified: Secondary | ICD-10-CM | POA: Insufficient documentation

## 2013-08-08 DIAGNOSIS — I10 Essential (primary) hypertension: Secondary | ICD-10-CM

## 2013-08-08 NOTE — Progress Notes (Signed)
Patient ID: DAVINCI GLOTFELTY, male   DOB: 1923-07-25, 80 y.o.   MRN: [id number]    [place], [address], [place]  [address] Phone: [phone] Fax:  [phone]  Date:  08/08/2013   ID:  [name], DOB [birth year], MRN [id number]  PCP:  Kandice Hams, MD      History of Present Illness: [name] is a 80 y.o. male with a h/o AFib. He had a right CEA in 2011. He had a hernia operation in 2012. He has not been walking regularly due to hip pain. No bleeding problems. No shortness of breath. BP better controlled since amlodine. No PND, orhtponea. No leg swelling. Atrial Fibrillation F/U:  Denies : Chest pain.  Dizziness.  Leg edema.  Orthopnea.  Palpitations.  Syncope.     Wt Readings from Last 3 Encounters:  08/08/13 178 lb (80.74 kg)  06/06/13 177 lb (80.287 kg)  05/11/12 180 lb (81.647 kg)     Past Medical History  Diagnosis Date  . Hypertension   . Diverticula of colon   . Atrial fibrillation   . Skin cancer of face   . Knee pain, right     "twisted it couple days ago; pain getting worse since" (02/25/2012)  . Arthritis     "right wrist and hand" (02/25/2012)  . Enlarged prostate   . Carotid artery occlusion     Current Outpatient Prescriptions  Medication Sig Dispense Refill  . amLODipine (NORVASC) 5 MG tablet Take 5 mg by mouth daily.      Marland Kitchen atenolol (TENORMIN) 25 MG tablet Take 1 tablet (25 mg total) by mouth daily.      . cephALEXin (KEFLEX) 500 MG capsule Take 1 capsule (500 mg total) by mouth 4 (four) times daily.  40 capsule  0  . colchicine 0.6 MG tablet Take 1 tablet (0.6 mg total) by mouth daily.      . digoxin (LANOXIN) 0.25 MG tablet Take 0.25 mg by mouth daily.      . quinapril (ACCUPRIL) 10 MG tablet Take 40 mg by mouth daily.      . Tamsulosin HCl (FLOMAX) 0.4 MG CAPS Take 0.4 mg by mouth daily.      Marland Kitchen warfarin (COUMADIN) 5 MG tablet Take 5 mg by mouth daily.       No current facility-administered medications for this visit.     Allergies:   No Known Allergies  Social History:  The patient  reports that he has quit smoking. His smoking use included Cigarettes. He has a 33 pack-year smoking history. He has never used smokeless tobacco. He reports that he does not drink alcohol or use illicit drugs.   Family History:  The patient's family history includes Heart disease in his father.   ROS:  Please see the history of present illness.  No nausea, vomiting.  No fevers, chills.  No focal weakness.  No dysuria.    All other systems reviewed and negative.   PHYSICAL EXAM: VS:  BP 118/70  Pulse 68  Ht 5\' 11"  (1.803 m)  Wt 178 lb (80.74 kg)  BMI 24.84 kg/m2 Well nourished, well developed, in no acute distress HEENT: normal Neck: no JVD, no carotid bruits Cardiac:  normal S1, S2; irregularly irregular;  Lungs:  clear to auscultation bilaterally, no wheezing, rhonchi or rales Abd: soft, nontender, no hepatomegaly Ext: no edema Skin: warm and dry Neuro:   no focal abnormalities noted  EKG:  AFib, rate controlled  ASSESSMENT AND PLAN:  Atrial fibrillation  Notes: Rate controlled. COumadin for stroke prevention. benefits of Coumadin seemed to outweigh the risk at this time. He is careful to avoid falls. 2. Essential hypertension, benign  Continue Accupril Tablet, 40 MG, 1 tablet, Orally, Once a day Continue Amlodipine Besylate Tablet, 5 MG, 1 tablet, Orally, Once a day, 30 day(s), 30, Refills 11 Notes: BP well controlled. He needs to check outside of doctor's office. He has a cuff at home. No elevated readings there.  3. Mitral regurgitation  Notes: No CHF sx. Not a candidate for a valve repair. No sx at this time. LVEF was low normal. Exertion limited by hip pain, not cardiac issues.    Signed, Mina Marble, MD, [employer] 08/08/2013 10:46 AM

## 2013-08-08 NOTE — Patient Instructions (Signed)
Your physician recommends that you continue on your current medications as directed. Please refer to the Current Medication list given to you today.  Your physician wants you to follow-up in: 1 year with Dr. Varanasi. You will receive a reminder letter in the mail two months in advance. If you don't receive a letter, please call our office to schedule the follow-up appointment.  

## 2014-01-31 ENCOUNTER — Ambulatory Visit: Payer: Commercial Managed Care - HMO | Admitting: Cardiology

## 2014-01-31 ENCOUNTER — Telehealth: Payer: Self-pay | Admitting: *Deleted

## 2014-01-31 ENCOUNTER — Telehealth: Payer: Self-pay | Admitting: Interventional Cardiology

## 2014-01-31 MED ORDER — ATENOLOL 50 MG PO TABS: 50.0000 mg | ORAL_TABLET | Freq: Every day | ORAL | Status: DC

## 2014-01-31 NOTE — Telephone Encounter (Signed)
Received direct call from scheduling that pt's daughter was on phone asking about treatment plan.  I talked with daughter who reports pt is currently sitting in chair and not having any problems.  He is afraid heart rate will go up when he gets up. He has not had any episodes since speaking with me earlier today.  Daughter would like pt seen today.  Will review with DOD.

## 2014-01-31 NOTE — Telephone Encounter (Signed)
Reviewed with Dr. Marlou Porch and he can see pt today. I spoke with pt and gave him this information. Pt reports heart rate is now OK. Pt does not know if he needs office visit today.  He then gave phone to daughter Donnelly Angelica) who would like pt seen today.  Donnelly Angelica aware appt has been made for pt to see Dr. Marlou Porch today at 3:15 and will be here for appt.

## 2014-01-31 NOTE — Telephone Encounter (Signed)
New message           C/o cp and heart racing

## 2014-01-31 NOTE — Telephone Encounter (Signed)
Spoke with pt who states he is agreeable to medication change as ordered by Dr Marlou Porch to better control his HR.  Pt is to increase Atenolol to 50 mg a day and stop his Amlodipine.  He was scheduled to follow up with Dr Irish Lack at the pts convenience.  He will call back with any concerns or issues.

## 2014-01-31 NOTE — Telephone Encounter (Signed)
Received direct call from scheduling and spoke with pt. He reports episode of heart racing earlier today while doing laundry. Lasted about 30-45 minutes. Returned to normal after he sat down and rested. No chest pain or shortness of breath with increased heart rate. States last time he had this was about 2 years ago. Is taking medications as listed on med list. At present time he is feeling fine. No chest pain, shortness of breath and heart rate is normal. Will forward to Dr. Irish Lack for review.

## 2014-03-20 ENCOUNTER — Encounter: Payer: Self-pay | Admitting: Interventional Cardiology

## 2014-03-20 ENCOUNTER — Ambulatory Visit (INDEPENDENT_AMBULATORY_CARE_PROVIDER_SITE_OTHER): Payer: Commercial Managed Care - HMO | Admitting: Interventional Cardiology

## 2014-03-20 VITALS — BP 150/78 | HR 66 | Ht 71.0 in | Wt 178.0 lb

## 2014-03-20 DIAGNOSIS — I10 Essential (primary) hypertension: Secondary | ICD-10-CM | POA: Diagnosis not present

## 2014-03-20 DIAGNOSIS — I4891 Unspecified atrial fibrillation: Secondary | ICD-10-CM

## 2014-03-20 DIAGNOSIS — I059 Rheumatic mitral valve disease, unspecified: Secondary | ICD-10-CM | POA: Diagnosis not present

## 2014-03-20 LAB — BASIC METABOLIC PANEL
BUN: 33 mg/dL — ABNORMAL HIGH (ref 6–23)
CHLORIDE: 114 meq/L — AB (ref 96–112)
CO2: 23 mEq/L (ref 19–32)
CREATININE: 1.4 mg/dL (ref 0.4–1.5)
Calcium: 9.8 mg/dL (ref 8.4–10.5)
GFR: 51.14 mL/min — ABNORMAL LOW (ref >60.00)
Glucose, Bld: 99 mg/dL (ref 70–99)
Potassium: 5 mEq/L (ref 3.5–5.1)
Sodium: 143 mEq/L (ref 135–145)

## 2014-03-20 NOTE — Patient Instructions (Signed)
Your physician wants you to follow-up in:  May 2016 You will receive a reminder letter in the mail two months in advance. If you don't receive a letter, please call our office to schedule the follow-up appointment.

## 2014-03-20 NOTE — Progress Notes (Signed)
Patient ID: [name], male   DOB: [birth year], 81 y.o.   MRN: [id number] Patient ID: [name], male   DOB: July 02, 1923, 81 y.o.   MRN: [id number]    [place], [place], [place]  [address] Phone: [phone] Fax:  [phone]  Date:  03/20/2014   ID:  [name], DOB Mar 17, 1923, MRN [id number]  PCP:  Kandice Hams, MD      History of Present Illness: [name] is a 81 y.o. male with a h/o AFib. He had a right CEA in 2011. He had a hernia operation in 2012. He has not been walking regularly due to hip pain. No bleeding problems. No shortness of breath. BP better controlled since amlodine. No PND, orhtponea. No leg swelling. Atrial Fibrillation F/U:  Denies : Chest pain.  Dizziness.  Leg edema.  Orthopnea.  Palpitations.  Syncope.   Family has tried to stop him from driving.   Wt Readings from Last 3 Encounters:  03/20/14 178 lb (80.74 kg)  08/08/13 178 lb (80.74 kg)  06/06/13 177 lb (80.287 kg)     Past Medical History  Diagnosis Date  . Hypertension   . Diverticula of colon   . Atrial fibrillation   . Skin cancer of face   . Knee pain, right     "twisted it couple days ago; pain getting worse since" (02/25/2012)  . Arthritis     "right wrist and hand" (02/25/2012)  . Enlarged prostate   . Carotid artery occlusion     Current Outpatient Prescriptions  Medication Sig Dispense Refill  . atenolol (TENORMIN) 50 MG tablet Take 1 tablet (50 mg total) by mouth daily. 30 tablet 11  . colchicine 0.6 MG tablet Take 1 tablet (0.6 mg total) by mouth daily.    . digoxin (LANOXIN) 0.25 MG tablet Take 0.25 mg by mouth daily.    . quinapril (ACCUPRIL) 10 MG tablet Take 40 mg by mouth daily.    . Tamsulosin HCl (FLOMAX) 0.4 MG CAPS Take 0.4 mg by mouth daily.    Marland Kitchen warfarin (COUMADIN) 5 MG tablet Take 5 mg by mouth daily.     No current facility-administered medications for this visit.    Allergies:   No Known Allergies  Social History:  The  patient  reports that he has quit smoking. His smoking use included Cigarettes. He has a 33 pack-year smoking history. He has never used smokeless tobacco. He reports that he does not drink alcohol or use illicit drugs.   Family History:  The patient's family history includes Heart disease in his father.   ROS:  Please see the history of present illness.  No nausea, vomiting.  No fevers, chills.  No focal weakness.  No dysuria.    All other systems reviewed and negative.   PHYSICAL EXAM: VS:  BP 150/78 mmHg  Pulse 66  Ht 5\' 11"  (1.803 m)  Wt 178 lb (80.74 kg)  BMI 24.84 kg/m2 Well nourished, well developed, in no acute distress HEENT: normal Neck: no JVD, no carotid bruits Cardiac:  normal S1, S2; irregularly irregular;  Lungs:  clear to auscultation bilaterally, no wheezing, rhonchi or rales Abd: soft, nontender, no hepatomegaly Ext: no edema Skin: warm and dry Neuro:   no focal abnormalities noted Psych: flat affect  EKG:  AFib, rate controlled    ASSESSMENT AND PLAN:  Atrial fibrillation  Notes: Rate controlled- better with the increased atenolol. COumadin for stroke prevention. benefits of  Coumadin seemed to outweigh the risk at this time. He is careful to avoid falls. Check digoxin level.  He took a dose this morning.  2. Essential hypertension, benign  Continue Accupril Tablet, 40 MG, 1 tablet, Orally, Once a day Continue Amlodipine Besylate Tablet, 5 MG, 1 tablet, Orally, Once a day, 30 day(s), 30, Refills 11 Notes: BP well controlled usually. He needs to check outside of doctor's office. He has a cuff at home. No elevated readings there.  3. Mitral regurgitation  Notes: No CHF sx. Not a candidate for a valve repair. No sx at this time. LVEF was low normal. Exertion limited by hip pain, not cardiac issues.    Signed, Mina Marble, MD, [facility] 03/20/2014 12:22 PM

## 2014-03-21 DIAGNOSIS — Z7901 Long term (current) use of anticoagulants: Secondary | ICD-10-CM | POA: Diagnosis not present

## 2014-03-21 LAB — DIGOXIN LEVEL: Digoxin Level: 1.4 ng/mL (ref 0.8–2.0)

## 2014-03-29 DIAGNOSIS — I4891 Unspecified atrial fibrillation: Secondary | ICD-10-CM | POA: Diagnosis not present

## 2014-04-13 DIAGNOSIS — I4891 Unspecified atrial fibrillation: Secondary | ICD-10-CM | POA: Diagnosis not present

## 2014-04-13 DIAGNOSIS — Z7901 Long term (current) use of anticoagulants: Secondary | ICD-10-CM | POA: Diagnosis not present

## 2014-05-29 DIAGNOSIS — L570 Actinic keratosis: Secondary | ICD-10-CM | POA: Diagnosis not present

## 2014-05-29 DIAGNOSIS — L309 Dermatitis, unspecified: Secondary | ICD-10-CM | POA: Diagnosis not present

## 2014-05-29 DIAGNOSIS — C44629 Squamous cell carcinoma of skin of left upper limb, including shoulder: Secondary | ICD-10-CM | POA: Diagnosis not present

## 2014-05-29 DIAGNOSIS — X32XXXD Exposure to sunlight, subsequent encounter: Secondary | ICD-10-CM | POA: Diagnosis not present

## 2014-05-29 DIAGNOSIS — L723 Sebaceous cyst: Secondary | ICD-10-CM | POA: Diagnosis not present

## 2014-06-07 ENCOUNTER — Other Ambulatory Visit (HOSPITAL_COMMUNITY): Payer: Commercial Managed Care - HMO

## 2014-06-07 ENCOUNTER — Ambulatory Visit: Payer: Commercial Managed Care - HMO | Admitting: Family

## 2014-08-09 ENCOUNTER — Ambulatory Visit (INDEPENDENT_AMBULATORY_CARE_PROVIDER_SITE_OTHER): Payer: Commercial Managed Care - HMO | Admitting: Interventional Cardiology

## 2014-08-09 ENCOUNTER — Encounter: Payer: Self-pay | Admitting: Interventional Cardiology

## 2014-08-09 VITALS — BP 158/70 | HR 70 | Ht 71.0 in | Wt 172.8 lb

## 2014-08-09 DIAGNOSIS — I10 Essential (primary) hypertension: Secondary | ICD-10-CM

## 2014-08-09 DIAGNOSIS — I779 Disorder of arteries and arterioles, unspecified: Secondary | ICD-10-CM | POA: Diagnosis not present

## 2014-08-09 DIAGNOSIS — I739 Peripheral vascular disease, unspecified: Principal | ICD-10-CM

## 2014-08-09 DIAGNOSIS — I482 Chronic atrial fibrillation, unspecified: Secondary | ICD-10-CM

## 2014-08-09 MED ORDER — AMLODIPINE BESYLATE 2.5 MG PO TABS: 2.5000 mg | ORAL_TABLET | Freq: Every day | ORAL | Status: DC

## 2014-08-09 NOTE — Progress Notes (Signed)
Patient ID: [name], male   DOB: 1923/11/01, 81 y.o.   MRN: [id number]     Cardiology Office Note   Date:  08/09/2014   ID:  [name], DOB 04/11/1923, MRN [id number]  PCP:  Kandice Hams, MD    No chief complaint on file. AFib, HTN   Wt Readings from Last 3 Encounters:  08/09/14 172 lb 12.8 oz (78.382 kg)  03/20/14 178 lb (80.74 kg)  08/08/13 178 lb (80.74 kg)       History of Present Illness: [name] is a 81 y.o. male  with a h/o AFib. He had a right CEA in 2011. He had a hernia operation in 2012. He has not been walking regularly due to hip pain. No bleeding problems. No shortness of breath. BP had been better controlled since starting amlodine, but this is no longer on his medicine list. He has not been checking his blood pressure at home.Marland Kitchen No PND, orhtponea. No leg swelling. Atrial Fibrillation F/U:  Denies : Chest pain.  Dizziness.  Leg edema.  Orthopnea.  Palpitations.  Syncope.   Family has tried to stop him from driving. His only complaint is that his balance is not good. He denies any episodes of fast heartbeats.    Past Medical History  Diagnosis Date  . Hypertension   . Diverticula of colon   . Atrial fibrillation   . Skin cancer of face   . Knee pain, right     "twisted it couple days ago; pain getting worse since" (02/25/2012)  . Arthritis     "right wrist and hand" (02/25/2012)  . Enlarged prostate   . Carotid artery occlusion     Past Surgical History  Procedure Laterality Date  . Colon resection  ~ 2010  . Inguinal hernia repair  ~ 2011    "left" (02/25/2012)  . Total hip arthroplasty  1980's    "right" (02/25/2012)  . Skin cancer excision      "q once in awhile; on my face" (02/25/2012)  . Carotid endarterectomy  12/02/2009    RIGHT  cea     Current Outpatient Prescriptions  Medication Sig Dispense Refill  . atenolol (TENORMIN) 50 MG tablet Take 1 tablet (50 mg total) by mouth daily. 30 tablet 11  . colchicine 0.6 MG  tablet Take 1 tablet (0.6 mg total) by mouth daily.    . digoxin (LANOXIN) 0.25 MG tablet Take 0.25 mg by mouth daily.    . quinapril (ACCUPRIL) 10 MG tablet Take 40 mg by mouth daily.    . Tamsulosin HCl (FLOMAX) 0.4 MG CAPS Take 0.4 mg by mouth daily.    Marland Kitchen warfarin (COUMADIN) 5 MG tablet Take 5 mg by mouth daily.     No current facility-administered medications for this visit.    Allergies:   Review of patient's allergies indicates no known allergies.    Social History:  The patient  reports that he has quit smoking. His smoking use included Cigarettes. He has a 33 pack-year smoking history. He has never used smokeless tobacco. He reports that he does not drink alcohol or use illicit drugs.   Family History:  The patient's *family history includes Cancer in his sister; Healthy in his mother; Heart attack in his brother and father; Heart disease in his brother, brother, and father; Hypertension in his brother, brother, and brother; Stroke in his brother and brother.    ROS:  Please see the history of present illness.   Otherwise,  review of systems are positive for joint pains, poor balance.   All other systems are reviewed and negative.    PHYSICAL EXAM: VS:  BP 158/70 mmHg  Pulse 70  Ht 5\' 11"  (1.803 m)  Wt 172 lb 12.8 oz (78.382 kg)  BMI 24.11 kg/m2  SpO2 96% , BMI Body mass index is 24.11 kg/(m^2). GEN: Well nourished, well developed, in no acute distress HEENT: normal Neck: no JVD, carotid bruits, or masses Cardiac: Irregularly irregular; no murmurs, rubs, or gallops,no edema  Respiratory:  clear to auscultation bilaterally, normal work of breathing GI: soft, nontender, nondistended, + BS MS: no deformity or atrophy Skin: warm and dry, no rash Neuro:  Strength and sensation are intact Psych: euthymic mood, full affect      Recent Labs: 03/20/2014: BUN 33*; Creatinine 1.4; Potassium 5.0; Sodium 143   Lipid Panel No results found for: CHOL, TRIG, HDL, CHOLHDL, VLDL,  LDLCALC, LDLDIRECT   Other studies Reviewed: Additional studies/ records that were reviewed today with results demonstrating: Patent right carotid endarterectomy site. Mild left internal carotid stenosis.. Digoxin level I.4 in January 2016.   ASSESSMENT AND PLAN:  1. Atrial fibrillation: Rate controlled. Coumadin for stroke prevention. I encouraged him to use a cane to avoid falling as falling could be very serious, especially since he is on a blood thinner.  2. Hypertension: Not sure how he got taken off of amlodipine. His blood pressure is high today. 3 check was 168/80. Will add back amlodipine 2.5 mg daily. Have asked him to check his blood pressure at home several times a week after he rests for at least 10 minutes at a time. He does have a cuff at home. He will report back to [place] with some of the readings. We may need to increase the dosage of amlodipine. He will follow-up with an extender for a blood pressure check in a couple of months. 3. Carotid artery disease: Given his peripheral vascular disease, he would benefit from a statin. We'll try to see if we can get him on at least a low-dose statin. His lipids have been followed by primary care.   Current medicines are reviewed at length with the patient today.  The patient concerns regarding his medicines were addressed.  The following changes have been made:  No change  Labs/ tests ordered today include:  No orders of the defined types were placed in this encounter.    Recommend 150 minutes/week of aerobic exercise Low fat, low carb, high fiber diet recommended  Disposition:   FU in 2 months   Teresita Madura., MD  08/09/2014 12:34 PM    [facility] [place], [place], [place]  [address] Phone: [phone]; Fax: [phone]

## 2014-08-09 NOTE — Patient Instructions (Signed)
Medication Instructions:  Start taking Amlodipine 2.5 mg daily  Labwork: None  Testing/Procedures: None  Follow-Up: Your physician recommends that you schedule a follow-up appointment in: 6 weeks with a PA or NP to check BP since starting Amlodipine.  Your physician wants you to follow-up in: 1 year. You will receive a reminder letter in the mail two months in advance. If you don't receive a letter, please call our office to schedule the follow-up appointment.    Marland Kitchen

## 2014-08-16 DIAGNOSIS — I10 Essential (primary) hypertension: Secondary | ICD-10-CM | POA: Diagnosis not present

## 2014-08-16 DIAGNOSIS — E780 Pure hypercholesterolemia: Secondary | ICD-10-CM | POA: Diagnosis not present

## 2014-08-16 DIAGNOSIS — N183 Chronic kidney disease, stage 3 (moderate): Secondary | ICD-10-CM | POA: Diagnosis not present

## 2014-08-16 DIAGNOSIS — I4891 Unspecified atrial fibrillation: Secondary | ICD-10-CM | POA: Diagnosis not present

## 2014-09-19 NOTE — Progress Notes (Signed)
Cardiology Office Note   Date:  09/20/2014   ID:  Deeann Dowse, DOB 08/03/1923, MRN [id number]  PCP:  Kandice Hams, MD  Cardiologist:  Dr. Casandra Doffing     Chief Complaint  Patient presents with  . Follow-up    Blood pressure     History of Present Illness: [name] is a 81 y.o. male with a hx of carotid stenosis status post right CEA in 2011, minimal coronary artery plaque by cardiac catheterization in 2002, chronic atrial fibrillation on Coumadin anticoagulation, HTN, mitral regurgitation (not a candidate for surgery), ex-smoker.  Last seen by Dr. Irish Lack 08/09/14. Blood pressure was uncontrolled. He was previously taken off of amlodipine for unclear reasons. This was resumed at 2.5 mg daily.  He returns for follow-up. He is here with his daughter today. It seems like there is a lot of confusion with his medications. He did not get the Norvasc 2.5 mg tablets refilled after running out. He did have 5 mg tablets at home and has been taking these for a week or more now. We have him listed as taking atenolol 50 mg daily. However, he has been taking 25 mg daily. His daughter is not aware of him getting his INR checked. However, he states that he has been going to his primary care doctor for INR checks. He denies chest pain, significant dyspnea. Of note, he has slow, festering gait. He denies orthopnea, PND or significant edema. He denies syncope.   Studies/Reports Reviewed Today:  Carotid US 3/79/02 Patent R CEA LICA < 40%  Echo 97/3/53 EF 50-55%, moderate concentric LVH BAE Moderate to severe MR Mild AI, mild aortic root dilatation  LHC 04/2000 LM: No significant disease LAD: Okay LCx: Irregularities with 30-40% prior to the OM1 RCA: No significant disease EF 30%   Past Medical History  Diagnosis Date  . Hypertension   . Diverticula of colon   . Atrial fibrillation   . Skin cancer of face   . Knee pain, right     "twisted it couple days ago; pain getting  worse since" (02/25/2012)  . Arthritis     "right wrist and hand" (02/25/2012)  . Enlarged prostate   . Carotid artery occlusion     Past Surgical History  Procedure Laterality Date  . Colon resection  ~ 2010  . Inguinal hernia repair  ~ 2011    "left" (02/25/2012)  . Total hip arthroplasty  1980's    "right" (02/25/2012)  . Skin cancer excision      "q once in awhile; on my face" (02/25/2012)  . Carotid endarterectomy  12/02/2009    RIGHT  cea     Current Outpatient Prescriptions  Medication Sig Dispense Refill  . amLODipine (NORVASC) 2.5 MG tablet Take 1 tablet (2.5 mg total) by mouth daily. 30 tablet 3  . atenolol (TENORMIN) 50 MG tablet Take 1 tablet (50 mg total) by mouth daily. 30 tablet 11  . digoxin (LANOXIN) 0.125 MG tablet Take 1 tablet (0.125 mg total) by mouth daily. 90 tablet 3  . Omega-3 Fatty Acids (FISH OIL) 1000 MG CAPS Take 1,000 mg by mouth daily.    . quinapril (ACCUPRIL) 10 MG tablet Take 40 mg by mouth daily.    . Tamsulosin HCl (FLOMAX) 0.4 MG CAPS Take 0.4 mg by mouth daily.    Marland Kitchen warfarin (COUMADIN) 5 MG tablet Take 5 mg by mouth daily.     No current facility-administered medications for this visit.  Allergies:   Review of patient's allergies indicates no known allergies.    Social History:  The patient  reports that he has quit smoking. His smoking use included Cigarettes. He has a 33 pack-year smoking history. He has never used smokeless tobacco. He reports that he does not drink alcohol or use illicit drugs.   Family History:  The patient's family history includes Cancer in his sister; Healthy in his mother; Heart attack in his brother and father; Heart disease in his brother, brother, and father; Hypertension in his brother, brother, and brother; Stroke in his brother and brother.    ROS:   Please see the history of present illness.   Review of Systems  HENT: Positive for hearing loss.   Gastrointestinal: Positive for diarrhea.    Genitourinary: Positive for incomplete emptying.  All other systems reviewed and are negative.     PHYSICAL EXAM: VS:  BP 120/60 mmHg  Pulse 50  Ht 5\' 11"  (1.803 m)  Wt 166 lb (75.297 kg)  BMI 23.16 kg/m2  SpO2 97%    Wt Readings from Last 3 Encounters:  09/20/14 166 lb (75.297 kg)  08/09/14 172 lb 12.8 oz (78.382 kg)  03/20/14 178 lb (80.74 kg)     GEN: Well nourished, well developed, in no acute distress HEENT: normal Neck: no JVD,  no masses Cardiac:  Normal S1/S2, irregularly irregular rhythm; no murmur ,  no rubs or gallops, trace R ankle edema   Respiratory:  clear to auscultation bilaterally, no wheezing, rhonchi or rales. GI: soft, nontender, nondistended, + BS MS: no deformity or atrophy Skin: warm and dry  Neuro:  CNs II-XII intact, Strength and sensation are intact Psych: Normal affect   EKG:  EKG is not ordered today.  It demonstrates:   N/a   Recent Labs: 03/20/2014: BUN 33*; Creatinine, Ser 1.4; Potassium 5.0; Sodium 143    Lipid Panel No results found for: CHOL, TRIG, HDL, CHOLHDL, VLDL, LDLCALC, LDLDIRECT    ASSESSMENT AND PLAN:  Essential hypertension:  Blood pressure is much better controlled on his current regimen. I have recommended that he continue amlodipine 5 mg, atenolol 25 mg and Accupril 10 mg daily.   Chronic atrial fibrillation:  He apparently gets his INR checked with primary care. He tells me that he is not supposed to be driving. He is also somewhat forgetful.  He likely has dementia.  I have encouraged his daughter to discuss with his PCP to see if he would qualify for home health RN.  This may help with INR checks as well as monitoring his medications at home.  If he does not qualify for home health, may be able to consider referral to [facility].  His HR is lower (in the  50s).  Will decrease Digoxin to 0.125 mg QD.  Check Digoxin level and ECG in 1 week.   Mitral valve disorder:  Severe MR on last echo.  He is not a candidate for  MVR.  Carotid stenosis, bilateral:  Followed by [facility].  Coronary artery disease involving native coronary artery of native heart without angina pectoris:  No angina.  No ASA as he is on Coumadin.      Medication Changes: Current medicines are reviewed at length with the patient today.  Concerns regarding medicines are as outlined above.  The following changes have been made:   Discontinued Medications   COLCHICINE 0.6 MG TABLET    Take 1 tablet (0.6 mg total) by mouth daily.   Modified Medications  Modified Medication Previous Medication   DIGOXIN (LANOXIN) 0.125 MG TABLET digoxin (LANOXIN) 0.25 MG tablet      Take 1 tablet (0.125 mg total) by mouth daily.    Take 0.25 mg by mouth daily.   New Prescriptions   No medications on file    Labs/ tests ordered today include:   Orders Placed This Encounter  Procedures  . Digoxin level  . PR OFFICE OUTPATIENT VISIT 5 MINUTES     Disposition:   FU with Dr. Casandra Doffing or me in 3 mos.    Signed, Versie Starks, MHS 09/20/2014 1:26 PM    [facility] [place], [place], [place]  [address] Phone: [phone]; Fax: [phone]

## 2014-09-20 ENCOUNTER — Ambulatory Visit (INDEPENDENT_AMBULATORY_CARE_PROVIDER_SITE_OTHER): Payer: Commercial Managed Care - HMO | Admitting: Physician Assistant

## 2014-09-20 ENCOUNTER — Encounter: Payer: Self-pay | Admitting: Physician Assistant

## 2014-09-20 VITALS — BP 120/60 | HR 50 | Ht 71.0 in | Wt 166.0 lb

## 2014-09-20 DIAGNOSIS — I6523 Occlusion and stenosis of bilateral carotid arteries: Secondary | ICD-10-CM | POA: Diagnosis not present

## 2014-09-20 DIAGNOSIS — I482 Chronic atrial fibrillation, unspecified: Secondary | ICD-10-CM

## 2014-09-20 DIAGNOSIS — I2510 Atherosclerotic heart disease of native coronary artery without angina pectoris: Secondary | ICD-10-CM

## 2014-09-20 DIAGNOSIS — I10 Essential (primary) hypertension: Secondary | ICD-10-CM | POA: Diagnosis not present

## 2014-09-20 DIAGNOSIS — I059 Rheumatic mitral valve disease, unspecified: Secondary | ICD-10-CM

## 2014-09-20 MED ORDER — DIGOXIN 125 MCG PO TABS: 0.1250 mg | ORAL_TABLET | Freq: Every day | ORAL | Status: DC

## 2014-09-20 NOTE — Patient Instructions (Signed)
Medication Instructions:  1. DECREASE DIGOXIN TO 0.125 MG DAILY; A NEW RX SENT IN FOR THE 0.125 MG TABLET TO [facility]; YOU CAN CUT THE 0.25 MG TABS IN 1/2 UNTIL NEW RX ARRIVES  Labwork: 1. DIGOXIN LEVEL TO BE DONE ON 09/28/14 @ 2:30; MAKE SURE TO HOLD YOUR DIGOXIN THE DAY OF LAB WORK UNTIL AFTER YOUR LAB WORK HAS BEEN COMPLETED  2. 09/28/14 @ 2 PM FOR NURSE VISIT FOR AN EKG DUE TO DECREASE IN DIGOXIN;   Testing/Procedures: NONE  Follow-Up: 3 MONTHS WITH SCOTT WEAVER, PAC SAME DAY DR. Irish Lack IS IN THE OFFICE; WE WILL SEND OUT A REMINDER LETTER   Any Other Special Instructions Will Be Listed Below (If Applicable).

## 2014-09-28 ENCOUNTER — Ambulatory Visit (INDEPENDENT_AMBULATORY_CARE_PROVIDER_SITE_OTHER): Payer: Commercial Managed Care - HMO

## 2014-09-28 ENCOUNTER — Other Ambulatory Visit (INDEPENDENT_AMBULATORY_CARE_PROVIDER_SITE_OTHER): Payer: Commercial Managed Care - HMO | Admitting: *Deleted

## 2014-09-28 ENCOUNTER — Other Ambulatory Visit: Payer: Self-pay

## 2014-09-28 VITALS — BP 97/52 | HR 61 | Ht 71.0 in | Wt 167.0 lb

## 2014-09-28 DIAGNOSIS — I482 Chronic atrial fibrillation, unspecified: Secondary | ICD-10-CM

## 2014-09-28 DIAGNOSIS — I4891 Unspecified atrial fibrillation: Secondary | ICD-10-CM | POA: Diagnosis not present

## 2014-09-28 MED ORDER — DIGOXIN 125 MCG PO TABS: 0.1250 mg | ORAL_TABLET | Freq: Every day | ORAL | Status: DC

## 2014-09-28 NOTE — Addendum Note (Signed)
Addended by: Eulis Foster on: 09/28/2014 02:11 PM   Modules accepted: Orders

## 2014-09-28 NOTE — Progress Notes (Signed)
**Note De-Identified  Obfuscation** The pt arrives in the office for an EKG and Digoxin Level. At [place] with Richardson Dopp, PA-c on 09/20/14 the pt was advised to decrease his Digoxin dose to 0.125 mg daily due to his low HR in the low 50's. The pt reported to me that he did not take his Digoxin dose this am prior to Digoxin level draw as directed but that he has been taking his Digoxin daily. EKG obtained and given to Dr Radford Pax for review.  Per Dr Radford Pax his EKG looks good and she wants him to continue current medical treatment.  When I returned to the pts room the pts daughter stated that the pt just told her that he has not been taking Digoxin at all since his OV with Nicki Reaper and that she thinks he got confused because he was told not to take this mornings dose ONLY for lab draw. I then asked the pt if he has been taking his Digoxin and he stated that he has been taking daily except for today because he was having Digoxin level checked. The daughter asked the pt in front of me if he has been taking his Digoxin and again he stated he has been taking daily but he thinks he forgot to break them in two. The pts BP was checked with dynamap and it was 97/52 and manually his BP was 102/58 with HR of 61. The pt denies dizziness, light headedness, weakness or fatigue.  The pt and his daughter are advised that once they get back to the pts home today to cut his Digoxin tablets in two and put in a pill box so the pt will be getting the correct dose daily.  Also, they are advised that I am sending the notes from this visit to Richardson Dopp, PA-c for his review and recommendations.   They both verbalized understanding.    Marland Kitchen

## 2014-09-29 LAB — DIGOXIN LEVEL: DIGOXIN LVL: 0.6 ug/L — AB (ref 0.8–2.0)

## 2014-09-30 NOTE — Progress Notes (Signed)
Please confirm with patient and his daughter what he was doing with his medications at the time he had his BP check on 7/15. If they are not sure what he was doing, go ahead and change Digoxin to 0.125 mg QD and repeat BP check with ECG 1 week later. Check Digoxin level that day as well (hold Digoxin that AM until after blood drawn). Richardson Dopp, PA-C  09/30/2014 11:48 AM   ADDENDUM: His BP is much lower.  I need to know what he is doing with all of his medications. BP when I saw him was normal. I am concerned he is taking more that what he is supposed to be taking. Richardson Dopp, PA-C   09/30/2014 11:50 AM

## 2014-09-30 NOTE — Progress Notes (Signed)
Please confirm with patient and his daughter what he was doing with his medications at the time he had his BP check on 7/15. If they are not sure what he was doing, go ahead and change Digoxin to 0.125 mg QD and repeat BP check with ECG 1 week later. Check Digoxin level that day as well (hold Digoxin that AM until after blood drawn). Richardson Dopp, PA-C   09/30/2014 11:48 AM

## 2014-10-01 ENCOUNTER — Telehealth: Payer: Self-pay | Admitting: *Deleted

## 2014-10-01 NOTE — Progress Notes (Signed)
[name], CMA at 10/01/2014 1:41 PM     Status: Signed       Expand All Collapse All   I reviewed with pt what medications he is currently taking; This is per pt: taking Amlodipine 5 mg daily; Atenolol 25 mg daily; Digoxin 0.125 mg daily; Enalapril 40 mg daily; Flomax 0.4 mg daily; Warfarin 5 mg daily; fish oil 1000 daily. We have listed in pt's chart : Amlodipine 2.5 mg daily; Atenolol 50 mg daily; we did decrease Digoxin to 0.125 mg daily; We did not however have the Enalapril 40 mg daily; the coumadin and fish oil and flomax we had the same as the pt. I advised pt that I will d/w Nicki Reaper W. PA and cb later with any recommendations. I asked pt who gave him the Enalapril 40 mg ; he said Dr. Delfina Redwood did.

## 2014-10-01 NOTE — Telephone Encounter (Signed)
Pt notified of lab results for digoxin level ok per PA. Also per PA to verify what meds pt is taking. I reviewed med list w/pt and found some discrpencies; see documentation for the med discrepencies.

## 2014-10-01 NOTE — Telephone Encounter (Signed)
I reviewed with pt what medications he is currently taking; This is per pt: taking Amlodipine 5 mg daily; Atenolol 25 mg daily; Digoxin 0.125 mg daily; Enalapril 40 mg daily; Flomax 0.4 mg daily; Warfarin 5 mg daily; fish oil 1000 daily. We have listed in pt's chart : Amlodipine 2.5 mg daily; Atenolol 50 mg daily; we did decrease Digoxin to 0.125 mg daily; We did not however have the Enalapril 40 mg daily; the coumadin and fish oil and flomax we had the same as the pt. I advised pt that I will d/w Nicki Reaper W. PA and cb later with any recommendations. I asked pt who gave him the Enalapril 40 mg ; he said Dr. Delfina Redwood did.

## 2014-10-01 NOTE — Telephone Encounter (Signed)
Decrease Enalapril to 20 mg QD Repeat BP check with RN in 2 weeks. Have him bring all medications to confirm list as noted. Richardson Dopp, PA-C   10/01/2014 9:01 PM

## 2014-10-04 NOTE — Telephone Encounter (Signed)
I tried tcb x 2 to advise pt per Brynda Rim. PA to decrease Enalapril to 20 mg daily; I also tried to reach his daughter's phone as well, though no answer.

## 2014-10-04 NOTE — Telephone Encounter (Signed)
Lmptcb to discuss w/pt to decrease Enalapril to 20 mg per [employer]. PA.

## 2014-10-08 DIAGNOSIS — I4891 Unspecified atrial fibrillation: Secondary | ICD-10-CM | POA: Diagnosis not present

## 2014-10-08 DIAGNOSIS — E780 Pure hypercholesterolemia: Secondary | ICD-10-CM | POA: Diagnosis not present

## 2014-10-08 DIAGNOSIS — I10 Essential (primary) hypertension: Secondary | ICD-10-CM | POA: Diagnosis not present

## 2014-10-08 DIAGNOSIS — M1990 Unspecified osteoarthritis, unspecified site: Secondary | ICD-10-CM | POA: Diagnosis not present

## 2014-10-11 ENCOUNTER — Encounter: Payer: Self-pay | Admitting: *Deleted

## 2014-10-15 NOTE — Telephone Encounter (Signed)
I have tried a couple of times to reach pt to go over recommendation from [place]. PA to decrease Enalapril 20 mg, see previous phone note. I sent a letter out to pt to decrease Enalapril to 20 mg.

## 2014-10-18 DIAGNOSIS — Z7901 Long term (current) use of anticoagulants: Secondary | ICD-10-CM | POA: Diagnosis not present

## 2014-10-23 DIAGNOSIS — N183 Chronic kidney disease, stage 3 (moderate): Secondary | ICD-10-CM | POA: Diagnosis not present

## 2014-10-23 DIAGNOSIS — I429 Cardiomyopathy, unspecified: Secondary | ICD-10-CM | POA: Diagnosis not present

## 2014-10-23 DIAGNOSIS — Z5181 Encounter for therapeutic drug level monitoring: Secondary | ICD-10-CM | POA: Diagnosis not present

## 2014-10-23 DIAGNOSIS — M6281 Muscle weakness (generalized): Secondary | ICD-10-CM | POA: Diagnosis not present

## 2014-10-23 DIAGNOSIS — I4891 Unspecified atrial fibrillation: Secondary | ICD-10-CM | POA: Diagnosis not present

## 2014-10-23 DIAGNOSIS — M1990 Unspecified osteoarthritis, unspecified site: Secondary | ICD-10-CM | POA: Diagnosis not present

## 2014-10-23 DIAGNOSIS — C61 Malignant neoplasm of prostate: Secondary | ICD-10-CM | POA: Diagnosis not present

## 2014-10-23 DIAGNOSIS — I129 Hypertensive chronic kidney disease with stage 1 through stage 4 chronic kidney disease, or unspecified chronic kidney disease: Secondary | ICD-10-CM | POA: Diagnosis not present

## 2014-10-23 DIAGNOSIS — M25551 Pain in right hip: Secondary | ICD-10-CM | POA: Diagnosis not present

## 2014-10-25 DIAGNOSIS — I129 Hypertensive chronic kidney disease with stage 1 through stage 4 chronic kidney disease, or unspecified chronic kidney disease: Secondary | ICD-10-CM | POA: Diagnosis not present

## 2014-10-25 DIAGNOSIS — Z5181 Encounter for therapeutic drug level monitoring: Secondary | ICD-10-CM | POA: Diagnosis not present

## 2014-10-25 DIAGNOSIS — M25551 Pain in right hip: Secondary | ICD-10-CM | POA: Diagnosis not present

## 2014-10-25 DIAGNOSIS — I429 Cardiomyopathy, unspecified: Secondary | ICD-10-CM | POA: Diagnosis not present

## 2014-10-25 DIAGNOSIS — C61 Malignant neoplasm of prostate: Secondary | ICD-10-CM | POA: Diagnosis not present

## 2014-10-25 DIAGNOSIS — N183 Chronic kidney disease, stage 3 (moderate): Secondary | ICD-10-CM | POA: Diagnosis not present

## 2014-10-25 DIAGNOSIS — I4891 Unspecified atrial fibrillation: Secondary | ICD-10-CM | POA: Diagnosis not present

## 2014-10-25 DIAGNOSIS — M1990 Unspecified osteoarthritis, unspecified site: Secondary | ICD-10-CM | POA: Diagnosis not present

## 2014-10-25 DIAGNOSIS — M6281 Muscle weakness (generalized): Secondary | ICD-10-CM | POA: Diagnosis not present

## 2014-10-29 ENCOUNTER — Telehealth: Payer: Self-pay | Admitting: Interventional Cardiology

## 2014-10-29 NOTE — Telephone Encounter (Signed)
Spoke with pt's daughter and she states that normally Dr. Lina Sar office controls pt's Coumadin but for the last two weeks it has been low and when the Home Health nurse calls their office, they refuse to make any changes to the pt's medication. Home Health nurse asked daughter to contact our office to see if pt could be seen in our Coumadin Clinic. Scheduled pt for 10/30/14 for new coumadin appt. Daughter verbalized understanding and was appreciative for the call.

## 2014-10-29 NOTE — Telephone Encounter (Signed)
New message      Daughter states pts INR is 1.2.  Home health says his coumadin needs to be adjusted.  Patient's PCP---Dr Polite, currently sees pt for his coumadin.  Family want to know if pt can start coming here to be seen in the coumadin clinic.  Please call

## 2014-10-30 ENCOUNTER — Ambulatory Visit (INDEPENDENT_AMBULATORY_CARE_PROVIDER_SITE_OTHER): Payer: Commercial Managed Care - HMO | Admitting: *Deleted

## 2014-10-30 DIAGNOSIS — I482 Chronic atrial fibrillation, unspecified: Secondary | ICD-10-CM

## 2014-10-30 DIAGNOSIS — Z5181 Encounter for therapeutic drug level monitoring: Secondary | ICD-10-CM | POA: Diagnosis not present

## 2014-10-30 LAB — POCT INR: INR: 1.2

## 2014-10-30 NOTE — Telephone Encounter (Signed)
I recv'd a message from Elbert Ewings, RN today from our [facility] clinic about pt's medications. I sent Ivin Booty a staff message today about pt's medications.

## 2014-10-30 NOTE — Patient Instructions (Signed)

## 2014-10-31 DIAGNOSIS — M6281 Muscle weakness (generalized): Secondary | ICD-10-CM | POA: Diagnosis not present

## 2014-10-31 DIAGNOSIS — M1990 Unspecified osteoarthritis, unspecified site: Secondary | ICD-10-CM | POA: Diagnosis not present

## 2014-10-31 DIAGNOSIS — N183 Chronic kidney disease, stage 3 (moderate): Secondary | ICD-10-CM | POA: Diagnosis not present

## 2014-10-31 DIAGNOSIS — C61 Malignant neoplasm of prostate: Secondary | ICD-10-CM | POA: Diagnosis not present

## 2014-10-31 DIAGNOSIS — Z5181 Encounter for therapeutic drug level monitoring: Secondary | ICD-10-CM | POA: Diagnosis not present

## 2014-10-31 DIAGNOSIS — I4891 Unspecified atrial fibrillation: Secondary | ICD-10-CM | POA: Diagnosis not present

## 2014-10-31 DIAGNOSIS — M25551 Pain in right hip: Secondary | ICD-10-CM | POA: Diagnosis not present

## 2014-10-31 DIAGNOSIS — I429 Cardiomyopathy, unspecified: Secondary | ICD-10-CM | POA: Diagnosis not present

## 2014-10-31 DIAGNOSIS — I129 Hypertensive chronic kidney disease with stage 1 through stage 4 chronic kidney disease, or unspecified chronic kidney disease: Secondary | ICD-10-CM | POA: Diagnosis not present

## 2014-11-02 DIAGNOSIS — M1990 Unspecified osteoarthritis, unspecified site: Secondary | ICD-10-CM | POA: Diagnosis not present

## 2014-11-02 DIAGNOSIS — I129 Hypertensive chronic kidney disease with stage 1 through stage 4 chronic kidney disease, or unspecified chronic kidney disease: Secondary | ICD-10-CM | POA: Diagnosis not present

## 2014-11-02 DIAGNOSIS — M6281 Muscle weakness (generalized): Secondary | ICD-10-CM | POA: Diagnosis not present

## 2014-11-02 DIAGNOSIS — I429 Cardiomyopathy, unspecified: Secondary | ICD-10-CM | POA: Diagnosis not present

## 2014-11-02 DIAGNOSIS — I4891 Unspecified atrial fibrillation: Secondary | ICD-10-CM | POA: Diagnosis not present

## 2014-11-02 DIAGNOSIS — C61 Malignant neoplasm of prostate: Secondary | ICD-10-CM | POA: Diagnosis not present

## 2014-11-02 DIAGNOSIS — M25551 Pain in right hip: Secondary | ICD-10-CM | POA: Diagnosis not present

## 2014-11-02 DIAGNOSIS — N183 Chronic kidney disease, stage 3 (moderate): Secondary | ICD-10-CM | POA: Diagnosis not present

## 2014-11-02 DIAGNOSIS — Z5181 Encounter for therapeutic drug level monitoring: Secondary | ICD-10-CM | POA: Diagnosis not present

## 2014-11-03 DIAGNOSIS — I429 Cardiomyopathy, unspecified: Secondary | ICD-10-CM | POA: Diagnosis not present

## 2014-11-03 DIAGNOSIS — I4891 Unspecified atrial fibrillation: Secondary | ICD-10-CM | POA: Diagnosis not present

## 2014-11-03 DIAGNOSIS — N183 Chronic kidney disease, stage 3 (moderate): Secondary | ICD-10-CM | POA: Diagnosis not present

## 2014-11-03 DIAGNOSIS — M6281 Muscle weakness (generalized): Secondary | ICD-10-CM | POA: Diagnosis not present

## 2014-11-03 DIAGNOSIS — M1990 Unspecified osteoarthritis, unspecified site: Secondary | ICD-10-CM | POA: Diagnosis not present

## 2014-11-03 DIAGNOSIS — I129 Hypertensive chronic kidney disease with stage 1 through stage 4 chronic kidney disease, or unspecified chronic kidney disease: Secondary | ICD-10-CM | POA: Diagnosis not present

## 2014-11-03 DIAGNOSIS — Z5181 Encounter for therapeutic drug level monitoring: Secondary | ICD-10-CM | POA: Diagnosis not present

## 2014-11-03 DIAGNOSIS — M25551 Pain in right hip: Secondary | ICD-10-CM | POA: Diagnosis not present

## 2014-11-03 DIAGNOSIS — C61 Malignant neoplasm of prostate: Secondary | ICD-10-CM | POA: Diagnosis not present

## 2014-11-05 DIAGNOSIS — Z5181 Encounter for therapeutic drug level monitoring: Secondary | ICD-10-CM | POA: Diagnosis not present

## 2014-11-05 DIAGNOSIS — I429 Cardiomyopathy, unspecified: Secondary | ICD-10-CM | POA: Diagnosis not present

## 2014-11-05 DIAGNOSIS — I129 Hypertensive chronic kidney disease with stage 1 through stage 4 chronic kidney disease, or unspecified chronic kidney disease: Secondary | ICD-10-CM | POA: Diagnosis not present

## 2014-11-05 DIAGNOSIS — I4891 Unspecified atrial fibrillation: Secondary | ICD-10-CM | POA: Diagnosis not present

## 2014-11-05 DIAGNOSIS — M1990 Unspecified osteoarthritis, unspecified site: Secondary | ICD-10-CM | POA: Diagnosis not present

## 2014-11-05 DIAGNOSIS — M25551 Pain in right hip: Secondary | ICD-10-CM | POA: Diagnosis not present

## 2014-11-05 DIAGNOSIS — C61 Malignant neoplasm of prostate: Secondary | ICD-10-CM | POA: Diagnosis not present

## 2014-11-05 DIAGNOSIS — N183 Chronic kidney disease, stage 3 (moderate): Secondary | ICD-10-CM | POA: Diagnosis not present

## 2014-11-05 DIAGNOSIS — M6281 Muscle weakness (generalized): Secondary | ICD-10-CM | POA: Diagnosis not present

## 2014-11-06 ENCOUNTER — Telehealth: Payer: Self-pay | Admitting: Interventional Cardiology

## 2014-11-06 ENCOUNTER — Ambulatory Visit (INDEPENDENT_AMBULATORY_CARE_PROVIDER_SITE_OTHER): Payer: Commercial Managed Care - HMO | Admitting: Cardiology

## 2014-11-06 DIAGNOSIS — M25551 Pain in right hip: Secondary | ICD-10-CM | POA: Diagnosis not present

## 2014-11-06 DIAGNOSIS — I482 Chronic atrial fibrillation, unspecified: Secondary | ICD-10-CM

## 2014-11-06 DIAGNOSIS — C61 Malignant neoplasm of prostate: Secondary | ICD-10-CM | POA: Diagnosis not present

## 2014-11-06 DIAGNOSIS — I129 Hypertensive chronic kidney disease with stage 1 through stage 4 chronic kidney disease, or unspecified chronic kidney disease: Secondary | ICD-10-CM | POA: Diagnosis not present

## 2014-11-06 DIAGNOSIS — M6281 Muscle weakness (generalized): Secondary | ICD-10-CM | POA: Diagnosis not present

## 2014-11-06 DIAGNOSIS — I4891 Unspecified atrial fibrillation: Secondary | ICD-10-CM | POA: Diagnosis not present

## 2014-11-06 DIAGNOSIS — N183 Chronic kidney disease, stage 3 (moderate): Secondary | ICD-10-CM | POA: Diagnosis not present

## 2014-11-06 DIAGNOSIS — M1990 Unspecified osteoarthritis, unspecified site: Secondary | ICD-10-CM | POA: Diagnosis not present

## 2014-11-06 DIAGNOSIS — Z5181 Encounter for therapeutic drug level monitoring: Secondary | ICD-10-CM

## 2014-11-06 DIAGNOSIS — I429 Cardiomyopathy, unspecified: Secondary | ICD-10-CM | POA: Diagnosis not present

## 2014-11-06 LAB — POCT INR: INR: 1.7

## 2014-11-06 NOTE — Telephone Encounter (Signed)
New message    Nurse states: Patient INR today is 1.7 Current dosage is 5mg  Monday, Wednesday, Friday and Sunday Dosage for Tuesday, Thursday and Saturday is 7.5 mg  Please call about next dosage and when INR needs to be done again

## 2014-11-06 NOTE — Telephone Encounter (Signed)
Please see coumadin encounter for this date 

## 2014-11-08 DIAGNOSIS — N183 Chronic kidney disease, stage 3 (moderate): Secondary | ICD-10-CM | POA: Diagnosis not present

## 2014-11-08 DIAGNOSIS — M25551 Pain in right hip: Secondary | ICD-10-CM | POA: Diagnosis not present

## 2014-11-08 DIAGNOSIS — I4891 Unspecified atrial fibrillation: Secondary | ICD-10-CM | POA: Diagnosis not present

## 2014-11-08 DIAGNOSIS — M6281 Muscle weakness (generalized): Secondary | ICD-10-CM | POA: Diagnosis not present

## 2014-11-08 DIAGNOSIS — Z5181 Encounter for therapeutic drug level monitoring: Secondary | ICD-10-CM | POA: Diagnosis not present

## 2014-11-08 DIAGNOSIS — M1990 Unspecified osteoarthritis, unspecified site: Secondary | ICD-10-CM | POA: Diagnosis not present

## 2014-11-08 DIAGNOSIS — I129 Hypertensive chronic kidney disease with stage 1 through stage 4 chronic kidney disease, or unspecified chronic kidney disease: Secondary | ICD-10-CM | POA: Diagnosis not present

## 2014-11-08 DIAGNOSIS — I429 Cardiomyopathy, unspecified: Secondary | ICD-10-CM | POA: Diagnosis not present

## 2014-11-08 DIAGNOSIS — C61 Malignant neoplasm of prostate: Secondary | ICD-10-CM | POA: Diagnosis not present

## 2014-11-09 ENCOUNTER — Telehealth: Payer: Self-pay | Admitting: Interventional Cardiology

## 2014-11-09 NOTE — Telephone Encounter (Signed)
Information sent over on 10/31/14 from [place] in regards to pt continuing to receive [place] since the pt drove himself to pick up his hearing aids. Spoke with Beth at that time and she stated that they would need a statement in regards to pt still needing to receive [place] because his insurance was wanting to stop [facility] visits since the pt drove. Spoke with Dr. Irish Lack today and he said this information should come from pt's PCP. Spoke with Beth at [facility] and she said they would contact pt's PCP office but that pt's services would be d/c'ed soon due to insurance.

## 2014-11-14 DIAGNOSIS — I4891 Unspecified atrial fibrillation: Secondary | ICD-10-CM | POA: Diagnosis not present

## 2014-11-14 DIAGNOSIS — M25551 Pain in right hip: Secondary | ICD-10-CM | POA: Diagnosis not present

## 2014-11-14 DIAGNOSIS — M1990 Unspecified osteoarthritis, unspecified site: Secondary | ICD-10-CM | POA: Diagnosis not present

## 2014-11-14 DIAGNOSIS — I129 Hypertensive chronic kidney disease with stage 1 through stage 4 chronic kidney disease, or unspecified chronic kidney disease: Secondary | ICD-10-CM | POA: Diagnosis not present

## 2014-11-14 DIAGNOSIS — M6281 Muscle weakness (generalized): Secondary | ICD-10-CM | POA: Diagnosis not present

## 2014-11-14 DIAGNOSIS — C61 Malignant neoplasm of prostate: Secondary | ICD-10-CM | POA: Diagnosis not present

## 2014-11-14 DIAGNOSIS — Z5181 Encounter for therapeutic drug level monitoring: Secondary | ICD-10-CM | POA: Diagnosis not present

## 2014-11-14 DIAGNOSIS — I429 Cardiomyopathy, unspecified: Secondary | ICD-10-CM | POA: Diagnosis not present

## 2014-11-14 DIAGNOSIS — N183 Chronic kidney disease, stage 3 (moderate): Secondary | ICD-10-CM | POA: Diagnosis not present

## 2014-11-15 ENCOUNTER — Ambulatory Visit (INDEPENDENT_AMBULATORY_CARE_PROVIDER_SITE_OTHER): Payer: Commercial Managed Care - HMO | Admitting: Pharmacist

## 2014-11-15 DIAGNOSIS — M25551 Pain in right hip: Secondary | ICD-10-CM | POA: Diagnosis not present

## 2014-11-15 DIAGNOSIS — I482 Chronic atrial fibrillation, unspecified: Secondary | ICD-10-CM

## 2014-11-15 DIAGNOSIS — M1990 Unspecified osteoarthritis, unspecified site: Secondary | ICD-10-CM | POA: Diagnosis not present

## 2014-11-15 DIAGNOSIS — Z5181 Encounter for therapeutic drug level monitoring: Secondary | ICD-10-CM

## 2014-11-15 DIAGNOSIS — N183 Chronic kidney disease, stage 3 (moderate): Secondary | ICD-10-CM | POA: Diagnosis not present

## 2014-11-15 DIAGNOSIS — C61 Malignant neoplasm of prostate: Secondary | ICD-10-CM | POA: Diagnosis not present

## 2014-11-15 DIAGNOSIS — M6281 Muscle weakness (generalized): Secondary | ICD-10-CM | POA: Diagnosis not present

## 2014-11-15 DIAGNOSIS — I4891 Unspecified atrial fibrillation: Secondary | ICD-10-CM | POA: Diagnosis not present

## 2014-11-15 DIAGNOSIS — I129 Hypertensive chronic kidney disease with stage 1 through stage 4 chronic kidney disease, or unspecified chronic kidney disease: Secondary | ICD-10-CM | POA: Diagnosis not present

## 2014-11-15 DIAGNOSIS — I429 Cardiomyopathy, unspecified: Secondary | ICD-10-CM | POA: Diagnosis not present

## 2014-11-15 LAB — POCT INR: INR: 1.3

## 2014-11-26 ENCOUNTER — Ambulatory Visit (INDEPENDENT_AMBULATORY_CARE_PROVIDER_SITE_OTHER): Payer: Commercial Managed Care - HMO | Admitting: *Deleted

## 2014-11-26 DIAGNOSIS — Z5181 Encounter for therapeutic drug level monitoring: Secondary | ICD-10-CM

## 2014-11-26 DIAGNOSIS — I482 Chronic atrial fibrillation, unspecified: Secondary | ICD-10-CM

## 2014-11-26 LAB — POCT INR: INR: 2.3

## 2014-12-11 ENCOUNTER — Ambulatory Visit (INDEPENDENT_AMBULATORY_CARE_PROVIDER_SITE_OTHER): Payer: Commercial Managed Care - HMO | Admitting: Cardiovascular Disease

## 2014-12-11 DIAGNOSIS — I482 Chronic atrial fibrillation, unspecified: Secondary | ICD-10-CM

## 2014-12-11 DIAGNOSIS — Z5181 Encounter for therapeutic drug level monitoring: Secondary | ICD-10-CM

## 2014-12-11 DIAGNOSIS — N183 Chronic kidney disease, stage 3 (moderate): Secondary | ICD-10-CM | POA: Diagnosis not present

## 2014-12-11 DIAGNOSIS — Z87891 Personal history of nicotine dependence: Secondary | ICD-10-CM | POA: Diagnosis not present

## 2014-12-11 DIAGNOSIS — R2689 Other abnormalities of gait and mobility: Secondary | ICD-10-CM | POA: Diagnosis not present

## 2014-12-11 DIAGNOSIS — M25551 Pain in right hip: Secondary | ICD-10-CM | POA: Diagnosis not present

## 2014-12-11 DIAGNOSIS — Z96641 Presence of right artificial hip joint: Secondary | ICD-10-CM | POA: Diagnosis not present

## 2014-12-11 DIAGNOSIS — I429 Cardiomyopathy, unspecified: Secondary | ICD-10-CM | POA: Diagnosis not present

## 2014-12-11 DIAGNOSIS — Z7901 Long term (current) use of anticoagulants: Secondary | ICD-10-CM | POA: Diagnosis not present

## 2014-12-11 DIAGNOSIS — I4891 Unspecified atrial fibrillation: Secondary | ICD-10-CM | POA: Diagnosis not present

## 2014-12-11 LAB — POCT INR: INR: 2.8

## 2014-12-13 DIAGNOSIS — Z7901 Long term (current) use of anticoagulants: Secondary | ICD-10-CM | POA: Diagnosis not present

## 2014-12-13 DIAGNOSIS — M25551 Pain in right hip: Secondary | ICD-10-CM | POA: Diagnosis not present

## 2014-12-13 DIAGNOSIS — N183 Chronic kidney disease, stage 3 (moderate): Secondary | ICD-10-CM | POA: Diagnosis not present

## 2014-12-13 DIAGNOSIS — R2689 Other abnormalities of gait and mobility: Secondary | ICD-10-CM | POA: Diagnosis not present

## 2014-12-13 DIAGNOSIS — Z87891 Personal history of nicotine dependence: Secondary | ICD-10-CM | POA: Diagnosis not present

## 2014-12-13 DIAGNOSIS — I429 Cardiomyopathy, unspecified: Secondary | ICD-10-CM | POA: Diagnosis not present

## 2014-12-13 DIAGNOSIS — I4891 Unspecified atrial fibrillation: Secondary | ICD-10-CM | POA: Diagnosis not present

## 2014-12-13 DIAGNOSIS — Z96641 Presence of right artificial hip joint: Secondary | ICD-10-CM | POA: Diagnosis not present

## 2014-12-14 DIAGNOSIS — Z96641 Presence of right artificial hip joint: Secondary | ICD-10-CM | POA: Diagnosis not present

## 2014-12-14 DIAGNOSIS — Z7901 Long term (current) use of anticoagulants: Secondary | ICD-10-CM | POA: Diagnosis not present

## 2014-12-14 DIAGNOSIS — N183 Chronic kidney disease, stage 3 (moderate): Secondary | ICD-10-CM | POA: Diagnosis not present

## 2014-12-14 DIAGNOSIS — R2689 Other abnormalities of gait and mobility: Secondary | ICD-10-CM | POA: Diagnosis not present

## 2014-12-14 DIAGNOSIS — I4891 Unspecified atrial fibrillation: Secondary | ICD-10-CM | POA: Diagnosis not present

## 2014-12-14 DIAGNOSIS — Z87891 Personal history of nicotine dependence: Secondary | ICD-10-CM | POA: Diagnosis not present

## 2014-12-14 DIAGNOSIS — I429 Cardiomyopathy, unspecified: Secondary | ICD-10-CM | POA: Diagnosis not present

## 2014-12-14 DIAGNOSIS — M25551 Pain in right hip: Secondary | ICD-10-CM | POA: Diagnosis not present

## 2014-12-19 DIAGNOSIS — I429 Cardiomyopathy, unspecified: Secondary | ICD-10-CM | POA: Diagnosis not present

## 2014-12-19 DIAGNOSIS — I4891 Unspecified atrial fibrillation: Secondary | ICD-10-CM | POA: Diagnosis not present

## 2014-12-19 DIAGNOSIS — Z7901 Long term (current) use of anticoagulants: Secondary | ICD-10-CM | POA: Diagnosis not present

## 2014-12-19 DIAGNOSIS — N183 Chronic kidney disease, stage 3 (moderate): Secondary | ICD-10-CM | POA: Diagnosis not present

## 2014-12-19 DIAGNOSIS — M25551 Pain in right hip: Secondary | ICD-10-CM | POA: Diagnosis not present

## 2014-12-19 DIAGNOSIS — R2689 Other abnormalities of gait and mobility: Secondary | ICD-10-CM | POA: Diagnosis not present

## 2014-12-19 DIAGNOSIS — Z87891 Personal history of nicotine dependence: Secondary | ICD-10-CM | POA: Diagnosis not present

## 2014-12-19 DIAGNOSIS — Z96641 Presence of right artificial hip joint: Secondary | ICD-10-CM | POA: Diagnosis not present

## 2014-12-21 DIAGNOSIS — M25551 Pain in right hip: Secondary | ICD-10-CM | POA: Diagnosis not present

## 2014-12-21 DIAGNOSIS — I4891 Unspecified atrial fibrillation: Secondary | ICD-10-CM | POA: Diagnosis not present

## 2014-12-21 DIAGNOSIS — R2689 Other abnormalities of gait and mobility: Secondary | ICD-10-CM | POA: Diagnosis not present

## 2014-12-21 DIAGNOSIS — N183 Chronic kidney disease, stage 3 (moderate): Secondary | ICD-10-CM | POA: Diagnosis not present

## 2014-12-21 DIAGNOSIS — Z87891 Personal history of nicotine dependence: Secondary | ICD-10-CM | POA: Diagnosis not present

## 2014-12-21 DIAGNOSIS — I429 Cardiomyopathy, unspecified: Secondary | ICD-10-CM | POA: Diagnosis not present

## 2014-12-21 DIAGNOSIS — Z96641 Presence of right artificial hip joint: Secondary | ICD-10-CM | POA: Diagnosis not present

## 2014-12-21 DIAGNOSIS — Z7901 Long term (current) use of anticoagulants: Secondary | ICD-10-CM | POA: Diagnosis not present

## 2014-12-25 ENCOUNTER — Ambulatory Visit (INDEPENDENT_AMBULATORY_CARE_PROVIDER_SITE_OTHER): Payer: Commercial Managed Care - HMO | Admitting: Cardiovascular Disease

## 2014-12-25 DIAGNOSIS — M25551 Pain in right hip: Secondary | ICD-10-CM | POA: Diagnosis not present

## 2014-12-25 DIAGNOSIS — I482 Chronic atrial fibrillation, unspecified: Secondary | ICD-10-CM

## 2014-12-25 DIAGNOSIS — Z5181 Encounter for therapeutic drug level monitoring: Secondary | ICD-10-CM

## 2014-12-25 DIAGNOSIS — I4891 Unspecified atrial fibrillation: Secondary | ICD-10-CM | POA: Diagnosis not present

## 2014-12-25 DIAGNOSIS — Z96641 Presence of right artificial hip joint: Secondary | ICD-10-CM | POA: Diagnosis not present

## 2014-12-25 DIAGNOSIS — R2689 Other abnormalities of gait and mobility: Secondary | ICD-10-CM | POA: Diagnosis not present

## 2014-12-25 DIAGNOSIS — I429 Cardiomyopathy, unspecified: Secondary | ICD-10-CM | POA: Diagnosis not present

## 2014-12-25 DIAGNOSIS — Z87891 Personal history of nicotine dependence: Secondary | ICD-10-CM | POA: Diagnosis not present

## 2014-12-25 DIAGNOSIS — Z7901 Long term (current) use of anticoagulants: Secondary | ICD-10-CM | POA: Diagnosis not present

## 2014-12-25 DIAGNOSIS — N183 Chronic kidney disease, stage 3 (moderate): Secondary | ICD-10-CM | POA: Diagnosis not present

## 2014-12-25 LAB — POCT INR: INR: 2.1

## 2014-12-26 DIAGNOSIS — I4891 Unspecified atrial fibrillation: Secondary | ICD-10-CM | POA: Diagnosis not present

## 2014-12-26 DIAGNOSIS — I429 Cardiomyopathy, unspecified: Secondary | ICD-10-CM | POA: Diagnosis not present

## 2014-12-26 DIAGNOSIS — Z7901 Long term (current) use of anticoagulants: Secondary | ICD-10-CM | POA: Diagnosis not present

## 2014-12-26 DIAGNOSIS — Z87891 Personal history of nicotine dependence: Secondary | ICD-10-CM | POA: Diagnosis not present

## 2014-12-26 DIAGNOSIS — Z96641 Presence of right artificial hip joint: Secondary | ICD-10-CM | POA: Diagnosis not present

## 2014-12-26 DIAGNOSIS — M25551 Pain in right hip: Secondary | ICD-10-CM | POA: Diagnosis not present

## 2014-12-26 DIAGNOSIS — R2689 Other abnormalities of gait and mobility: Secondary | ICD-10-CM | POA: Diagnosis not present

## 2014-12-26 DIAGNOSIS — N183 Chronic kidney disease, stage 3 (moderate): Secondary | ICD-10-CM | POA: Diagnosis not present

## 2014-12-28 DIAGNOSIS — I4891 Unspecified atrial fibrillation: Secondary | ICD-10-CM | POA: Diagnosis not present

## 2014-12-28 DIAGNOSIS — Z96641 Presence of right artificial hip joint: Secondary | ICD-10-CM | POA: Diagnosis not present

## 2014-12-28 DIAGNOSIS — Z7901 Long term (current) use of anticoagulants: Secondary | ICD-10-CM | POA: Diagnosis not present

## 2014-12-28 DIAGNOSIS — Z87891 Personal history of nicotine dependence: Secondary | ICD-10-CM | POA: Diagnosis not present

## 2014-12-28 DIAGNOSIS — M25551 Pain in right hip: Secondary | ICD-10-CM | POA: Diagnosis not present

## 2014-12-28 DIAGNOSIS — R2689 Other abnormalities of gait and mobility: Secondary | ICD-10-CM | POA: Diagnosis not present

## 2014-12-28 DIAGNOSIS — I429 Cardiomyopathy, unspecified: Secondary | ICD-10-CM | POA: Diagnosis not present

## 2014-12-28 DIAGNOSIS — N183 Chronic kidney disease, stage 3 (moderate): Secondary | ICD-10-CM | POA: Diagnosis not present

## 2015-01-02 DIAGNOSIS — N183 Chronic kidney disease, stage 3 (moderate): Secondary | ICD-10-CM | POA: Diagnosis not present

## 2015-01-02 DIAGNOSIS — Z7901 Long term (current) use of anticoagulants: Secondary | ICD-10-CM | POA: Diagnosis not present

## 2015-01-02 DIAGNOSIS — Z87891 Personal history of nicotine dependence: Secondary | ICD-10-CM | POA: Diagnosis not present

## 2015-01-02 DIAGNOSIS — Z96641 Presence of right artificial hip joint: Secondary | ICD-10-CM | POA: Diagnosis not present

## 2015-01-02 DIAGNOSIS — I4891 Unspecified atrial fibrillation: Secondary | ICD-10-CM | POA: Diagnosis not present

## 2015-01-02 DIAGNOSIS — R2689 Other abnormalities of gait and mobility: Secondary | ICD-10-CM | POA: Diagnosis not present

## 2015-01-02 DIAGNOSIS — M25551 Pain in right hip: Secondary | ICD-10-CM | POA: Diagnosis not present

## 2015-01-02 DIAGNOSIS — I429 Cardiomyopathy, unspecified: Secondary | ICD-10-CM | POA: Diagnosis not present

## 2015-01-04 DIAGNOSIS — R2689 Other abnormalities of gait and mobility: Secondary | ICD-10-CM | POA: Diagnosis not present

## 2015-01-04 DIAGNOSIS — I429 Cardiomyopathy, unspecified: Secondary | ICD-10-CM | POA: Diagnosis not present

## 2015-01-04 DIAGNOSIS — M25551 Pain in right hip: Secondary | ICD-10-CM | POA: Diagnosis not present

## 2015-01-04 DIAGNOSIS — N183 Chronic kidney disease, stage 3 (moderate): Secondary | ICD-10-CM | POA: Diagnosis not present

## 2015-01-04 DIAGNOSIS — Z7901 Long term (current) use of anticoagulants: Secondary | ICD-10-CM | POA: Diagnosis not present

## 2015-01-04 DIAGNOSIS — I4891 Unspecified atrial fibrillation: Secondary | ICD-10-CM | POA: Diagnosis not present

## 2015-01-04 DIAGNOSIS — Z87891 Personal history of nicotine dependence: Secondary | ICD-10-CM | POA: Diagnosis not present

## 2015-01-04 DIAGNOSIS — Z96641 Presence of right artificial hip joint: Secondary | ICD-10-CM | POA: Diagnosis not present

## 2015-01-08 DIAGNOSIS — Z96641 Presence of right artificial hip joint: Secondary | ICD-10-CM | POA: Diagnosis not present

## 2015-01-08 DIAGNOSIS — N183 Chronic kidney disease, stage 3 (moderate): Secondary | ICD-10-CM | POA: Diagnosis not present

## 2015-01-08 DIAGNOSIS — I429 Cardiomyopathy, unspecified: Secondary | ICD-10-CM | POA: Diagnosis not present

## 2015-01-08 DIAGNOSIS — I4891 Unspecified atrial fibrillation: Secondary | ICD-10-CM | POA: Diagnosis not present

## 2015-01-08 DIAGNOSIS — Z87891 Personal history of nicotine dependence: Secondary | ICD-10-CM | POA: Diagnosis not present

## 2015-01-08 DIAGNOSIS — Z7901 Long term (current) use of anticoagulants: Secondary | ICD-10-CM | POA: Diagnosis not present

## 2015-01-08 DIAGNOSIS — R2689 Other abnormalities of gait and mobility: Secondary | ICD-10-CM | POA: Diagnosis not present

## 2015-01-08 DIAGNOSIS — M25551 Pain in right hip: Secondary | ICD-10-CM | POA: Diagnosis not present

## 2015-01-10 DIAGNOSIS — M25551 Pain in right hip: Secondary | ICD-10-CM | POA: Diagnosis not present

## 2015-01-10 DIAGNOSIS — Z7901 Long term (current) use of anticoagulants: Secondary | ICD-10-CM | POA: Diagnosis not present

## 2015-01-10 DIAGNOSIS — Z96641 Presence of right artificial hip joint: Secondary | ICD-10-CM | POA: Diagnosis not present

## 2015-01-10 DIAGNOSIS — R2689 Other abnormalities of gait and mobility: Secondary | ICD-10-CM | POA: Diagnosis not present

## 2015-01-10 DIAGNOSIS — Z87891 Personal history of nicotine dependence: Secondary | ICD-10-CM | POA: Diagnosis not present

## 2015-01-10 DIAGNOSIS — I4891 Unspecified atrial fibrillation: Secondary | ICD-10-CM | POA: Diagnosis not present

## 2015-01-10 DIAGNOSIS — I429 Cardiomyopathy, unspecified: Secondary | ICD-10-CM | POA: Diagnosis not present

## 2015-01-10 DIAGNOSIS — N183 Chronic kidney disease, stage 3 (moderate): Secondary | ICD-10-CM | POA: Diagnosis not present

## 2015-01-15 DIAGNOSIS — I4891 Unspecified atrial fibrillation: Secondary | ICD-10-CM | POA: Diagnosis not present

## 2015-01-15 DIAGNOSIS — I429 Cardiomyopathy, unspecified: Secondary | ICD-10-CM | POA: Diagnosis not present

## 2015-01-15 DIAGNOSIS — Z87891 Personal history of nicotine dependence: Secondary | ICD-10-CM | POA: Diagnosis not present

## 2015-01-15 DIAGNOSIS — Z96641 Presence of right artificial hip joint: Secondary | ICD-10-CM | POA: Diagnosis not present

## 2015-01-15 DIAGNOSIS — R2689 Other abnormalities of gait and mobility: Secondary | ICD-10-CM | POA: Diagnosis not present

## 2015-01-15 DIAGNOSIS — Z7901 Long term (current) use of anticoagulants: Secondary | ICD-10-CM | POA: Diagnosis not present

## 2015-01-15 DIAGNOSIS — M25551 Pain in right hip: Secondary | ICD-10-CM | POA: Diagnosis not present

## 2015-01-15 DIAGNOSIS — N183 Chronic kidney disease, stage 3 (moderate): Secondary | ICD-10-CM | POA: Diagnosis not present

## 2015-01-15 LAB — PROTIME-INR: INR: 1.3 — AB (ref 0.9–1.1)

## 2015-01-16 ENCOUNTER — Ambulatory Visit (INDEPENDENT_AMBULATORY_CARE_PROVIDER_SITE_OTHER): Payer: Commercial Managed Care - HMO | Admitting: Internal Medicine

## 2015-01-16 DIAGNOSIS — Z5181 Encounter for therapeutic drug level monitoring: Secondary | ICD-10-CM

## 2015-01-16 DIAGNOSIS — I482 Chronic atrial fibrillation, unspecified: Secondary | ICD-10-CM

## 2015-01-22 DIAGNOSIS — I059 Rheumatic mitral valve disease, unspecified: Secondary | ICD-10-CM | POA: Diagnosis not present

## 2015-01-22 DIAGNOSIS — I4891 Unspecified atrial fibrillation: Secondary | ICD-10-CM | POA: Diagnosis not present

## 2015-01-22 DIAGNOSIS — I10 Essential (primary) hypertension: Secondary | ICD-10-CM | POA: Diagnosis not present

## 2015-01-22 DIAGNOSIS — E7800 Pure hypercholesterolemia, unspecified: Secondary | ICD-10-CM | POA: Diagnosis not present

## 2015-01-22 DIAGNOSIS — Z1389 Encounter for screening for other disorder: Secondary | ICD-10-CM | POA: Diagnosis not present

## 2015-01-22 DIAGNOSIS — M1120 Other chondrocalcinosis, unspecified site: Secondary | ICD-10-CM | POA: Diagnosis not present

## 2015-01-22 DIAGNOSIS — Z0001 Encounter for general adult medical examination with abnormal findings: Secondary | ICD-10-CM | POA: Diagnosis not present

## 2015-01-22 DIAGNOSIS — I779 Disorder of arteries and arterioles, unspecified: Secondary | ICD-10-CM | POA: Diagnosis not present

## 2015-01-23 ENCOUNTER — Ambulatory Visit (INDEPENDENT_AMBULATORY_CARE_PROVIDER_SITE_OTHER): Payer: Commercial Managed Care - HMO | Admitting: *Deleted

## 2015-01-23 DIAGNOSIS — I482 Chronic atrial fibrillation, unspecified: Secondary | ICD-10-CM

## 2015-01-23 DIAGNOSIS — Z5181 Encounter for therapeutic drug level monitoring: Secondary | ICD-10-CM

## 2015-01-23 LAB — POCT INR: INR: 1.6

## 2015-01-30 ENCOUNTER — Ambulatory Visit (INDEPENDENT_AMBULATORY_CARE_PROVIDER_SITE_OTHER): Payer: Commercial Managed Care - HMO | Admitting: *Deleted

## 2015-01-30 DIAGNOSIS — I482 Chronic atrial fibrillation, unspecified: Secondary | ICD-10-CM

## 2015-01-30 DIAGNOSIS — Z5181 Encounter for therapeutic drug level monitoring: Secondary | ICD-10-CM | POA: Diagnosis not present

## 2015-01-30 LAB — POCT INR: INR: 2.7

## 2015-02-06 ENCOUNTER — Ambulatory Visit (INDEPENDENT_AMBULATORY_CARE_PROVIDER_SITE_OTHER): Payer: Commercial Managed Care - HMO | Admitting: *Deleted

## 2015-02-06 DIAGNOSIS — I482 Chronic atrial fibrillation, unspecified: Secondary | ICD-10-CM

## 2015-02-06 DIAGNOSIS — Z5181 Encounter for therapeutic drug level monitoring: Secondary | ICD-10-CM

## 2015-02-06 LAB — POCT INR: INR: 2.1

## 2015-02-20 DIAGNOSIS — L259 Unspecified contact dermatitis, unspecified cause: Secondary | ICD-10-CM | POA: Diagnosis not present

## 2015-02-20 DIAGNOSIS — Z7901 Long term (current) use of anticoagulants: Secondary | ICD-10-CM | POA: Diagnosis not present

## 2015-02-20 LAB — POCT INR: INR: 2.6

## 2015-02-22 ENCOUNTER — Ambulatory Visit (INDEPENDENT_AMBULATORY_CARE_PROVIDER_SITE_OTHER): Payer: Commercial Managed Care - HMO | Admitting: Internal Medicine

## 2015-02-22 DIAGNOSIS — I482 Chronic atrial fibrillation, unspecified: Secondary | ICD-10-CM

## 2015-02-22 DIAGNOSIS — Z5181 Encounter for therapeutic drug level monitoring: Secondary | ICD-10-CM

## 2015-03-20 LAB — PROTIME-INR: INR: 1.7 — AB (ref 0.9–1.1)

## 2015-03-22 ENCOUNTER — Encounter: Payer: Self-pay | Admitting: Interventional Cardiology

## 2015-03-22 ENCOUNTER — Ambulatory Visit (INDEPENDENT_AMBULATORY_CARE_PROVIDER_SITE_OTHER): Payer: Commercial Managed Care - HMO | Admitting: Cardiology

## 2015-03-22 DIAGNOSIS — I482 Chronic atrial fibrillation, unspecified: Secondary | ICD-10-CM

## 2015-03-22 DIAGNOSIS — Z5181 Encounter for therapeutic drug level monitoring: Secondary | ICD-10-CM

## 2015-04-03 ENCOUNTER — Ambulatory Visit (INDEPENDENT_AMBULATORY_CARE_PROVIDER_SITE_OTHER): Payer: Medicare HMO | Admitting: *Deleted

## 2015-04-03 DIAGNOSIS — I482 Chronic atrial fibrillation, unspecified: Secondary | ICD-10-CM

## 2015-04-03 DIAGNOSIS — Z5181 Encounter for therapeutic drug level monitoring: Secondary | ICD-10-CM

## 2015-04-03 LAB — POCT INR: INR: 1.4

## 2015-04-10 ENCOUNTER — Ambulatory Visit (INDEPENDENT_AMBULATORY_CARE_PROVIDER_SITE_OTHER): Payer: Medicare HMO | Admitting: *Deleted

## 2015-04-10 DIAGNOSIS — I482 Chronic atrial fibrillation, unspecified: Secondary | ICD-10-CM

## 2015-04-10 DIAGNOSIS — Z5181 Encounter for therapeutic drug level monitoring: Secondary | ICD-10-CM | POA: Diagnosis not present

## 2015-04-10 LAB — POCT INR: INR: 1.8

## 2015-04-17 ENCOUNTER — Ambulatory Visit (INDEPENDENT_AMBULATORY_CARE_PROVIDER_SITE_OTHER): Payer: Medicare HMO | Admitting: *Deleted

## 2015-04-17 DIAGNOSIS — I482 Chronic atrial fibrillation, unspecified: Secondary | ICD-10-CM

## 2015-04-17 DIAGNOSIS — Z5181 Encounter for therapeutic drug level monitoring: Secondary | ICD-10-CM

## 2015-04-17 LAB — POCT INR: INR: 2.7

## 2015-04-22 ENCOUNTER — Encounter (HOSPITAL_COMMUNITY): Payer: Self-pay | Admitting: Cardiology

## 2015-04-22 ENCOUNTER — Emergency Department (HOSPITAL_COMMUNITY)
Admission: EM | Admit: 2015-04-22 | Discharge: 2015-04-22 | Disposition: A | Discharge status: Home / Self Care | Payer: Medicare HMO | Attending: Emergency Medicine | Admitting: Emergency Medicine

## 2015-04-22 DIAGNOSIS — Z79899 Other long term (current) drug therapy: Secondary | ICD-10-CM | POA: Insufficient documentation

## 2015-04-22 DIAGNOSIS — Z85828 Personal history of other malignant neoplasm of skin: Secondary | ICD-10-CM | POA: Insufficient documentation

## 2015-04-22 DIAGNOSIS — Z87891 Personal history of nicotine dependence: Secondary | ICD-10-CM | POA: Diagnosis not present

## 2015-04-22 DIAGNOSIS — I10 Essential (primary) hypertension: Secondary | ICD-10-CM | POA: Diagnosis not present

## 2015-04-22 DIAGNOSIS — M10031 Idiopathic gout, right wrist: Secondary | ICD-10-CM | POA: Diagnosis not present

## 2015-04-22 DIAGNOSIS — M19049 Primary osteoarthritis, unspecified hand: Secondary | ICD-10-CM | POA: Insufficient documentation

## 2015-04-22 DIAGNOSIS — M19031 Primary osteoarthritis, right wrist: Secondary | ICD-10-CM | POA: Insufficient documentation

## 2015-04-22 DIAGNOSIS — Z8719 Personal history of other diseases of the digestive system: Secondary | ICD-10-CM | POA: Diagnosis not present

## 2015-04-22 DIAGNOSIS — I4891 Unspecified atrial fibrillation: Secondary | ICD-10-CM | POA: Insufficient documentation

## 2015-04-22 DIAGNOSIS — Z7901 Long term (current) use of anticoagulants: Secondary | ICD-10-CM | POA: Insufficient documentation

## 2015-04-22 DIAGNOSIS — R2231 Localized swelling, mass and lump, right upper limb: Secondary | ICD-10-CM | POA: Diagnosis present

## 2015-04-22 LAB — I-STAT CHEM 8, ED
BUN: 35 mg/dL — AB (ref 6–20)
CHLORIDE: 109 mmol/L (ref 101–111)
Calcium, Ion: 1.22 mmol/L (ref 1.13–1.30)
Creatinine, Ser: 1.2 mg/dL (ref 0.61–1.24)
Glucose, Bld: 157 mg/dL — ABNORMAL HIGH (ref 65–99)
HEMATOCRIT: 34 % — AB (ref 39.0–52.0)
Hemoglobin: 11.6 g/dL — ABNORMAL LOW (ref 13.0–17.0)
Potassium: 4.1 mmol/L (ref 3.5–5.1)
SODIUM: 144 mmol/L (ref 135–145)
TCO2: 23 mmol/L (ref 0–100)

## 2015-04-22 MED ORDER — HYDROCODONE-ACETAMINOPHEN 5-325 MG PO TABS
1.0000 | ORAL_TABLET | ORAL | Status: DC | PRN
Start: 2015-04-22 — End: 2015-07-11

## 2015-04-22 MED ORDER — COLCHICINE 0.6 MG PO TABS: 0.6000 mg | ORAL_TABLET | Freq: Every day | ORAL | Status: DC

## 2015-04-22 MED ORDER — PREDNISONE 50 MG PO TABS: 50.0000 mg | ORAL_TABLET | Freq: Every day | ORAL | Status: DC

## 2015-04-22 MED ORDER — HYDROCODONE-ACETAMINOPHEN 5-325 MG PO TABS
1.0000 | ORAL_TABLET | Freq: Once | ORAL | Status: AC
  Administered 2015-04-22: 1 via ORAL
  Filled 2015-04-22: qty 1

## 2015-04-22 NOTE — Discharge Instructions (Signed)
Return to the ED with any concerns including fever/chills, increased area of redness, decreased level of alertness/lethargy, or any other alarming symptoms

## 2015-04-22 NOTE — ED Provider Notes (Signed)
CSN: [id number]     Arrival date & time 04/22/15  0905 History   First MD Initiated Contact with Patient 04/22/15 [phone]     Chief Complaint  Patient presents with  . Gout     (Consider location/radiation/quality/duration/timing/severity/associated sxs/prior Treatment) HPI  Pt presenting with c/o right wrist and hand with swelling and pain.  Pt states symptoms started approx 2 days ago.  No fever.  No trauma to hand.  Has hx of gout in the same wrist in the past as well as in his knee.  He has not had any treatment for his symptoms.  Pain is constant.  Worse with movement and palpation.  There are no other associated systemic symptoms, there are no other alleviating or modifying factors.   Past Medical History  Diagnosis Date  . Hypertension   . Diverticula of colon   . Atrial fibrillation ([place])   . Skin cancer of face   . Knee pain, right     "twisted it couple days ago; pain getting worse since" (02/25/2012)  . Arthritis     "right wrist and hand" (02/25/2012)  . Enlarged prostate   . Carotid artery occlusion    Past Surgical History  Procedure Laterality Date  . Colon resection  ~ 2010  . Inguinal hernia repair  ~ 2011    "left" (02/25/2012)  . Total hip arthroplasty  1980's    "right" (02/25/2012)  . Skin cancer excision      "q once in awhile; on my face" (02/25/2012)  . Carotid endarterectomy  12/02/2009    RIGHT  cea   Family History  Problem Relation Age of Onset  . Heart disease Father     Heart Disease before age 58  . Heart attack Father   . Healthy Mother   . Hypertension Brother   . Heart attack Brother   . Hypertension Brother   . Stroke Brother   . Heart disease Brother   . Hypertension Brother   . Stroke Brother   . Heart disease Brother   . Cancer Sister    Social History  Substance Use Topics  . Smoking status: Former Smoker -- 1.00 packs/day for 33 years    Types: Cigarettes    Quit date: 03/16/1978  . Smokeless tobacco: Never Used   Comment: 02/25/2012 "quit smoking in 1980"  . Alcohol Use: No    Review of Systems  ROS reviewed and all otherwise negative except for mentioned in HPI    Allergies  Review of patient's allergies indicates no known allergies.  Home Medications   Prior to Admission medications   Medication Sig Start Date End Date Taking? Authorizing Provider  amLODipine (NORVASC) 5 MG tablet Take 5 mg by mouth daily.    Historical Provider, MD  atenolol (TENORMIN) 25 MG tablet Take 25 mg by mouth daily.    Historical Provider, MD  colchicine 0.6 MG tablet Take 1 tablet (0.6 mg total) by mouth daily. 04/22/15   Alfonzo Beers, MD  digoxin (LANOXIN) 0.125 MG tablet Take 1 tablet (0.125 mg total) by mouth daily. 09/28/14   Liliane Shi, PA-C  HYDROcodone-acetaminophen (NORCO/VICODIN) 5-325 MG tablet Take 1 tablet by mouth every 4 (four) hours as needed. 04/22/15   Alfonzo Beers, MD  MULTIPLE VITAMINS PO Take   1 tablet daily    Historical Provider, MD  Omega-3 Fatty Acids (FISH OIL) 1000 MG CAPS Take 1,000 mg by mouth daily.    Historical Provider, MD  predniSONE (DELTASONE) 50  MG tablet Take 1 tablet (50 mg total) by mouth daily. 04/22/15   Alfonzo Beers, MD  quinapril (ACCUPRIL) 10 MG tablet Take 10 mg by mouth daily.    Historical Provider, MD  Tamsulosin HCl (FLOMAX) 0.4 MG CAPS Take 0.4 mg by mouth daily.    Historical Provider, MD  warfarin (COUMADIN) 5 MG tablet Take 5 mg by mouth daily.    Historical Provider, MD   BP 165/80 mmHg  Pulse 89  Temp(Src) 98.9 F (37.2 C) (Oral)  Resp 18  SpO2 99%  Vitals reviewed Physical Exam  Physical Examination: General appearance - alert, well appearing, and in no distress Mental status - alert, oriented to person, place, and time Eyes - no conjunctival injection no scleral icterus Mouth - mucous membranes moist, pharynx normal without lesions Chest - clear to auscultation, no wheezes, rales or rhonchi, symmetric air entry Heart - normal rate, regular rhythm,  normal S1, S2, no murmurs, rubs, clicks or gallops Neurological - alert, oriented, normal speech, strength and sensation intact in right fingers/hand Musculoskeletal - ttp with swelling overlying dorsum of right wrist also some overlying erythema, otherwise no joint tenderness, deformity or swelling Extremities - peripheral pulses normal, no pedal edema, no clubbing or cyanosis Skin - normal coloration and turgor, no rashes  ED Course  Procedures (including critical care time) Labs Review Labs Reviewed  I-STAT CHEM 8, ED - Abnormal; Notable for the following:    BUN 35 (*)    Glucose, Bld 157 (*)    Hemoglobin 11.6 (*)    HCT 34.0 (*)    All other components within normal limits    Imaging Review No results found. I have personally reviewed and evaluated these images and lab results as part of my medical decision-making.   EKG Interpretation None      MDM   Final diagnoses:  Acute idiopathic gout of right wrist    Pt presenting with c/o pain in right wrist c/w prior gout flares in that same wrist.  On exam wrist is erythematous swollen, tender to palpation.  Pt started on colchicine (normal renal function) and advised to f/u with PMD.  Discharged with strict return precautions.  Pt agreeable with plan.    Alfonzo Beers, MD 04/23/15 [phone]

## 2015-04-22 NOTE — ED Notes (Signed)
Pt reports right hand pain that radiates up into his elbow. Reports a hx of gout.

## 2015-04-25 ENCOUNTER — Telehealth: Payer: Self-pay | Admitting: *Deleted

## 2015-04-25 ENCOUNTER — Other Ambulatory Visit: Payer: Self-pay | Admitting: Internal Medicine

## 2015-04-25 ENCOUNTER — Other Ambulatory Visit: Payer: Self-pay | Admitting: *Deleted

## 2015-04-25 DIAGNOSIS — I482 Chronic atrial fibrillation, unspecified: Secondary | ICD-10-CM

## 2015-04-25 LAB — PROTIME-INR: INR: 5.9 — AB (ref 0.9–1.1)

## 2015-04-25 MED ORDER — WARFARIN SODIUM 5 MG PO TABS: ORAL_TABLET | ORAL | Status: DC

## 2015-04-25 MED ORDER — DIGOXIN 125 MCG PO TABS: 0.1250 mg | ORAL_TABLET | Freq: Every day | ORAL | Status: AC

## 2015-04-25 NOTE — Telephone Encounter (Signed)
Patient's daughter called to inform us that he had his INR done today at Dr. Lina Sar office. She stated that he had an INR drawn today and Dr. Lina Sar office was suppose to call [facility] with results. I called Dr. Lina Sar office to ensure they are aware to call or fax results to [facility]. Had to leave a message for the nurse. Spoke with Dora-secretary & she will fax results when they become available; she was given our [facility] fax number.  Also, called the patient's dtr- had to leave a message to inform her that we have not received the lab results & that we will call her once they are faxed to [place].

## 2015-04-25 NOTE — Telephone Encounter (Signed)
yes

## 2015-04-25 NOTE — Telephone Encounter (Signed)
appt made

## 2015-04-25 NOTE — Telephone Encounter (Signed)
Refill done for Warfarin sent to [facility]

## 2015-04-25 NOTE — Telephone Encounter (Signed)
Follow up       *STAT* If patient is at the pharmacy, call can be transferred to refill team.   1. Which medications need to be refilled? (please list name of each medication and dose if known) digoxin .125, warfarin 5mg  2. Which pharmacy/location (including street and city if local pharmacy) is medication to be sent to? [facility] [phone] pharmacy  3. Do they need a 30 day or 90 day supply? 90 day supply

## 2015-04-25 NOTE — Telephone Encounter (Signed)
Pt is requesting to establish with you. He states that you told Kennith Maes you will be willing to see him. Please advise

## 2015-04-26 ENCOUNTER — Ambulatory Visit (INDEPENDENT_AMBULATORY_CARE_PROVIDER_SITE_OTHER): Payer: Medicare HMO | Admitting: Cardiology

## 2015-04-26 ENCOUNTER — Telehealth: Payer: Self-pay | Admitting: *Deleted

## 2015-04-26 DIAGNOSIS — I482 Chronic atrial fibrillation, unspecified: Secondary | ICD-10-CM

## 2015-04-26 DIAGNOSIS — Z5181 Encounter for therapeutic drug level monitoring: Secondary | ICD-10-CM

## 2015-04-26 NOTE — Telephone Encounter (Signed)
Darlene from Dr. Lina Sar office returned a call to [place] regarding the pt's INR that was drawn on 04/25/15.  They were unaware that the pt has been followed by [facility] and they will fax his INR results to [place] today.

## 2015-04-30 ENCOUNTER — Telehealth: Payer: Self-pay | Admitting: Interventional Cardiology

## 2015-04-30 NOTE — Telephone Encounter (Signed)
New Message  Pharmacist for [employer] calling about dijoxin tier exception- she faxed information about alternatives- sotalol, amiodaron etc. Please call back and discuss.

## 2015-05-01 NOTE — Telephone Encounter (Signed)
Spoke with [name] at [facility]. She is approving the Tier Reduction for Digoxin to a Tier 1.

## 2015-05-02 ENCOUNTER — Ambulatory Visit (INDEPENDENT_AMBULATORY_CARE_PROVIDER_SITE_OTHER): Payer: Medicare HMO | Admitting: *Deleted

## 2015-05-02 DIAGNOSIS — Z5181 Encounter for therapeutic drug level monitoring: Secondary | ICD-10-CM

## 2015-05-02 DIAGNOSIS — I482 Chronic atrial fibrillation, unspecified: Secondary | ICD-10-CM

## 2015-05-02 LAB — POCT INR: INR: 1.9

## 2015-05-06 ENCOUNTER — Encounter: Payer: Self-pay | Admitting: Internal Medicine

## 2015-05-06 ENCOUNTER — Ambulatory Visit (INDEPENDENT_AMBULATORY_CARE_PROVIDER_SITE_OTHER)
Admission: RE | Admit: 2015-05-06 | Discharge: 2015-05-06 | Disposition: A | Discharge status: Home / Self Care | Payer: Medicare HMO | Source: Ambulatory Visit | Attending: Internal Medicine | Admitting: Internal Medicine

## 2015-05-06 ENCOUNTER — Other Ambulatory Visit: Payer: Medicare HMO

## 2015-05-06 ENCOUNTER — Ambulatory Visit (INDEPENDENT_AMBULATORY_CARE_PROVIDER_SITE_OTHER): Payer: Medicare HMO | Admitting: Internal Medicine

## 2015-05-06 ENCOUNTER — Telehealth: Payer: Self-pay

## 2015-05-06 VITALS — BP 130/60 | HR 77 | Temp 97.7°F | Resp 16 | Ht 71.0 in | Wt 169.0 lb

## 2015-05-06 DIAGNOSIS — M25531 Pain in right wrist: Secondary | ICD-10-CM

## 2015-05-06 DIAGNOSIS — L02219 Cutaneous abscess of trunk, unspecified: Secondary | ICD-10-CM | POA: Diagnosis not present

## 2015-05-06 DIAGNOSIS — L03319 Cellulitis of trunk, unspecified: Secondary | ICD-10-CM

## 2015-05-06 DIAGNOSIS — M19031 Primary osteoarthritis, right wrist: Secondary | ICD-10-CM | POA: Insufficient documentation

## 2015-05-06 NOTE — Telephone Encounter (Signed)
Patient is starting at this practice, new pcp is dr Ronnald Ramp and he would like to also have his INR checked here----can you please call patient to get him scheduled in coumadin clinic---see tamara if any questions----please call his wife, [name] at [phone] sometime on Tuesday 2/21 for scheduling, thanks

## 2015-05-06 NOTE — Patient Instructions (Signed)
Incision and Drainage Incision and drainage is a procedure in which a sac-like structure (cystic structure) is opened and drained. The area to be drained usually contains material such as pus, fluid, or blood.  LET YOUR CAREGIVER KNOW ABOUT:   Allergies to medicine.  Medicines taken, including vitamins, herbs, eyedrops, over-the-counter medicines, and creams.  Use of steroids (by mouth or creams).  Previous problems with anesthetics or numbing medicines.  History of bleeding problems or blood clots.  Previous surgery.  Other health problems, including diabetes and kidney problems.  Possibility of pregnancy, if this applies. RISKS AND COMPLICATIONS  Pain.  Bleeding.  Scarring.  Infection. BEFORE THE PROCEDURE  You may need to have an ultrasound or other imaging tests to see how large or deep your cystic structure is. Blood tests may also be used to determine if you have an infection or how severe the infection is. You may need to have a tetanus shot. PROCEDURE  The affected area is cleaned with a cleaning fluid. The cyst area will then be numbed with a medicine (local anesthetic). A small incision will be made in the cystic structure. A syringe or catheter may be used to drain the contents of the cystic structure, or the contents may be squeezed out. The area will then be flushed with a cleansing solution. After cleansing the area, it is often gently packed with a gauze or another wound dressing. Once it is packed, it will be covered with gauze and tape or some other type of wound dressing. AFTER THE PROCEDURE   Often, you will be allowed to go home right after the procedure.  You may be given antibiotic medicine to prevent or heal an infection.  If the area was packed with gauze or some other wound dressing, you will likely need to come back in 1 to 2 days to get it removed.  The area should heal in about 14 days.   This information is not intended to replace advice given  to you by your health care provider. Make sure you discuss any questions you have with your health care provider.   Document Released: 08/26/2000 Document Revised: 09/01/2011 Document Reviewed: 04/27/2011 Elsevier Interactive Patient Education 2016 Elsevier Inc.  

## 2015-05-06 NOTE — Progress Notes (Signed)
Pre visit review using our clinic review tool, if applicable. No additional management support is needed unless otherwise documented below in the visit note. 

## 2015-05-06 NOTE — Progress Notes (Signed)
Subjective:  Patient ID: [name], male    DOB: Nov 22, 1923  Age: 82 y.o. MRN: [id number]  CC: Wrist Pain and Abscess  NEW TO ME  HPI [name] presents for complaints of right wrist pain and swelling for about 3-4 weeks. He describes a remote history of injury but doesn't think anything has happened recently. He was recently seen in emergency room and treated for presumptive gout with colchicine and says he is not much better. He takes Coumadin so NSAIDs or too risky to consider.  He also complains of a cyst on the right side of his mid back. It is been there for many years but over the last 3 or 4 days it has become red, painful, and swollen.  History [name] has a past medical history of Hypertension; Diverticula of colon; Atrial fibrillation ([place]); Skin cancer of face; Knee pain, right; Arthritis; Enlarged prostate; and Carotid artery occlusion.   He has past surgical history that includes Colon resection (~ 2010); Inguinal hernia repair (~ 2011); Total hip arthroplasty (1980's); Skin cancer excision; and Carotid endarterectomy (12/02/2009).   His family history includes Cancer in his sister; Healthy in his mother; Heart attack in his brother and father; Heart disease in his brother, brother, and father; Hypertension in his brother, brother, and brother; Stroke in his brother and brother.He reports that he quit smoking about 37 years ago. His smoking use included Cigarettes. He has a 33 pack-year smoking history. He has never used smokeless tobacco. He reports that he does not drink alcohol or use illicit drugs.  Outpatient Prescriptions Prior to Visit  Medication Sig Dispense Refill  . amLODipine (NORVASC) 5 MG tablet Take 5 mg by mouth daily.    Marland Kitchen atenolol (TENORMIN) 25 MG tablet Take 25 mg by mouth daily.    . colchicine 0.6 MG tablet Take 1 tablet (0.6 mg total) by mouth daily. 30 tablet 0  . digoxin (LANOXIN) 0.125 MG tablet Take 1 tablet (0.125 mg total) by mouth daily. 90  tablet 3  . HYDROcodone-acetaminophen (NORCO/VICODIN) 5-325 MG tablet Take 1 tablet by mouth every 4 (four) hours as needed. 6 tablet 0  . MULTIPLE VITAMINS PO Take   1 tablet daily    . Omega-3 Fatty Acids (FISH OIL) 1000 MG CAPS Take 1,000 mg by mouth daily.    . quinapril (ACCUPRIL) 10 MG tablet Take 10 mg by mouth daily.    . Tamsulosin HCl (FLOMAX) 0.4 MG CAPS Take 0.4 mg by mouth daily.    Marland Kitchen warfarin (COUMADIN) 5 MG tablet Take as directed by coumadin clinic 150 tablet 0   No facility-administered medications prior to visit.    ROS Review of Systems  Constitutional: Negative.  Negative for fever, chills, diaphoresis, appetite change and fatigue.  HENT: Negative.   Eyes: Negative.   Respiratory: Negative.  Negative for cough, choking, chest tightness, shortness of breath and stridor.   Cardiovascular: Negative.  Negative for chest pain, palpitations and leg swelling.  Gastrointestinal: Negative.  Negative for nausea, vomiting, abdominal pain, diarrhea, constipation and blood in stool.  Endocrine: Negative.   Genitourinary: Negative.   Musculoskeletal: Positive for arthralgias. Negative for myalgias, back pain, joint swelling and neck stiffness.  Skin: Negative for color change and rash.  Allergic/Immunologic: Negative.   Neurological: Negative.  Negative for dizziness, tremors, light-headedness and numbness.  Hematological: Negative.  Negative for adenopathy. Does not bruise/bleed easily.  Psychiatric/Behavioral: Negative.     Objective:  BP 130/60 mmHg  Pulse 77  Temp(Src) 97.7 F (36.5 C) (Oral)  Resp 16  Ht 5\' 11"  (1.803 m)  Wt 169 lb (76.658 kg)  BMI 23.58 kg/m2  SpO2 96%  Physical Exam  Constitutional: No distress.  HENT:  Mouth/Throat: Oropharynx is clear and moist. No oropharyngeal exudate.  Eyes: Conjunctivae are normal. Right eye exhibits no discharge. Left eye exhibits no discharge. No scleral icterus.  Neck: Normal range of motion. Neck supple. No JVD  present. No tracheal deviation present. No thyromegaly present.  Cardiovascular: Normal rate, regular rhythm, normal heart sounds and intact distal pulses.  Exam reveals no gallop and no friction rub.   No murmur heard. Pulmonary/Chest: Effort normal and breath sounds normal. No stridor. No respiratory distress. He has no wheezes. He has no rales. He exhibits no tenderness.  Abdominal: Soft. Bowel sounds are normal. He exhibits no distension and no mass. There is no tenderness. There is no rebound and no guarding.  Musculoskeletal:       Right wrist: He exhibits tenderness and swelling. He exhibits normal range of motion, no bony tenderness, no effusion and no deformity.       Arms: Lymphadenopathy:    He has no cervical adenopathy.  Skin: No rash noted. He is not diaphoretic.     The area over the back was cleaned with Betadine. Local anesthesia was obtained with 2 mL of 2% lidocaine with epi. Next a 6 mm punch incision was made into the fluctuance. A scant amount of purulent exudate and lg quantity of sebaceous cyst was easily removed. The cavity was irrigated with hydrogen peroxide and packed with iodoform. There was very minimal bleeding but no significant blood loss. Dressing was applied. He tolerated it well.  Vitals reviewed.   Lab Results  Component Value Date   WBC 7.8 08/06/2012   HGB 11.6* 04/22/2015   HCT 34.0* 04/22/2015   PLT 160 08/06/2012   GLUCOSE 157* 04/22/2015   ALT 20 11/28/2009   AST 21 11/28/2009   NA 144 04/22/2015   K 4.1 04/22/2015   CL 109 04/22/2015   CREATININE 1.20 04/22/2015   BUN 35* 04/22/2015   CO2 23 03/20/2014   INR 1.9 05/02/2015    Assessment & Plan:   Daz was seen today for wrist pain and abscess.  Diagnoses and all orders for this visit:  Right wrist pain - the x-ray does not raise any concern for pseudogout. He has not responded much to colchicine so I doubt this is gout. The x-ray does show DJD and an old ulnar fracture. Will treat  for DJD with Tylenol. -     DG Wrist Complete Right; Future  Cellulitis and abscess of trunk- an incision and drainage has been completed. Culture will be sent but at this time it looks like this was an inflamed sebaceous cyst and not an infection. If the culture is significantly positive then I will prescribe an appropriate antibiotic. The cavity was packed with iodoform and I have asked him to return in 2-3 days for packing removal and a recheck of the wound. -     Wound culture; Future   I am having Mr. Mccammon maintain his tamsulosin, Fish Oil, MULTIPLE VITAMINS PO, amLODipine, atenolol, quinapril, colchicine, HYDROcodone-acetaminophen, warfarin, and digoxin.  No orders of the defined types were placed in this encounter.     Follow-up: Return in about 2 days (around 05/08/2015).  Scarlette Calico, MD

## 2015-05-08 ENCOUNTER — Encounter: Payer: Self-pay | Admitting: Internal Medicine

## 2015-05-08 ENCOUNTER — Ambulatory Visit (INDEPENDENT_AMBULATORY_CARE_PROVIDER_SITE_OTHER): Payer: Medicare HMO | Admitting: Internal Medicine

## 2015-05-08 ENCOUNTER — Ambulatory Visit (INDEPENDENT_AMBULATORY_CARE_PROVIDER_SITE_OTHER): Payer: Medicare HMO | Admitting: General Practice

## 2015-05-08 VITALS — BP 124/70 | HR 78 | Temp 97.9°F | Resp 16 | Ht 71.0 in | Wt 168.0 lb

## 2015-05-08 DIAGNOSIS — I4891 Unspecified atrial fibrillation: Secondary | ICD-10-CM

## 2015-05-08 DIAGNOSIS — Z23 Encounter for immunization: Secondary | ICD-10-CM

## 2015-05-08 DIAGNOSIS — M19031 Primary osteoarthritis, right wrist: Secondary | ICD-10-CM

## 2015-05-08 DIAGNOSIS — Z5181 Encounter for therapeutic drug level monitoring: Secondary | ICD-10-CM | POA: Diagnosis not present

## 2015-05-08 DIAGNOSIS — L02219 Cutaneous abscess of trunk, unspecified: Secondary | ICD-10-CM

## 2015-05-08 DIAGNOSIS — I482 Chronic atrial fibrillation, unspecified: Secondary | ICD-10-CM

## 2015-05-08 DIAGNOSIS — L03319 Cellulitis of trunk, unspecified: Secondary | ICD-10-CM

## 2015-05-08 LAB — POCT INR: INR: 2.2

## 2015-05-08 MED ORDER — DICLOFENAC SODIUM 1 % TD GEL: 2.0000 g | Freq: Four times a day (QID) | TRANSDERMAL | Status: AC

## 2015-05-08 NOTE — Progress Notes (Signed)
Pre visit review using our clinic review tool, if applicable. No additional management support is needed unless otherwise documented below in the visit note. 

## 2015-05-08 NOTE — Progress Notes (Signed)
I have reviewed and agree with the plan. 

## 2015-05-08 NOTE — Patient Instructions (Signed)
Wound Care °Taking care of your wound properly can help to prevent pain and infection. It can also help your wound to heal more quickly.  °HOW TO CARE FOR YOUR WOUND  °· Take or apply over-the-counter and prescription medicines only as told by your health care provider. °· If you were prescribed antibiotic medicine, take or apply it as told by your health care provider. Do not stop using the antibiotic even if your condition improves. °· Clean the wound each day or as told by your health care provider. °¨ Wash the wound with mild soap and water. °¨ Rinse the wound with water to remove all soap. °¨ Pat the wound dry with a clean towel. Do not rub it. °· There are many different ways to close and cover a wound. For example, a wound can be covered with stitches (sutures), skin glue, or adhesive strips. Follow instructions from your health care provider about: °¨ How to take care of your wound. °¨ When and how you should change your bandage (dressing). °¨ When you should remove your dressing. °¨ Removing whatever was used to close your wound. °· Check your wound every day for signs of infection. Watch for: °¨ Redness, swelling, or pain. °¨ Fluid, blood, or pus. °· Keep the dressing dry until your health care provider says it can be removed. Do not take baths, swim, use a hot tub, or do anything that would put your wound underwater until your health care provider approves. °· Raise (elevate) the injured area above the level of your heart while you are sitting or lying down. °· Do not scratch or pick at the wound. °· Keep all follow-up visits as told by your health care provider. This is important. °SEEK MEDICAL CARE IF: °· You received a tetanus shot and you have swelling, severe pain, redness, or bleeding at the injection site. °· You have a fever. °· Your pain is not controlled with medicine. °· You have increased redness, swelling, or pain at the site of your wound. °· You have fluid, blood, or pus coming from your  wound. °· You notice a bad smell coming from your wound or your dressing. °SEEK IMMEDIATE MEDICAL CARE IF: °· You have a red streak going away from your wound. °  °This information is not intended to replace advice given to you by your health care provider. Make sure you discuss any questions you have with your health care provider. °  °Document Released: 12/10/2007 Document Revised: 07/17/2014 Document Reviewed: 02/26/2014 °Elsevier Interactive Patient Education ©2016 Elsevier Inc. ° °

## 2015-05-08 NOTE — Progress Notes (Signed)
Subjective:  Patient ID: [name], male    DOB: 1923-04-20  Age: 82 y.o. MRN: [id number]  CC: Osteoarthritis and Wound Check   HPI [name] presents for follow-up on 2 issues, one is right wrist pain and swelling in the other is a wound check status post incision and drainage.  When I last saw him he had complained of pain and swelling in the right wrist, x-rays showed an old distal ulnar fracture and osteoarthritis. He was to know what he can do about the pain and swelling.  The area on his right back status post incision and drainage is healing nicely. The original Band-Aid and packing is intact. He reports that the pain and swelling at the site has diminished. There is been no drainage, bleeding, redness streaking from the site. He also denies fever, chills, nausea, vomiting.  Outpatient Prescriptions Prior to Visit  Medication Sig Dispense Refill  . amLODipine (NORVASC) 5 MG tablet Take 5 mg by mouth daily.    Marland Kitchen atenolol (TENORMIN) 25 MG tablet Take 25 mg by mouth daily.    . colchicine 0.6 MG tablet Take 1 tablet (0.6 mg total) by mouth daily. 30 tablet 0  . digoxin (LANOXIN) 0.125 MG tablet Take 1 tablet (0.125 mg total) by mouth daily. 90 tablet 3  . HYDROcodone-acetaminophen (NORCO/VICODIN) 5-325 MG tablet Take 1 tablet by mouth every 4 (four) hours as needed. 6 tablet 0  . MULTIPLE VITAMINS PO Take   1 tablet daily    . Omega-3 Fatty Acids (FISH OIL) 1000 MG CAPS Take 1,000 mg by mouth daily.    . quinapril (ACCUPRIL) 10 MG tablet Take 10 mg by mouth daily.    . Tamsulosin HCl (FLOMAX) 0.4 MG CAPS Take 0.4 mg by mouth daily.    Marland Kitchen warfarin (COUMADIN) 5 MG tablet Take as directed by coumadin clinic 150 tablet 0   No facility-administered medications prior to visit.    ROS Review of Systems  Constitutional: Negative.  Negative for chills.  HENT: Negative.   Eyes: Negative.   Respiratory: Negative.  Negative for cough, choking, chest tightness, shortness of breath  and stridor.   Cardiovascular: Negative.  Negative for chest pain, palpitations and leg swelling.  Gastrointestinal: Negative.  Negative for abdominal pain.  Endocrine: Negative.   Genitourinary: Negative.   Musculoskeletal: Positive for arthralgias.  Skin: Positive for wound. Negative for color change and rash.  Allergic/Immunologic: Negative.   Neurological: Negative.  Negative for dizziness and light-headedness.  Hematological: Negative.  Negative for adenopathy. Does not bruise/bleed easily.  Psychiatric/Behavioral: Negative.     Objective:  BP 124/70 mmHg  Pulse 78  Temp(Src) 97.9 F (36.6 C) (Oral)  Ht 5\' 11"  (1.803 m)  Wt 168 lb (76.204 kg)  BMI 23.44 kg/m2  SpO2 98%  BP Readings from Last 3 Encounters:  05/08/15 124/70  05/06/15 130/60  04/22/15 165/80    Wt Readings from Last 3 Encounters:  05/08/15 168 lb (76.204 kg)  05/06/15 169 lb (76.658 kg)  09/28/14 167 lb (75.751 kg)    Physical Exam  Constitutional: No distress.  HENT:  Mouth/Throat: Oropharynx is clear and moist. No oropharyngeal exudate.  Eyes: Conjunctivae are normal. Right eye exhibits no discharge. Left eye exhibits no discharge. No scleral icterus.  Neck: Normal range of motion. Neck supple. No JVD present. No tracheal deviation present. No thyromegaly present.  Cardiovascular: Normal rate, regular rhythm, normal heart sounds and intact distal pulses.  Exam reveals no gallop and  no friction rub.   No murmur heard. Pulmonary/Chest: Effort normal and breath sounds normal. No stridor. No respiratory distress. He has no wheezes. He has no rales. He exhibits no tenderness.  Abdominal: Soft. Bowel sounds are normal. He exhibits no distension and no mass. There is no tenderness. There is no rebound and no guarding.  Musculoskeletal:       Right forearm: He exhibits tenderness and swelling. He exhibits no bony tenderness, no edema, no deformity and no laceration.  Lymphadenopathy:    He has no cervical  adenopathy.  Skin: No rash noted. He is not diaphoretic.     Dressing was removed and the iodoform packing was removed. The cavity is healing nicely, there is no additional exudate or sebaceous cyst to be removed. The surrounding skin shows a marked decrease in erythema and swelling. I cleaned the cavity and packed it with triple antibiotic ointment.  Vitals reviewed.   Lab Results  Component Value Date   WBC 7.8 08/06/2012   HGB 11.6* 04/22/2015   HCT 34.0* 04/22/2015   PLT 160 08/06/2012   GLUCOSE 157* 04/22/2015   ALT 20 11/28/2009   AST 21 11/28/2009   NA 144 04/22/2015   K 4.1 04/22/2015   CL 109 04/22/2015   CREATININE 1.20 04/22/2015   BUN 35* 04/22/2015   CO2 23 03/20/2014   INR 2.2 05/08/2015    Dg Wrist Complete Right  05/06/2015  CLINICAL DATA:  Lateral wrist pain for 3 weeks, no known injury, initial encounter EXAM: RIGHT WRIST - COMPLETE 3+ VIEW COMPARISON:  None. FINDINGS: Significant degenerative changes are noted in the radiocarpal and ulnar carpal articulation. Remodeling of the scaphoid is seen. There are changes consistent with prior ulnar styloid fracture with nonunion. Cartilage calcification is noted as well. No acute soft tissue abnormality is noted. No acute fracture is seen. IMPRESSION: Degenerative change without acute abnormality. 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 Electronically Signed   By: Inez Catalina M.D.   On: 05/06/2015 16:37   2d ago     Gram Stain AbundantP   Gram Stain WBC present-both PMN and MononuclearP   Gram Stain No Squamous Epithelial Cells SeenP   Gram Stain Abundant Gram Positive Cocci In Pairs In ClustersP   Gram Stain Few Gram Positive RodsP   Preliminary Report Culture reincubated for better growthP           Assessment & Plan:   [name] was seen today for osteoarthritis and wound check.  Diagnoses and all orders for this visit:  Primary osteoarthritis of right wrist- we'll try a topical option  for pain and swelling since he is already anticoagulated and NSAIDs are not a good option. -     diclofenac sodium (VOLTAREN) 1 % GEL; Apply 2 g topically 4 (four) times daily.  Need for 23-polyvalent pneumococcal polysaccharide vaccine -     Pneumococcal polysaccharide vaccine 23-valent greater than or equal to 2yo subcutaneous/IM  Need for Tdap vaccination -     Tdap vaccine greater than or equal to 7yo IM  Cellulitis and abscess of trunk- the above culture result was reviewed, it appears that there may have been a polymicrobial infection, recheck today shows that the infection has resolved, I gave his daughter 25 packets of triple antibiotic ointment and she will check the wound every day and irrigate it with the antibiotic ointment. She will bring him back if there is any increased pain, drainage, redness, or swelling.   I am having  [name] start on diclofenac sodium. I am also having him maintain his tamsulosin, Fish Oil, MULTIPLE VITAMINS PO, amLODipine, atenolol, quinapril, colchicine, HYDROcodone-acetaminophen, warfarin, and digoxin.  Meds ordered this encounter  Medications  . diclofenac sodium (VOLTAREN) 1 % GEL    Sig: Apply 2 g topically 4 (four) times daily.    Dispense:  100 g    Refill:  11     Follow-up: Return in about 3 months (around 08/05/2015).  Scarlette Calico, MD

## 2015-05-09 LAB — WOUND CULTURE
Gram Stain: NONE SEEN
Organism ID, Bacteria: NORMAL

## 2015-05-31 ENCOUNTER — Ambulatory Visit (INDEPENDENT_AMBULATORY_CARE_PROVIDER_SITE_OTHER): Payer: Medicare HMO | Admitting: General Practice

## 2015-05-31 DIAGNOSIS — I4891 Unspecified atrial fibrillation: Secondary | ICD-10-CM | POA: Diagnosis not present

## 2015-05-31 DIAGNOSIS — Z5181 Encounter for therapeutic drug level monitoring: Secondary | ICD-10-CM | POA: Diagnosis not present

## 2015-05-31 DIAGNOSIS — I482 Chronic atrial fibrillation, unspecified: Secondary | ICD-10-CM

## 2015-05-31 LAB — POCT INR: INR: 1.2

## 2015-05-31 NOTE — Progress Notes (Signed)
I have reviewed and agree with the plan. 

## 2015-05-31 NOTE — Progress Notes (Signed)
Pre visit review using our clinic review tool, if applicable. No additional management support is needed unless otherwise documented below in the visit note. 

## 2015-06-13 ENCOUNTER — Telehealth: Payer: Self-pay | Admitting: Internal Medicine

## 2015-06-13 NOTE — Telephone Encounter (Signed)
Pt's daughter is unable to bring him tomorrow and he is needing an afternoon appt. You didn't have any afternoon available next Friday. Would you be able to work him in next Friday afternoon.

## 2015-06-14 ENCOUNTER — Ambulatory Visit: Payer: Medicare HMO

## 2015-06-18 ENCOUNTER — Ambulatory Visit: Payer: Medicare HMO | Admitting: Internal Medicine

## 2015-06-21 ENCOUNTER — Ambulatory Visit (INDEPENDENT_AMBULATORY_CARE_PROVIDER_SITE_OTHER): Payer: Medicare HMO | Admitting: General Practice

## 2015-06-21 DIAGNOSIS — Z5181 Encounter for therapeutic drug level monitoring: Secondary | ICD-10-CM | POA: Diagnosis not present

## 2015-06-21 DIAGNOSIS — I4891 Unspecified atrial fibrillation: Secondary | ICD-10-CM

## 2015-06-21 LAB — POCT INR: INR: 1.3

## 2015-06-21 NOTE — Progress Notes (Signed)
I have reviewed and agree with the plan. 

## 2015-06-21 NOTE — Progress Notes (Signed)
Pre visit review using our clinic review tool, if applicable. No additional management support is needed unless otherwise documented below in the visit note. 

## 2015-07-05 ENCOUNTER — Ambulatory Visit (INDEPENDENT_AMBULATORY_CARE_PROVIDER_SITE_OTHER): Payer: Medicare HMO | Admitting: General Practice

## 2015-07-05 DIAGNOSIS — Z5181 Encounter for therapeutic drug level monitoring: Secondary | ICD-10-CM | POA: Diagnosis not present

## 2015-07-05 DIAGNOSIS — I4891 Unspecified atrial fibrillation: Secondary | ICD-10-CM | POA: Diagnosis not present

## 2015-07-05 DIAGNOSIS — I482 Chronic atrial fibrillation, unspecified: Secondary | ICD-10-CM

## 2015-07-05 LAB — POCT INR: INR: 3

## 2015-07-05 NOTE — Progress Notes (Signed)
Pre visit review using our clinic review tool, if applicable. No additional management support is needed unless otherwise documented below in the visit note. 

## 2015-07-05 NOTE — Progress Notes (Signed)
I have reviewed and agree with the plan. 

## 2015-07-08 ENCOUNTER — Emergency Department (HOSPITAL_COMMUNITY): Payer: Medicare HMO

## 2015-07-08 ENCOUNTER — Encounter (HOSPITAL_COMMUNITY): Payer: Self-pay | Admitting: *Deleted

## 2015-07-08 ENCOUNTER — Emergency Department (HOSPITAL_COMMUNITY)
Admission: EM | Admit: 2015-07-08 | Discharge: 2015-07-08 | Disposition: A | Discharge status: Home / Self Care | Payer: Medicare HMO | Attending: Emergency Medicine | Admitting: Emergency Medicine

## 2015-07-08 DIAGNOSIS — M25511 Pain in right shoulder: Secondary | ICD-10-CM | POA: Insufficient documentation

## 2015-07-08 DIAGNOSIS — Z85828 Personal history of other malignant neoplasm of skin: Secondary | ICD-10-CM | POA: Diagnosis not present

## 2015-07-08 DIAGNOSIS — I4891 Unspecified atrial fibrillation: Secondary | ICD-10-CM | POA: Insufficient documentation

## 2015-07-08 DIAGNOSIS — R531 Weakness: Secondary | ICD-10-CM | POA: Insufficient documentation

## 2015-07-08 DIAGNOSIS — M19041 Primary osteoarthritis, right hand: Secondary | ICD-10-CM | POA: Insufficient documentation

## 2015-07-08 DIAGNOSIS — R079 Chest pain, unspecified: Secondary | ICD-10-CM | POA: Insufficient documentation

## 2015-07-08 DIAGNOSIS — Z87891 Personal history of nicotine dependence: Secondary | ICD-10-CM | POA: Diagnosis not present

## 2015-07-08 DIAGNOSIS — M25512 Pain in left shoulder: Secondary | ICD-10-CM | POA: Insufficient documentation

## 2015-07-08 DIAGNOSIS — Z79899 Other long term (current) drug therapy: Secondary | ICD-10-CM | POA: Diagnosis not present

## 2015-07-08 DIAGNOSIS — M546 Pain in thoracic spine: Secondary | ICD-10-CM | POA: Diagnosis not present

## 2015-07-08 DIAGNOSIS — I10 Essential (primary) hypertension: Secondary | ICD-10-CM | POA: Insufficient documentation

## 2015-07-08 DIAGNOSIS — Z7901 Long term (current) use of anticoagulants: Secondary | ICD-10-CM | POA: Insufficient documentation

## 2015-07-08 DIAGNOSIS — Z8719 Personal history of other diseases of the digestive system: Secondary | ICD-10-CM | POA: Diagnosis not present

## 2015-07-08 DIAGNOSIS — M542 Cervicalgia: Secondary | ICD-10-CM | POA: Insufficient documentation

## 2015-07-08 DIAGNOSIS — N400 Enlarged prostate without lower urinary tract symptoms: Secondary | ICD-10-CM | POA: Diagnosis not present

## 2015-07-08 DIAGNOSIS — M19031 Primary osteoarthritis, right wrist: Secondary | ICD-10-CM | POA: Insufficient documentation

## 2015-07-08 DIAGNOSIS — Z791 Long term (current) use of non-steroidal anti-inflammatories (NSAID): Secondary | ICD-10-CM | POA: Insufficient documentation

## 2015-07-08 LAB — COMPREHENSIVE METABOLIC PANEL
ALBUMIN: 3.5 g/dL (ref 3.5–5.0)
ALT: 18 U/L (ref 17–63)
ANION GAP: 10 (ref 5–15)
AST: 25 U/L (ref 15–41)
Alkaline Phosphatase: 51 U/L (ref 38–126)
BILIRUBIN TOTAL: 1 mg/dL (ref 0.3–1.2)
BUN: 32 mg/dL — AB (ref 6–20)
CHLORIDE: 109 mmol/L (ref 101–111)
CO2: 21 mmol/L — AB (ref 22–32)
Calcium: 10 mg/dL (ref 8.9–10.3)
Creatinine, Ser: 1.26 mg/dL — ABNORMAL HIGH (ref 0.61–1.24)
GFR calc Af Amer: 56 mL/min — ABNORMAL LOW (ref >60)
GFR calc non Af Amer: 48 mL/min — ABNORMAL LOW (ref >60)
GLUCOSE: 125 mg/dL — AB (ref 65–99)
POTASSIUM: 4.5 mmol/L (ref 3.5–5.1)
SODIUM: 140 mmol/L (ref 135–145)
TOTAL PROTEIN: 7.4 g/dL (ref 6.5–8.1)

## 2015-07-08 LAB — URINALYSIS, ROUTINE W REFLEX MICROSCOPIC
Bilirubin Urine: NEGATIVE
GLUCOSE, UA: NEGATIVE mg/dL
KETONES UR: NEGATIVE mg/dL
Nitrite: NEGATIVE
PH: 6.5 (ref 5.0–8.0)
Protein, ur: 100 mg/dL — AB
Specific Gravity, Urine: 1.019 (ref 1.005–1.030)

## 2015-07-08 LAB — PROTIME-INR
INR: 3.13 — ABNORMAL HIGH (ref 0.00–1.49)
Prothrombin Time: 31.6 seconds — ABNORMAL HIGH (ref 11.6–15.2)

## 2015-07-08 LAB — I-STAT CHEM 8, ED
BUN: 35 mg/dL — AB (ref 6–20)
CALCIUM ION: 1.29 mmol/L (ref 1.13–1.30)
CREATININE: 1.2 mg/dL (ref 0.61–1.24)
Chloride: 107 mmol/L (ref 101–111)
Glucose, Bld: 123 mg/dL — ABNORMAL HIGH (ref 65–99)
HCT: 37 % — ABNORMAL LOW (ref 39.0–52.0)
HEMOGLOBIN: 12.6 g/dL — AB (ref 13.0–17.0)
Potassium: 4.5 mmol/L (ref 3.5–5.1)
SODIUM: 142 mmol/L (ref 135–145)
TCO2: 23 mmol/L (ref 0–100)

## 2015-07-08 LAB — I-STAT TROPONIN, ED
TROPONIN I, POC: 0.01 ng/mL (ref 0.00–0.08)
Troponin i, poc: 0.03 ng/mL (ref 0.00–0.08)

## 2015-07-08 LAB — CBC WITH DIFFERENTIAL/PLATELET
BASOS ABS: 0 10*3/uL (ref 0.0–0.1)
Basophils Relative: 0 %
EOS PCT: 0 %
Eosinophils Absolute: 0 10*3/uL (ref 0.0–0.7)
HEMATOCRIT: 33.1 % — AB (ref 39.0–52.0)
Hemoglobin: 10.8 g/dL — ABNORMAL LOW (ref 13.0–17.0)
LYMPHS ABS: 1 10*3/uL (ref 0.7–4.0)
LYMPHS PCT: 14 %
MCH: 27.1 pg (ref 26.0–34.0)
MCHC: 32.6 g/dL (ref 30.0–36.0)
MCV: 83.2 fL (ref 78.0–100.0)
MONO ABS: 0.6 10*3/uL (ref 0.1–1.0)
Monocytes Relative: 8 %
NEUTROS ABS: 5.9 10*3/uL (ref 1.7–7.7)
Neutrophils Relative %: 78 %
Platelets: 164 10*3/uL (ref 150–400)
RBC: 3.98 MIL/uL — ABNORMAL LOW (ref 4.22–5.81)
RDW: 15.4 % (ref 11.5–15.5)
WBC: 7.5 10*3/uL (ref 4.0–10.5)

## 2015-07-08 LAB — URINE MICROSCOPIC-ADD ON

## 2015-07-08 LAB — CK: Total CK: 190 U/L (ref 49–397)

## 2015-07-08 LAB — DIGOXIN LEVEL: DIGOXIN LVL: 1.1 ng/mL (ref 0.8–2.0)

## 2015-07-08 MED ORDER — IOPAMIDOL (ISOVUE-370) INJECTION 76%
80.0000 mL | Freq: Once | INTRAVENOUS | Status: AC | PRN
  Administered 2015-07-08: 80 mL via INTRAVENOUS

## 2015-07-08 MED ORDER — TIZANIDINE HCL 2 MG PO TABS
2.0000 mg | ORAL_TABLET | Freq: Once | ORAL | Status: AC
  Administered 2015-07-08: 2 mg via ORAL
  Filled 2015-07-08 (×2): qty 1

## 2015-07-08 MED ORDER — FENTANYL CITRATE (PF) 100 MCG/2ML IJ SOLN
50.0000 ug | Freq: Once | INTRAMUSCULAR | Status: AC
  Administered 2015-07-08: 50 ug via INTRAVENOUS
  Filled 2015-07-08: qty 2

## 2015-07-08 MED ORDER — TIZANIDINE HCL 2 MG PO TABS: 2.0000 mg | ORAL_TABLET | Freq: Three times a day (TID) | ORAL | Status: DC | PRN

## 2015-07-08 NOTE — ED Notes (Signed)
Pt able to ambulate in hallway approx 571ft independently.

## 2015-07-08 NOTE — ED Notes (Signed)
Pt arrives from home with c/o diffuse neck and back pain, pt is tender to palpation. Pt also states that 2 days ago he had diffuse CP which had resolved once he woke up this morning. Pt denies trauma/injury. [facility] reports the family called out bc they were unable to get the pt out of bed.

## 2015-07-08 NOTE — ED Provider Notes (Signed)
CSN: [id number]     Arrival date & time 07/08/15  [id number] History   First MD Initiated Contact with Patient 07/08/15 [phone]     Chief Complaint  Patient presents with  . Neck Pain  . Back Pain     Patient is a 82 y.o. male presenting with neck pain and back pain. The history is provided by the patient and a relative. No language interpreter was used.  Neck Pain Back Pain  BIRD LAMARQUE is a 82 y.o. male who presents to the Emergency Department complaining of chest pain/back pain.  Pt reports three days of chest pain, bilateral shoulder pain, posterior and lateral neck pain, upper back pain.  Pain is constant in nature with no clear injuries.  No recent illness.  Pain is worse with movement.  His chest pain resolved today but his neck/back pain is persisting.  Hx is provided by the patient and his daughter.  He describes the neck pain as a crick in his neck. He denies fevers, cough, SOB, abdominal pain, vomiting, diarrhea. He has a hx/o afib and takes coumadin.  No prior similar sxs.    Past Medical History  Diagnosis Date  . Hypertension   . Diverticula of colon   . Atrial fibrillation ([place])   . Skin cancer of face   . Knee pain, right     "twisted it couple days ago; pain getting worse since" (02/25/2012)  . Arthritis     "right wrist and hand" (02/25/2012)  . Enlarged prostate   . Carotid artery occlusion    Past Surgical History  Procedure Laterality Date  . Colon resection  ~ 2010  . Inguinal hernia repair  ~ 2011    "left" (02/25/2012)  . Total hip arthroplasty  1980's    "right" (02/25/2012)  . Skin cancer excision      "q once in awhile; on my face" (02/25/2012)  . Carotid endarterectomy  12/02/2009    RIGHT  cea   Family History  Problem Relation Age of Onset  . Heart disease Father     Heart Disease before age 72  . Heart attack Father   . Healthy Mother   . Hypertension Brother   . Heart attack Brother   . Hypertension Brother   . Stroke Brother   . Heart  disease Brother   . Hypertension Brother   . Stroke Brother   . Heart disease Brother   . Cancer Sister    Social History  Substance Use Topics  . Smoking status: Former Smoker -- 1.00 packs/day for 33 years    Types: Cigarettes    Quit date: 03/16/1978  . Smokeless tobacco: Never Used     Comment: 02/25/2012 "quit smoking in 1980"  . Alcohol Use: No    Review of Systems  Musculoskeletal: Positive for back pain and neck pain.  All other systems reviewed and are negative.     Allergies  Review of patient's allergies indicates no known allergies.  Home Medications   Prior to Admission medications   Medication Sig Start Date End Date Taking? Authorizing Provider  amLODipine (NORVASC) 5 MG tablet Take 5 mg by mouth daily.   Yes Historical Provider, MD  atenolol (TENORMIN) 25 MG tablet Take 25 mg by mouth daily.   Yes Historical Provider, MD  colchicine 0.6 MG tablet Take 1 tablet (0.6 mg total) by mouth daily. 04/22/15  Yes Alfonzo Beers, MD  diclofenac sodium (VOLTAREN) 1 % GEL Apply 2 g topically 4 (  four) times daily. 05/08/15  Yes Janith Lima, MD  digoxin (LANOXIN) 0.125 MG tablet Take 1 tablet (0.125 mg total) by mouth daily. 04/25/15  Yes Dorothy Spark, MD  HYDROcodone-acetaminophen (NORCO/VICODIN) 5-325 MG tablet Take 1 tablet by mouth every 4 (four) hours as needed. 04/22/15  Yes Alfonzo Beers, MD  MULTIPLE VITAMINS PO Take   1 tablet daily   Yes Historical Provider, MD  Omega-3 Fatty Acids (FISH OIL) 1000 MG CAPS Take 1,000 mg by mouth daily.   Yes Historical Provider, MD  quinapril (ACCUPRIL) 10 MG tablet Take 10 mg by mouth daily.   Yes Historical Provider, MD  Tamsulosin HCl (FLOMAX) 0.4 MG CAPS Take 0.4 mg by mouth daily.   Yes Historical Provider, MD  warfarin (COUMADIN) 5 MG tablet Take as directed by coumadin clinic Patient taking differently: Take 5-7.5 mg by mouth as directed. Take 1.5 tablets all days except Monday take 1 tablet 04/25/15  Yes Jettie Booze,  MD  tiZANidine (ZANAFLEX) 2 MG tablet Take 1 tablet (2 mg total) by mouth every 8 (eight) hours as needed for muscle spasms. 07/08/15   Quintella Reichert, MD   BP 163/73 mmHg  Pulse 73  Temp(Src) 97.5 F (36.4 C) (Oral)  Resp 18  SpO2 96% Physical Exam  Constitutional: He appears well-developed and well-nourished.  HENT:  Head: Normocephalic and atraumatic.  Cardiovascular:  No murmur heard. Irregularly irregular   Pulmonary/Chest: Effort normal and breath sounds normal. No respiratory distress.  Abdominal: Soft. There is no tenderness. There is no rebound and no guarding.  Musculoskeletal: He exhibits no edema or tenderness.  Diffuse back, shoulder, posterior neck tenderness to palpation and pain with ROM.  2+ radial pulses bilaterally.   Neurological: He is alert.  Disoriented to time.  generalized weakness  Skin: Skin is warm and dry.  Psychiatric: He has a normal mood and affect. His behavior is normal.  Nursing note and vitals reviewed.   ED Course  Procedures (including critical care time) Labs Review Labs Reviewed  COMPREHENSIVE METABOLIC PANEL - Abnormal; Notable for the following:    CO2 21 (*)    Glucose, Bld 125 (*)    BUN 32 (*)    Creatinine, Ser 1.26 (*)    GFR calc non Af Amer 48 (*)    GFR calc Af Amer 56 (*)    All other components within normal limits  CBC WITH DIFFERENTIAL/PLATELET - Abnormal; Notable for the following:    RBC 3.98 (*)    Hemoglobin 10.8 (*)    HCT 33.1 (*)    All other components within normal limits  URINALYSIS, ROUTINE W REFLEX MICROSCOPIC (NOT AT [facility]) - Abnormal; Notable for the following:    APPearance CLOUDY (*)    Hgb urine dipstick MODERATE (*)    Protein, ur 100 (*)    Leukocytes, UA LARGE (*)    All other components within normal limits  PROTIME-INR - Abnormal; Notable for the following:    Prothrombin Time 31.6 (*)    INR 3.13 (*)    All other components within normal limits  URINE MICROSCOPIC-ADD ON - Abnormal; Notable  for the following:    Squamous Epithelial / LPF 0-5 (*)    Bacteria, UA RARE (*)    All other components within normal limits  I-STAT CHEM 8, ED - Abnormal; Notable for the following:    BUN 35 (*)    Glucose, Bld 123 (*)    Hemoglobin 12.6 (*)  HCT 37.0 (*)    All other components within normal limits  URINE CULTURE  DIGOXIN LEVEL  CK  I-STAT TROPOININ, ED  Randolm Idol, ED    Imaging Review Dg Chest Port 1 View  07/08/2015  CLINICAL DATA:  Neck pain, back pain, chest pain EXAM: PORTABLE CHEST 1 VIEW COMPARISON:  02/24/2012 FINDINGS: Cardiomegaly again noted. No pulmonary edema. There is trace left pleural effusion with left basilar atelectasis or infiltrate. Degenerative changes thoracic spine. IMPRESSION: No pulmonary edema. Trace left pleural effusion with left basilar atelectasis or infiltrate. Electronically Signed   By: Lahoma Crocker M.D.   On: 07/08/2015 10:31   Ct Angio Chest/abd/pel For Dissection W And/or W/wo  07/08/2015  CLINICAL DATA:  Posterior neck and back pain. Chest pain 2 days ago which has resolved. EXAM: CT ANGIOGRAPHY CHEST, ABDOMEN AND PELVIS TECHNIQUE: Multidetector CT imaging through the chest, abdomen and pelvis was performed using the standard protocol during bolus administration of intravenous contrast. Multiplanar reconstructed images and MIPs were obtained and reviewed to evaluate the vascular anatomy. CONTRAST:  80 cc Isovue 370 IV COMPARISON:  Chest CT 08/05/2005. FINDINGS: CTA CHEST FINDINGS Mediastinum/Nodes: Cardiomegaly. Diffuse coronary artery calcifications in the left anterior descending and right coronary arteries. Diffuse aortic calcifications with mild ectasia. Maximum diameter in the ascending aorta measures 3.6 cm. No aneurysm or dissection. Atherosclerotic irregularity in the descending thoracic aorta. No mediastinal, hilar, or axillary adenopathy. Lungs/Pleura: Mild to moderate emphysema. Dependent atelectasis in the posterior lower lobes  bilaterally. No confluent opacities or pleural effusions. Musculoskeletal: No acute bony abnormality or focal bone lesion. Review of the MIP images confirms the above findings. CTA ABDOMEN AND PELVIS FINDINGS Hepatobiliary: No focal hepatic abnormality. Gallbladder unremarkable. Pancreas: No focal abnormality. Spleen: No focal abnormality. Adrenals/Urinary Tract: No focal abnormality or hydronephrosis. Urinary bladder and adrenal glands unremarkable. Stomach/Bowel: Appendix is normal. Scattered descending colonic and sigmoid diverticulosis. No active diverticulitis. Postsurgical changes in the sigmoid colon. Small midline ventral hernia containing the anterior wall of the transverse colon. No evidence of bowel obstruction. Moderate stool burden throughout the colon. Stomach and small bowel are decompressed. Vascular/Lymphatic: Diffuse atherosclerotic disease throughout the aorta. Extensive mural plaque. Early aneurysmal dilatation of the the distal abdominal aorta, 3.2 cm. Focal dissection noted in the right common iliac artery. Heavy atherosclerotic disease within the iliac vessels. 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 Smaller 1.7 cm right internal iliac artery aneurysm, also thrombosed. Reproductive: No visible abnormality. Other: No free fluid or free air. Musculoskeletal: Changes of right hip replacement. Degenerative changes in the lumbar spine and left hip. No acute bony abnormality. Review of the MIP images confirms the above findings. IMPRESSION: No evidence of aortic dissection. Diffuse atherosclerotic disease throughout the thoracic and abdominal aorta. Mild aneurysmal dilatation of the distal abdominal aorta, 3.2 cm. Short segment focal dissection within the right common iliac artery which does not appear to be flow limiting. 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 Smaller 1.7 cm right internal iliac artery aneurysm  which is also thrombosed. Mild COPD.  Cardiomegaly.  Coronary artery disease. Electronically Signed   By: Rolm Baptise M.D.   On: 07/08/2015 11:43   I have personally reviewed and evaluated these images and lab results as part of my medical decision-making.   EKG Interpretation   Date/Time:  Monday July 08 2015 09:38:11 EDT Ventricular Rate:  89 PR Interval:    QRS Duration: 99 QT Interval:  367 QTC Calculation: 446 R Axis:   -27 Text Interpretation:  Atrial fibrillation Ventricular premature complex  Borderline left axis deviation Probable anteroseptal infarct, old Minimal  ST depression, lateral leads Confirmed by Hazle Coca [phone]) on 07/08/2015  9:39:45 AM      MDM   Final diagnoses:  Posterior neck pain  Bilateral thoracic back pain    Pt here for evaluation of neck pain/back pain as well as chest pain yesterday (now resolved).  Patient is a vague historian and it is difficult to get descriptions of his symptoms. Given his migratory pain a dissection protocol was obtained that was negative for acute dissection. There were incidental findings of iliac artery aneurysms. Discussed vascular surgery follow-up. His pain is improved on repeat evaluation following pain medication. He is requesting to go home on recheck. His presentation is not consistent with ACS or PE. Discussed close outpatient follow-up and home care. Per patient he recently had a negative stress test in the last month at the [place] - records are not available.   Quintella Reichert, MD 07/08/15 [phone]

## 2015-07-08 NOTE — ED Notes (Signed)
Daughter [name], next of kin, [phone]

## 2015-07-08 NOTE — Discharge Instructions (Signed)
Your coumadin level was elevated at 3.13 today.  Do not take your coumadin tonight and follow up with your coumadin doctor for additional adjustments.  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  Get rechecked immediately if you develop any new or worrisome symptoms.     Back Pain, Adult Back pain is very common in adults.The cause of back pain is rarely dangerous and the pain often gets better over time.The cause of your back pain may not be known. Some common causes of back pain include:  Strain of the muscles or ligaments supporting the spine.  Wear and tear (degeneration) of the spinal disks.  Arthritis.  Direct injury to the back. For many people, back pain may return. Since back pain is rarely dangerous, most people can learn to manage this condition on their own. HOME CARE INSTRUCTIONS Watch your back pain for any changes. The following actions may help to lessen any discomfort you are feeling:  Remain active. It is stressful on your back to sit or stand in one place for long periods of time. Do not sit, drive, or stand in one place for more than 30 minutes at a time. Take short walks on even surfaces as soon as you are able.Try to increase the length of time you walk each day.  Exercise regularly as directed by your health care provider. Exercise helps your back heal faster. It also helps avoid future injury by keeping your muscles strong and flexible.  Do not stay in bed.Resting more than 1-2 days can delay your recovery.  Pay attention to your body when you bend and lift. The most comfortable positions are those that put less stress on your recovering back. Always use proper lifting techniques, including:  Bending your knees.  Keeping the load close to your body.  Avoiding twisting.  Find a comfortable position to sleep. Use a firm mattress and lie on your side  with your knees slightly bent. If you lie on your back, put a pillow under your knees.  Avoid feeling anxious or stressed.Stress increases muscle tension and can worsen back pain.It is important to recognize when you are anxious or stressed and learn ways to manage it, such as with exercise.  Take medicines only as directed by your health care provider. Over-the-counter medicines to reduce pain and inflammation are often the most helpful.Your health care provider may prescribe muscle relaxant drugs.These medicines help dull your pain so you can more quickly return to your normal activities and healthy exercise.  Apply ice to the injured area:  Put ice in a plastic bag.  Place a towel between your skin and the bag.  Leave the ice on for 20 minutes, 2-3 times a day for the first 2-3 days. After that, ice and heat may be alternated to reduce pain and spasms.  Maintain a healthy weight. Excess weight puts extra stress on your back and makes it difficult to maintain good posture. SEEK MEDICAL CARE IF:  You have pain that is not relieved with rest or medicine.  You have increasing pain going down into the legs or buttocks.  You have pain that does not improve in one week.  You have night pain.  You lose weight.  You have a fever or chills. SEEK IMMEDIATE MEDICAL CARE IF:   You develop new bowel or bladder control problems.  You have unusual weakness  or numbness in your arms or legs.  You develop nausea or vomiting.  You develop abdominal pain.  You feel faint.   This information is not intended to replace advice given to you by your health care provider. Make sure you discuss any questions you have with your health care provider.   Document Released: 03/02/2005 Document Revised: 03/23/2014 Document Reviewed: 07/04/2013 Elsevier Interactive Patient Education 2016 [employer] Abdominal Aortic Aneurysm An aneurysm is a weakened or damaged part of an artery wall that bulges  from the normal force of blood pumping through the body. An abdominal aortic aneurysm is an aneurysm that occurs in the lower part of the aorta, the main artery of the body.  The major concern with an abdominal aortic aneurysm is that it can enlarge and burst (rupture) or blood can flow between the layers of the wall of the aorta through a tear (aorticdissection). Both of these conditions can cause bleeding inside the body and can be life threatening unless diagnosed and treated promptly. CAUSES  The exact cause of an abdominal aortic aneurysm is unknown. Some contributing factors are:   A hardening of the arteries caused by the buildup of fat and other substances in the lining of a blood vessel (arteriosclerosis).  Inflammation of the walls of an artery (arteritis).   Connective tissue diseases, such as Marfan syndrome.   Abdominal trauma.   An infection, such as syphilis or staphylococcus, in the wall of the aorta (infectious aortitis) caused by bacteria. RISK FACTORS  Risk factors that contribute to an abdominal aortic aneurysm may include:  Age older than 41 years.   High blood pressure (hypertension).  Male gender.  Ethnicity (white race).  Obesity.  Family history of aneurysm (first degree relatives only).  Tobacco use. PREVENTION  The following healthy lifestyle habits may help decrease your risk of abdominal aortic aneurysm:  Quitting smoking. Smoking can raise your blood pressure and cause arteriosclerosis.  Limiting or avoiding alcohol.  Keeping your blood pressure, blood sugar level, and cholesterol levels within normal limits.  Decreasing your salt intake. In somepeople, too much salt can raise blood pressure and increase your risk of abdominal aortic aneurysm.  Eating a diet low in saturated fats and cholesterol.  Increasing your fiber intake by including whole grains, vegetables, and fruits in your diet. Eating these foods may help lower blood  pressure.  Maintaining a healthy weight.  Staying physically active and exercising regularly. SYMPTOMS  The symptoms of abdominal aortic aneurysm may vary depending on the size and rate of growth of the aneurysm.Most grow slowly and do not have any symptoms. When symptoms do occur, they may include:  Pain (abdomen, side, lower back, or groin). The pain may vary in intensity. A sudden onset of severe pain may indicate that the aneurysm has ruptured.  Feeling full after eating only small amounts of food.  Nausea or vomiting or both.  Feeling a pulsating lump in the abdomen.  Feeling faint or passing out. DIAGNOSIS  Since most unruptured abdominal aortic aneurysms have no symptoms, they are often discovered during diagnostic exams for other conditions. An aneurysm may be found during the following procedures:  Ultrasonography (A one-time screening for abdominal aortic aneurysm by ultrasonography is also recommended for all men aged 45-75 years who have ever smoked).  X-ray exams.  A computed tomography (CT).  Magnetic resonance imaging (MRI).  Angiography or arteriography. TREATMENT  Treatment of an abdominal aortic aneurysm depends on the size of your aneurysm, your age,  and risk factors for rupture. Medication to control blood pressure and pain may be used to manage aneurysms smaller than 6 cm. Regular monitoring for enlargement may be recommended by your caregiver if:  The aneurysm is 3-4 cm in size (an annual ultrasonography may be recommended).  The aneurysm is 4-4.5 cm in size (an ultrasonography every 6 months may be recommended).  The aneurysm is larger than 4.5 cm in size (your caregiver may ask that you be examined by a vascular surgeon). If your aneurysm is larger than 6 cm, surgical repair may be recommended. There are two main methods for repair of an aneurysm:   Endovascular repair (a minimally invasive surgery). This is done most often.  Open repair. This method  is used if an endovascular repair is not possible.   This information is not intended to replace advice given to you by your health care provider. Make sure you discuss any questions you have with your health care provider.   Document Released: 12/10/2004 Document Revised: 06/27/2012 Document Reviewed: 04/01/2012 Elsevier Interactive Patient Education [employer].

## 2015-07-08 NOTE — Discharge Planning (Addendum)
Received call for consult to set up home health care.

## 2015-07-10 LAB — URINE CULTURE

## 2015-07-11 ENCOUNTER — Ambulatory Visit (INDEPENDENT_AMBULATORY_CARE_PROVIDER_SITE_OTHER): Payer: Medicare HMO | Admitting: Internal Medicine

## 2015-07-11 ENCOUNTER — Encounter: Payer: Self-pay | Admitting: Internal Medicine

## 2015-07-11 ENCOUNTER — Other Ambulatory Visit: Payer: Self-pay | Admitting: Internal Medicine

## 2015-07-11 VITALS — BP 110/70 | HR 71 | Temp 97.7°F | Resp 16 | Ht 71.0 in | Wt 170.0 lb

## 2015-07-11 DIAGNOSIS — I729 Aneurysm of unspecified site: Secondary | ICD-10-CM

## 2015-07-11 DIAGNOSIS — N183 Chronic kidney disease, stage 3 unspecified: Secondary | ICD-10-CM

## 2015-07-11 DIAGNOSIS — R51 Headache: Secondary | ICD-10-CM

## 2015-07-11 DIAGNOSIS — R270 Ataxia, unspecified: Secondary | ICD-10-CM | POA: Insufficient documentation

## 2015-07-11 DIAGNOSIS — R519 Headache, unspecified: Secondary | ICD-10-CM

## 2015-07-11 DIAGNOSIS — I10 Essential (primary) hypertension: Secondary | ICD-10-CM | POA: Diagnosis not present

## 2015-07-11 NOTE — Patient Instructions (Signed)

## 2015-07-11 NOTE — Progress Notes (Signed)
Pre visit review using our clinic review tool, if applicable. No additional management support is needed unless otherwise documented below in the visit note. 

## 2015-07-11 NOTE — Progress Notes (Signed)
Subjective:  Patient ID: [name], male    DOB: 03/13/1924  Age: 82 y.o. MRN: [id number]  CC: Headache   HPI [name] presents for A one-week history of headache, he also had neck, chest and back pain and was seen in emergency room in his chest and abdomen were scanned. He was found to have some abdominal aneurysms but the chest and lungs were unremarkable. He complains of a diffuse, bilateral headache that starts in the frontal region and radiates to the top of his head and down into his neck. He gets symptom relief with Tylenol. He was prescribed a muscle relaxer and hydrocodone but says they do not help. He and his daughter are concerned that his brain was not scanned. There concerned that if he has aneurysms in his abdomen he may also have an aneurysm in his brain. He denies nausea, vomiting, changes in his vision or hearing, or paresthesias.  Outpatient Prescriptions Prior to Visit  Medication Sig Dispense Refill  . amLODipine (NORVASC) 5 MG tablet Take 5 mg by mouth daily.    Marland Kitchen atenolol (TENORMIN) 25 MG tablet Take 25 mg by mouth daily.    . colchicine 0.6 MG tablet Take 1 tablet (0.6 mg total) by mouth daily. 30 tablet 0  . diclofenac sodium (VOLTAREN) 1 % GEL Apply 2 g topically 4 (four) times daily. 100 g 11  . digoxin (LANOXIN) 0.125 MG tablet Take 1 tablet (0.125 mg total) by mouth daily. 90 tablet 3  . MULTIPLE VITAMINS PO Take   1 tablet daily    . Omega-3 Fatty Acids (FISH OIL) 1000 MG CAPS Take 1,000 mg by mouth daily.    . quinapril (ACCUPRIL) 10 MG tablet Take 10 mg by mouth daily.    . Tamsulosin HCl (FLOMAX) 0.4 MG CAPS Take 0.4 mg by mouth daily.    Marland Kitchen warfarin (COUMADIN) 5 MG tablet Take as directed by coumadin clinic (Patient taking differently: Take 5-7.5 mg by mouth as directed. Take 1.5 tablets all days except Monday take 1 tablet) 150 tablet 0  . HYDROcodone-acetaminophen (NORCO/VICODIN) 5-325 MG tablet Take 1 tablet by mouth every 4 (four) hours as needed.  6 tablet 0  . tiZANidine (ZANAFLEX) 2 MG tablet Take 1 tablet (2 mg total) by mouth every 8 (eight) hours as needed for muscle spasms. (Patient not taking: Reported on 07/11/2015) 9 tablet 0   No facility-administered medications prior to visit.    ROS Review of Systems  Constitutional: Negative.   Eyes: Negative.  Negative for photophobia and visual disturbance.  Respiratory: Negative.  Negative for cough, choking, chest tightness, shortness of breath and stridor.   Cardiovascular: Negative.  Negative for chest pain, palpitations and leg swelling.  Gastrointestinal: Negative.  Negative for nausea, vomiting, abdominal pain, diarrhea and constipation.  Endocrine: Negative.   Genitourinary: Negative.  Negative for difficulty urinating.  Musculoskeletal: Positive for neck pain. Negative for back pain, gait problem and neck stiffness.  Skin: Negative.   Allergic/Immunologic: Negative.   Neurological: Positive for headaches. Negative for dizziness.  Hematological: Negative.  Negative for adenopathy. Does not bruise/bleed easily.  Psychiatric/Behavioral: Negative.     Objective:  BP 110/70 mmHg  Pulse 71  Temp(Src) 97.7 F (36.5 C) (Oral)  Resp 16  Ht 5\' 11"  (1.803 m)  Wt 170 lb (77.111 kg)  BMI 23.72 kg/m2  SpO2 97%  BP Readings from Last 3 Encounters:  07/11/15 110/70  07/08/15 163/73  05/08/15 124/70    Wt Readings  from Last 3 Encounters:  07/11/15 170 lb (77.111 kg)  05/08/15 168 lb (76.204 kg)  05/06/15 169 lb (76.658 kg)    Physical Exam  Constitutional: He is oriented to person, place, and time. He appears well-developed and well-nourished. No distress.  HENT:  Head: Normocephalic and atraumatic.  Mouth/Throat: Oropharynx is clear and moist. No oropharyngeal exudate.  Eyes: Conjunctivae and EOM are normal. Pupils are equal, round, and reactive to light. Right eye exhibits no discharge. Left eye exhibits no discharge. No scleral icterus.  Neck: Normal range of  motion. Neck supple. No JVD present. No tracheal deviation present. No thyromegaly present.  Cardiovascular: Normal rate, regular rhythm, normal heart sounds and intact distal pulses.  Exam reveals no gallop and no friction rub.   No murmur heard. Pulmonary/Chest: Effort normal and breath sounds normal. No stridor. No respiratory distress. He has no wheezes. He has no rales. He exhibits no tenderness.  Abdominal: Soft. Bowel sounds are normal. He exhibits no distension and no mass. There is no tenderness. There is no rebound and no guarding.  Musculoskeletal: Normal range of motion. He exhibits no edema or tenderness.  Lymphadenopathy:    He has no cervical adenopathy.  Neurological: He is alert and oriented to person, place, and time. He displays no atrophy and normal reflexes. No cranial nerve deficit or sensory deficit. He exhibits normal muscle tone. He displays no seizure activity. Coordination and gait abnormal. He displays no Babinski's sign on the right side. He displays no Babinski's sign on the left side.  Reflex Scores:      Tricep reflexes are 0 on the right side and 0 on the left side.      Bicep reflexes are 0 on the right side and 0 on the left side.      Brachioradialis reflexes are 0 on the right side and 0 on the left side.      Patellar reflexes are 1+ on the right side and 1+ on the left side.      Achilles reflexes are 0 on the right side and 0 on the left side. He is mildly ataxic  Skin: Skin is warm and dry. No rash noted. He is not diaphoretic. No erythema. No pallor.  Psychiatric: He has a normal mood and affect. His behavior is normal. Judgment and thought content normal.  Vitals reviewed.   Lab Results  Component Value Date   WBC 7.5 07/08/2015   HGB 10.8* 07/08/2015   HCT 33.1* 07/08/2015   PLT 164 07/08/2015   GLUCOSE 125* 07/08/2015   ALT 18 07/08/2015   AST 25 07/08/2015   NA 140 07/08/2015   K 4.5 07/08/2015   CL 109 07/08/2015   CREATININE 1.26*  07/08/2015   BUN 32* 07/08/2015   CO2 21* 07/08/2015   INR 3.13* 07/08/2015    Dg Chest Port 1 View  07/08/2015  CLINICAL DATA:  Neck pain, back pain, chest pain EXAM: PORTABLE CHEST 1 VIEW COMPARISON:  02/24/2012 FINDINGS: Cardiomegaly again noted. No pulmonary edema. There is trace left pleural effusion with left basilar atelectasis or infiltrate. Degenerative changes thoracic spine. IMPRESSION: No pulmonary edema. Trace left pleural effusion with left basilar atelectasis or infiltrate. Electronically Signed   By: Lahoma Crocker M.D.   On: 07/08/2015 10:31   Ct Angio Chest/abd/pel For Dissection W And/or W/wo  07/08/2015  CLINICAL DATA:  Posterior neck and back pain. Chest pain 2 days ago which has resolved. EXAM: CT ANGIOGRAPHY CHEST, ABDOMEN AND PELVIS TECHNIQUE:  Multidetector CT imaging through the chest, abdomen and pelvis was performed using the standard protocol during bolus administration of intravenous contrast. Multiplanar reconstructed images and MIPs were obtained and reviewed to evaluate the vascular anatomy. CONTRAST:  80 cc Isovue 370 IV COMPARISON:  Chest CT 08/05/2005. FINDINGS: CTA CHEST FINDINGS Mediastinum/Nodes: Cardiomegaly. Diffuse coronary artery calcifications in the left anterior descending and right coronary arteries. Diffuse aortic calcifications with mild ectasia. Maximum diameter in the ascending aorta measures 3.6 cm. No aneurysm or dissection. Atherosclerotic irregularity in the descending thoracic aorta. No mediastinal, hilar, or axillary adenopathy. Lungs/Pleura: Mild to moderate emphysema. Dependent atelectasis in the posterior lower lobes bilaterally. No confluent opacities or pleural effusions. Musculoskeletal: No acute bony abnormality or focal bone lesion. Review of the MIP images confirms the above findings. CTA ABDOMEN AND PELVIS FINDINGS Hepatobiliary: No focal hepatic abnormality. Gallbladder unremarkable. Pancreas: No focal abnormality. Spleen: No focal abnormality.  Adrenals/Urinary Tract: No focal abnormality or hydronephrosis. Urinary bladder and adrenal glands unremarkable. Stomach/Bowel: Appendix is normal. Scattered descending colonic and sigmoid diverticulosis. No active diverticulitis. Postsurgical changes in the sigmoid colon. Small midline ventral hernia containing the anterior wall of the transverse colon. No evidence of bowel obstruction. Moderate stool burden throughout the colon. Stomach and small bowel are decompressed. Vascular/Lymphatic: Diffuse atherosclerotic disease throughout the aorta. Extensive mural plaque. Early aneurysmal dilatation of the the distal abdominal aorta, 3.2 cm. Focal dissection noted in the right common iliac artery. Heavy atherosclerotic disease within the iliac vessels. 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 Smaller 1.7 cm right internal iliac artery aneurysm, also thrombosed. Reproductive: No visible abnormality. Other: No free fluid or free air. Musculoskeletal: Changes of right hip replacement. Degenerative changes in the lumbar spine and left hip. No acute bony abnormality. Review of the MIP images confirms the above findings. IMPRESSION: No evidence of aortic dissection. Diffuse atherosclerotic disease throughout the thoracic and abdominal aorta. Mild aneurysmal dilatation of the distal abdominal aorta, 3.2 cm. Short segment focal dissection within the right common iliac artery which does not appear to be flow limiting. 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 Smaller 1.7 cm right internal iliac artery aneurysm which is also thrombosed. Mild COPD.  Cardiomegaly.  Coronary artery disease. Electronically Signed   By: Rolm Baptise M.D.   On: 07/08/2015 11:43    Assessment & Plan:   Damond was seen today for headache.  Diagnoses and all orders for this visit:  Kidney disease, chronic, stage III (GFR 30-59 ml/min)- his renal function is stable, he  will avoid nephrotoxic agents  Essential hypertension- his blood pressures adequately well-controlled, electrolytes and renal function are stable.  Acute nonintractable headache, unspecified headache type- I will scan his brain for an MRI to see if there is a mass, aneurysm, bleed, or tumor -     MR Brain Wo Contrast; Future -     MR Angiogram Head Wo Contrast; Future  Ataxia- as above -     MR Brain Wo Contrast; Future -     MR Angiogram Head Wo Contrast; Future  Aneurysm ([place])- as above -     MR Brain Wo Contrast; Future -     MR Angiogram Head Wo Contrast; Future   I have discontinued Mr. Colclasure's HYDROcodone-acetaminophen and tiZANidine. I am also having him maintain his tamsulosin, Fish Oil, MULTIPLE VITAMINS PO, amLODipine, atenolol, quinapril, colchicine, warfarin, digoxin, and diclofenac sodium.  No orders of the defined types were  placed in this encounter.     Follow-up: Return in about 3 weeks (around 08/01/2015).  Scarlette Calico, MD

## 2015-07-12 ENCOUNTER — Encounter: Payer: Self-pay | Admitting: Vascular Surgery

## 2015-07-15 ENCOUNTER — Telehealth: Payer: Self-pay

## 2015-07-15 ENCOUNTER — Encounter: Payer: Medicare HMO | Admitting: Surgery

## 2015-07-15 NOTE — Telephone Encounter (Signed)
MD is out of office today will return wed will forward for his return...Johny Chess

## 2015-07-15 NOTE — Telephone Encounter (Signed)
Need orders for PT and OT for patient. They are will [facility] Orders: PT - Twice a week for 8weeks OT- one visit

## 2015-07-16 NOTE — Telephone Encounter (Signed)
lmovm

## 2015-07-17 ENCOUNTER — Ambulatory Visit (INDEPENDENT_AMBULATORY_CARE_PROVIDER_SITE_OTHER): Payer: Medicare HMO | Admitting: Vascular Surgery

## 2015-07-17 ENCOUNTER — Telehealth: Payer: Self-pay

## 2015-07-17 ENCOUNTER — Encounter: Payer: Self-pay | Admitting: Vascular Surgery

## 2015-07-17 VITALS — BP 131/70 | HR 71 | Ht 71.0 in | Wt 171.2 lb

## 2015-07-17 DIAGNOSIS — I714 Abdominal aortic aneurysm, without rupture, unspecified: Secondary | ICD-10-CM

## 2015-07-17 NOTE — Progress Notes (Signed)
Vascular and Vein Specialist of [facility]  Patient name: [name] MRN: [id number] DOB: 01-08-1924 Sex: male  REASON FOR CONSULT: Iliac artery aneurysm. Referred by the emergency department.  HPI: [name] is a 82 y.o. male, who was apparently having some chest pain. This prompted a CT scan of the chest abdomen and pelvis. This did not show any evidence of dissection. However, an incidental finding was bilateral internal iliac artery aneurysms. For this reason the patient was referred for vascular consultation.  The patient has had no further chest pain. Patient denies any abdominal pain or back pain. He denies any postprandial abdominal pain. He denies any pelvic pain  This patient has undergone previous right carotid endarterectomy in September 2011 by Dr. Curt Jews. He was last seen by Dr. Donnetta Hutching in May 2012. He was lost to follow up and has not been on our carotid protocol.  Past Medical History  Diagnosis Date  . Hypertension   . Diverticula of colon   . Atrial fibrillation ([place])   . Skin cancer of face   . Knee pain, right     "twisted it couple days ago; pain getting worse since" (02/25/2012)  . Arthritis     "right wrist and hand" (02/25/2012)  . Enlarged prostate   . Carotid artery occlusion     Family History  Problem Relation Age of Onset  . Heart disease Father     Heart Disease before age 49  . Heart attack Father   . Healthy Mother   . Hypertension Brother   . Heart attack Brother   . Hypertension Brother   . Stroke Brother   . Heart disease Brother   . Hypertension Brother   . Stroke Brother   . Heart disease Brother   . Cancer Sister     SOCIAL HISTORY: Social History   Social History  . Marital Status: Widowed    Spouse Name: N/A  . Number of Children: N/A  . Years of Education: N/A   Occupational History  . Not on file.   Social History Main Topics  . Smoking status: Former Smoker -- 1.00 packs/day for 33 years    Types:  Cigarettes    Quit date: 03/16/1978  . Smokeless tobacco: Never Used     Comment: 02/25/2012 "quit smoking in 1980"  . Alcohol Use: No  . Drug Use: No  . Sexual Activity: Not Currently   Other Topics Concern  . Not on file   Social History Narrative    No Known Allergies  Current Outpatient Prescriptions  Medication Sig Dispense Refill  . amLODipine (NORVASC) 5 MG tablet Take 5 mg by mouth daily.    Marland Kitchen atenolol (TENORMIN) 25 MG tablet Take 25 mg by mouth daily.    . colchicine 0.6 MG tablet Take 1 tablet (0.6 mg total) by mouth daily. 30 tablet 0  . diclofenac sodium (VOLTAREN) 1 % GEL Apply 2 g topically 4 (four) times daily. 100 g 11  . digoxin (LANOXIN) 0.125 MG tablet Take 1 tablet (0.125 mg total) by mouth daily. 90 tablet 3  . MULTIPLE VITAMINS PO Take   1 tablet daily    . Omega-3 Fatty Acids (FISH OIL) 1000 MG CAPS Take 1,000 mg by mouth daily.    . quinapril (ACCUPRIL) 10 MG tablet Take 10 mg by mouth daily.    . Tamsulosin HCl (FLOMAX) 0.4 MG CAPS Take 0.4 mg by mouth daily.    Marland Kitchen warfarin (COUMADIN) 5 MG  tablet Take as directed by coumadin clinic (Patient taking differently: Take 5-7.5 mg by mouth as directed. Take 1.5 tablets all days except Monday take 1 tablet) 150 tablet 0   No current facility-administered medications for this visit.    REVIEW OF SYSTEMS:  [X]  denotes positive finding, [ ]  denotes negative finding Cardiac  Comments:  Chest pain or chest pressure:    Shortness of breath upon exertion:    Short of breath when lying flat:    Irregular heart rhythm:        Vascular    Pain in calf, thigh, or hip brought on by ambulation:    Pain in feet at night that wakes you up from your sleep:     Blood clot in your veins:    Leg swelling:         Pulmonary    Oxygen at home:    Productive cough:     Wheezing:         Neurologic    Sudden weakness in arms or legs:     Sudden numbness in arms or legs:     Sudden onset of difficulty speaking or slurred  speech:    Temporary loss of vision in one eye:     Problems with dizziness:         Gastrointestinal    Blood in stool:     Vomited blood:         Genitourinary    Burning when urinating:     Blood in urine:        Psychiatric    Major depression:         Hematologic    Bleeding problems:    Problems with blood clotting too easily:        Skin    Rashes or ulcers:        Constitutional    Fever or chills:      PHYSICAL EXAM: Filed Vitals:   07/17/15 1434  BP: 131/70  Pulse: 71  Height: 5\' 11"  (1.803 m)  Weight: 171 lb 3.2 oz (77.656 kg)  SpO2: 99%    GENERAL: The patient is a well-nourished male, in no acute distress. The vital signs are documented above. CARDIAC: There is a regular rate and rhythm.  VASCULAR: I do not detect carotid bruits. He has palpable femoral pulses. I cannot palpate pedal pulses. Both feet appear adequately perfused. He has mild bilateral lower extremity swelling. PULMONARY: There is good air exchange bilaterally without wheezing or rales. ABDOMEN: Soft and non-tender with normal pitched bowel sounds.  MUSCULOSKELETAL: There are no major deformities or cyanosis. NEUROLOGIC: No focal weakness or paresthesias are detected. SKIN: There are no ulcers or rashes noted. PSYCHIATRIC: The patient has a normal affect.  DATA:   CT ANGIOGRAM ABDOMEN PELVIS:  I have reviewed the CT scan which shows diffuse atherosclerotic disease throughout the thoracic and abdominal aorta. The distal abdominal aorta measures 3.2 cm in maximum diameter. There is a 3.8 cm left internal iliac artery aneurysm which appears thrombosed. There is a small 1.7 cm right internal iliac artery aneurysm which is also thrombosed.  MEDICAL ISSUES:  3.8 CM THROMBOSED LEFT INTERNAL ILIAC ARTERY ANEURYSM: This patient has a 3.8 cm left internal iliac artery aneurysm. However this is thrombosed and no treatment is needed for this reason. He has a small 1.7 cm right internal iliac  artery aneurysm which is also thrombosed. His distal abdominal aorta measures 3.2 cm in maximum diameter and thus  would qualify as an aneurysm. For this reason I have recommended an ultrasound study in 18 months and myself or Dr. Donnetta Hutching will see him back at that time. He is not a smoker.   STATUS POST RIGHT CAROTID ENDARTERECTOMY: This patient underwent a right carotid endarterectomy 2011 but then was lost to follow up. I have ordered a carotid duplex scan and an office visit with our nurse practitioner. Given his age, if his duplex looks normal that he may not need yearly studies but perhaps a follow up study in 2-3 years.    Deitra Mayo [facility] [place]: [phone]

## 2015-07-17 NOTE — Telephone Encounter (Signed)
Spoke with [facility] informed that orders are ok.

## 2015-07-17 NOTE — Telephone Encounter (Signed)
Verbal given 

## 2015-07-18 ENCOUNTER — Ambulatory Visit: Payer: Medicare HMO | Admitting: Internal Medicine

## 2015-07-19 ENCOUNTER — Encounter: Payer: Self-pay | Admitting: Family

## 2015-07-25 ENCOUNTER — Other Ambulatory Visit: Payer: Self-pay | Admitting: Vascular Surgery

## 2015-07-25 ENCOUNTER — Ambulatory Visit (HOSPITAL_COMMUNITY)
Admission: RE | Admit: 2015-07-25 | Discharge: 2015-07-25 | Disposition: A | Discharge status: Home / Self Care | Payer: Medicare HMO | Source: Ambulatory Visit | Attending: Family | Admitting: Family

## 2015-07-25 ENCOUNTER — Ambulatory Visit (INDEPENDENT_AMBULATORY_CARE_PROVIDER_SITE_OTHER): Payer: Medicare HMO | Admitting: Family

## 2015-07-25 ENCOUNTER — Encounter: Payer: Self-pay | Admitting: Family

## 2015-07-25 ENCOUNTER — Encounter: Payer: Self-pay | Admitting: Internal Medicine

## 2015-07-25 VITALS — BP 120/67 | HR 68 | Temp 97.1°F | Ht 71.0 in | Wt 174.2 lb

## 2015-07-25 DIAGNOSIS — I6529 Occlusion and stenosis of unspecified carotid artery: Secondary | ICD-10-CM

## 2015-07-25 DIAGNOSIS — I714 Abdominal aortic aneurysm, without rupture, unspecified: Secondary | ICD-10-CM

## 2015-07-25 DIAGNOSIS — I6521 Occlusion and stenosis of right carotid artery: Secondary | ICD-10-CM

## 2015-07-25 DIAGNOSIS — I10 Essential (primary) hypertension: Secondary | ICD-10-CM | POA: Diagnosis not present

## 2015-07-25 NOTE — Progress Notes (Signed)
Chief Complaint: Follow up Extracranial Carotid Artery Stenosis   History of Present Illness  [name] is a 82 y.o. male patient of Dr. Scot Dock who was apparently having some chest pain. This prompted a CT scan of the chest abdomen and pelvis. This did not show any evidence of dissection. However, an incidental finding was bilateral internal iliac artery aneurysms. For this reason the patient was referred for vascular consultation.  Dr. Scot Dock last saw pt on 07/17/15. At that time the patient had a 3.8 cm left internal iliac artery aneurysm. However this is thrombosed and no treatment is needed for this reason. He has a small 1.7 cm right internal iliac artery aneurysm which is also thrombosed. His distal abdominal aorta measures 3.2 cm in maximum diameter and thus would qualify as an aneurysm. For this reason Dr. Scot Dock recommended an ultrasound study in 18 months and Dr. Scot Dock or Dr. Donnetta Hutching will see him back at that time. He is not a smoker.   This patient underwent a right carotid endarterectomy in 2011 but then was lost to follow up. Dr. Scot Dock ordered a carotid duplex scan and an office visit with our nurse practitioner. Given his age, if his duplex looks normal that he may not need yearly studies but perhaps a follow up study in 2-3 years.  The patient has had no further chest pain. Patient denies any abdominal pain or back pain. He denies any postprandial abdominal pain. He denies any pelvic pain  He is currently receiving home physical therapy to improve his shuffling gait. He states "I have a bad right hip". He wants no more surgery in his right hip. His daughter states he has fallen a few times.   The patient denies any history of TIA or stroke symptoms, specifically the patient denies a history of amaurosis fugax or monocular blindness, denies a history unilateral  of facial drooping, denies a history of hemiplegia, and denies a history of receptive or expressive aphasia.     The patient denies New Medical or Surgical History. Pt denies chest pain or dyspnea.  Pt Diabetic: no Pt smoker: former smoker, quit in 1980, smoked 1 ppd x 33 years   Pt meds include: Statin : no ASA: no Other anticoagulants/antiplatelets: warfarin, has atrial fib   Past Medical History  Diagnosis Date  . Hypertension   . Diverticula of colon   . Atrial fibrillation ([place])   . Skin cancer of face   . Knee pain, right     "twisted it couple days ago; pain getting worse since" (02/25/2012)  . Arthritis     "right wrist and hand" (02/25/2012)  . Enlarged prostate   . Carotid artery occlusion     Social History Social History  Substance Use Topics  . Smoking status: Former Smoker -- 1.00 packs/day for 33 years    Types: Cigarettes    Quit date: 03/16/1978  . Smokeless tobacco: Never Used     Comment: 02/25/2012 "quit smoking in 1980"  . Alcohol Use: No    Family History Family History  Problem Relation Age of Onset  . Heart disease Father     Heart Disease before age 74  . Heart attack Father   . Healthy Mother   . Hypertension Brother   . Heart attack Brother   . Hypertension Brother   . Stroke Brother   . Heart disease Brother   . Hypertension Brother   . Stroke Brother   . Heart disease Brother   .  Cancer Sister     Surgical History Past Surgical History  Procedure Laterality Date  . Colon resection  ~ 2010  . Inguinal hernia repair  ~ 2011    "left" (02/25/2012)  . Total hip arthroplasty  1980's    "right" (02/25/2012)  . Skin cancer excision      "q once in awhile; on my face" (02/25/2012)  . Carotid endarterectomy  12/02/2009    RIGHT  cea    No Known Allergies  Current Outpatient Prescriptions  Medication Sig Dispense Refill  . amLODipine (NORVASC) 5 MG tablet Take 5 mg by mouth daily.    Marland Kitchen atenolol (TENORMIN) 25 MG tablet Take 25 mg by mouth daily.    . colchicine 0.6 MG tablet Take 1 tablet (0.6 mg total) by mouth daily. 30 tablet 0   . diclofenac sodium (VOLTAREN) 1 % GEL Apply 2 g topically 4 (four) times daily. 100 g 11  . digoxin (LANOXIN) 0.125 MG tablet Take 1 tablet (0.125 mg total) by mouth daily. 90 tablet 3  . MULTIPLE VITAMINS PO Take   1 tablet daily    . Omega-3 Fatty Acids (FISH OIL) 1000 MG CAPS Take 1,000 mg by mouth daily.    . quinapril (ACCUPRIL) 10 MG tablet Take 10 mg by mouth daily.    . Tamsulosin HCl (FLOMAX) 0.4 MG CAPS Take 0.4 mg by mouth daily.    Marland Kitchen warfarin (COUMADIN) 5 MG tablet Take as directed by coumadin clinic (Patient taking differently: Take 5-7.5 mg by mouth as directed. Take 1.5 tablets all days except Monday take 1 tablet) 150 tablet 0   No current facility-administered medications for this visit.    Review of Systems : See HPI for pertinent positives and negatives.  Physical Examination  Filed Vitals:   07/25/15 1515 07/25/15 1518  BP: 120/67   Pulse: 68   Temp: 97.1 F (36.2 C)   TempSrc: Oral   Height: 5\' 11"  (1.803 m)   Weight: 174 lb 3.2 oz (79.017 kg) 174 lb 3.2 oz (79.017 kg)  SpO2: 96%    Body mass index is 24.31 kg/(m^2).  General: WDWN elderly male in NAD GAIT: shuffling Eyes: PERRLA Pulmonary:  Non-labored respirations, CTAB.  Cardiac: Irregular rhythm, no detected murmur.  VASCULAR EXAM Carotid Bruits Right Left   Negative Negative    Aorta is not palpable. Radial pulses are 3+ palpable right and 2+ palpable left.                                                                                                                            LE Pulses Right Left       FEMORAL  2+ palpable  2+ palpable        POPLITEAL  2+ palpable   2+ palpable       POSTERIOR TIBIAL  not palpable   faintly palpable        DORSALIS PEDIS      ANTERIOR  TIBIAL faintly palpable  not palpable     Gastrointestinal: soft, nontender, BS WNL, no r/g, reducible small hernia about 2 inches right of umbilicus.  Musculoskeletal: age related muscle atrophy/wasting. M/S 5/5  throughout, extremities without ischemic changes.  Neurologic: A&O X 3; Appropriate Affect, Speech is normal, CN 2-12 intact except is hard of hearing with bilateral hearing aids in place, pain and light touch intact in extremities, motor exam as listed above.   Non-Invasive Vascular Imaging CAROTID DUPLEX 07/25/2015   Right ICA:  CEA site with no restenosis. Left ICA: <40% stenosis. No significant change compared to exam of 06/06/13.   Assessment: [name] is a 82 y.o. male who has no history of stroke or TIA.  Today's carotid duplex suggests no restenosis of right ICA (CEA site), left ICA with ,40% stenosis. No significant change compared to exam of 06/06/13.  By CTA chest/abd/pelvis on 07/08/15, both internal iliac arteries are thrombosed and a small abdominal aortic aneurysm is present. Pt has 2+ palpable bilateral popliteal pulses, feet are pink and warm. He does not walk much since his right hip is painful with walking.    Plan: His physical therapist is showing him seated leg exercises.   Follow-up in 18 months with Carotid Duplex scan and already requested bilateral aortoiliac duplex.   I discussed in depth with the patient the nature of atherosclerosis, and emphasized the importance of maximal medical management including strict control of blood pressure, blood glucose, and lipid levels, obtaining regular exercise, and continued cessation of smoking.  The patient is aware that without maximal medical management the underlying atherosclerotic disease process will progress, limiting the benefit of any interventions. The patient was given information about stroke prevention and what symptoms should prompt the patient to seek immediate medical care. Thank you for allowing [place] to participate in this patient's care.  Clemon Chambers, RN, MSN, FNP-C [facility] [place] Office: [phone]  Clinic Physician: Oneida Alar  07/25/2015 3:27 PM

## 2015-07-25 NOTE — Patient Instructions (Signed)
Stroke Prevention Some medical conditions and behaviors are associated with an increased chance of having a stroke. You may prevent a stroke by making healthy choices and managing medical conditions. HOW CAN I REDUCE MY RISK OF HAVING A STROKE?   Stay physically active. Get at least 30 minutes of activity on most or all days.  Do not smoke. It may also be helpful to avoid exposure to secondhand smoke.  Limit alcohol use. Moderate alcohol use is considered to be:  No more than 2 drinks per day for men.  No more than 1 drink per day for nonpregnant women.  Eat healthy foods. This involves:  Eating 5 or more servings of fruits and vegetables a day.  Making dietary changes that address high blood pressure (hypertension), high cholesterol, diabetes, or obesity.  Manage your cholesterol levels.  Making food choices that are high in fiber and low in saturated fat, trans fat, and cholesterol may control cholesterol levels.  Take any prescribed medicines to control cholesterol as directed by your health care provider.  Manage your diabetes.  Controlling your carbohydrate and sugar intake is recommended to manage diabetes.  Take any prescribed medicines to control diabetes as directed by your health care provider.  Control your hypertension.  Making food choices that are low in salt (sodium), saturated fat, trans fat, and cholesterol is recommended to manage hypertension.  Ask your health care provider if you need treatment to lower your blood pressure. Take any prescribed medicines to control hypertension as directed by your health care provider.  If you are 18-39 years of age, have your blood pressure checked every 3-5 years. If you are 40 years of age or older, have your blood pressure checked every year.  Maintain a healthy weight.  Reducing calorie intake and making food choices that are low in sodium, saturated fat, trans fat, and cholesterol are recommended to manage  weight.  Stop drug abuse.  Avoid taking birth control pills.  Talk to your health care provider about the risks of taking birth control pills if you are over 35 years old, smoke, get migraines, or have ever had a blood clot.  Get evaluated for sleep disorders (sleep apnea).  Talk to your health care provider about getting a sleep evaluation if you snore a lot or have excessive sleepiness.  Take medicines only as directed by your health care provider.  For some people, aspirin or blood thinners (anticoagulants) are helpful in reducing the risk of forming abnormal blood clots that can lead to stroke. If you have the irregular heart rhythm of atrial fibrillation, you should be on a blood thinner unless there is a good reason you cannot take them.  Understand all your medicine instructions.  Make sure that other conditions (such as anemia or atherosclerosis) are addressed. SEEK IMMEDIATE MEDICAL CARE IF:   You have sudden weakness or numbness of the face, arm, or leg, especially on one side of the body.  Your face or eyelid droops to one side.  You have sudden confusion.  You have trouble speaking (aphasia) or understanding.  You have sudden trouble seeing in one or both eyes.  You have sudden trouble walking.  You have dizziness.  You have a loss of balance or coordination.  You have a sudden, severe headache with no known cause.  You have new chest pain or an irregular heartbeat. Any of these symptoms may represent a serious problem that is an emergency. Do not wait to see if the symptoms will   go away. Get medical help at once. Call your local emergency services (911 in [place] Do not drive yourself to the hospital.   This information is not intended to replace advice given to you by your health care provider. Make sure you discuss any questions you have with your health care provider.   Document Released: 04/09/2004 Document Revised: 03/23/2014 Document Reviewed:  09/02/2012 Elsevier Interactive Patient Education 2016 [employer]  

## 2015-07-25 NOTE — Addendum Note (Signed)
Addended by: Dorothyann Gibbs on: 07/25/2015 10:21 AM   Modules accepted: Orders

## 2015-07-29 ENCOUNTER — Inpatient Hospital Stay: Payer: Medicare HMO | Admitting: Internal Medicine

## 2015-07-29 ENCOUNTER — Telehealth: Payer: Self-pay

## 2015-07-29 NOTE — Telephone Encounter (Signed)
Pt dtr called and would like to know the status of the MRI (referral notes show it was denied).  Dtr Joanne Chars Souther) would like a call back regarding same. There is no DPR on file to speak to pt dtr or any other family member. Dtr is bringing in [place] on Friday.   Can you look into this prior to dtr and pt coming in on Friday?

## 2015-07-30 NOTE — Telephone Encounter (Signed)
done

## 2015-08-02 ENCOUNTER — Telehealth: Payer: Self-pay

## 2015-08-02 ENCOUNTER — Ambulatory Visit (HOSPITAL_COMMUNITY)
Admission: EM | Admit: 2015-08-02 | Discharge: 2015-08-02 | Disposition: A | Discharge status: Home / Self Care | Payer: Medicare HMO | Attending: Emergency Medicine | Admitting: Emergency Medicine

## 2015-08-02 ENCOUNTER — Ambulatory Visit (INDEPENDENT_AMBULATORY_CARE_PROVIDER_SITE_OTHER): Payer: Medicare HMO

## 2015-08-02 ENCOUNTER — Ambulatory Visit (INDEPENDENT_AMBULATORY_CARE_PROVIDER_SITE_OTHER): Payer: Medicare HMO | Admitting: General Practice

## 2015-08-02 ENCOUNTER — Encounter (HOSPITAL_COMMUNITY): Payer: Self-pay | Admitting: Emergency Medicine

## 2015-08-02 DIAGNOSIS — M542 Cervicalgia: Secondary | ICD-10-CM | POA: Diagnosis not present

## 2015-08-02 DIAGNOSIS — M25512 Pain in left shoulder: Secondary | ICD-10-CM | POA: Diagnosis not present

## 2015-08-02 DIAGNOSIS — I4891 Unspecified atrial fibrillation: Secondary | ICD-10-CM

## 2015-08-02 DIAGNOSIS — I482 Chronic atrial fibrillation, unspecified: Secondary | ICD-10-CM

## 2015-08-02 DIAGNOSIS — M778 Other enthesopathies, not elsewhere classified: Secondary | ICD-10-CM | POA: Diagnosis not present

## 2015-08-02 DIAGNOSIS — Z5181 Encounter for therapeutic drug level monitoring: Secondary | ICD-10-CM | POA: Diagnosis not present

## 2015-08-02 DIAGNOSIS — M25511 Pain in right shoulder: Secondary | ICD-10-CM | POA: Diagnosis not present

## 2015-08-02 DIAGNOSIS — M545 Low back pain: Secondary | ICD-10-CM | POA: Diagnosis not present

## 2015-08-02 LAB — POCT INR: INR: 3.7

## 2015-08-02 NOTE — Telephone Encounter (Signed)
Home Health Cert/Plan of Care received (07/13/2015 - 09/10/2015) and placed on MD's desk for signature

## 2015-08-02 NOTE — Progress Notes (Signed)
I have reviewed and agree with the plan. 

## 2015-08-02 NOTE — Discharge Instructions (Signed)
There is no sign of new fracture in your right wrist.  You have been diagnosed with tendinitis of the wrist today you should wear splint-  Apply warm compresses to the wrist. In use should diclofenac gel as needed. He should not take ibuprofen while taking warfarin.  He should follow-up with their primary care provider next week. He may need an injection of steroids in the wrist.

## 2015-08-02 NOTE — ED Notes (Signed)
Right hand is swollen, very painful, and redness to mid-forearm.  No known injury.  Radial pulse 2 plus.  Reports this has been this way since Tuesday.  Patient has been putting ice on wrist.

## 2015-08-02 NOTE — Progress Notes (Signed)
Pre visit review using our clinic review tool, if applicable. No additional management support is needed unless otherwise documented below in the visit note. 

## 2015-08-02 NOTE — ED Provider Notes (Signed)
CSN: [id number]     Arrival date & time 08/02/15  1710 History   First MD Initiated Contact with Patient 08/02/15 1924     Chief Complaint  Patient presents with  . Hand Pain   (Consider location/radiation/quality/duration/timing/severity/associated sxs/prior Treatment) HPI History obtained from patient: Location: Right wrist  Context/Duration: States that he was turning something on the cabinet that he was working on 2 days ago   Severity: 6  Quality: Timing:           Constant Home Treatment: None Associated symptoms:  Swelling and pain Family History: Father had heart disease.     Past Medical History  Diagnosis Date  . Hypertension   . Diverticula of colon   . Atrial fibrillation ([place])   . Skin cancer of face   . Knee pain, right     "twisted it couple days ago; pain getting worse since" (02/25/2012)  . Arthritis     "right wrist and hand" (02/25/2012)  . Enlarged prostate   . Carotid artery occlusion    Past Surgical History  Procedure Laterality Date  . Colon resection  ~ 2010  . Inguinal hernia repair  ~ 2011    "left" (02/25/2012)  . Total hip arthroplasty  1980's    "right" (02/25/2012)  . Skin cancer excision      "q once in awhile; on my face" (02/25/2012)  . Carotid endarterectomy  12/02/2009    RIGHT  cea   Family History  Problem Relation Age of Onset  . Heart disease Father     Heart Disease before age 40  . Heart attack Father   . Healthy Mother   . Hypertension Brother   . Heart attack Brother   . Hypertension Brother   . Stroke Brother   . Heart disease Brother   . Hypertension Brother   . Stroke Brother   . Heart disease Brother   . Cancer Sister    Social History  Substance Use Topics  . Smoking status: Former Smoker -- 1.00 packs/day for 33 years    Types: Cigarettes    Quit date: 03/16/1978  . Smokeless tobacco: Never Used     Comment: 02/25/2012 "quit smoking in 1980"  . Alcohol Use: No    Review of Systems  Denies:  HEADACHE, NAUSEA, ABDOMINAL PAIN, CHEST PAIN, CONGESTION, DYSURIA, SHORTNESS OF BREATH  Allergies  Review of patient's allergies indicates no known allergies.  Home Medications   Prior to Admission medications   Medication Sig Start Date End Date Taking? Authorizing Provider  amLODipine (NORVASC) 5 MG tablet Take 5 mg by mouth daily.    Historical Provider, MD  atenolol (TENORMIN) 25 MG tablet Take 25 mg by mouth daily.    Historical Provider, MD  colchicine 0.6 MG tablet Take 1 tablet (0.6 mg total) by mouth daily. 04/22/15   Alfonzo Beers, MD  diclofenac sodium (VOLTAREN) 1 % GEL Apply 2 g topically 4 (four) times daily. 05/08/15   Janith Lima, MD  digoxin (LANOXIN) 0.125 MG tablet Take 1 tablet (0.125 mg total) by mouth daily. 04/25/15   Dorothy Spark, MD  MULTIPLE VITAMINS PO Take   1 tablet daily    Historical Provider, MD  Omega-3 Fatty Acids (FISH OIL) 1000 MG CAPS Take 1,000 mg by mouth daily.    Historical Provider, MD  quinapril (ACCUPRIL) 10 MG tablet Take 10 mg by mouth daily.    Historical Provider, MD  Tamsulosin HCl (FLOMAX) 0.4 MG CAPS Take 0.4  mg by mouth daily.    Historical Provider, MD  warfarin (COUMADIN) 5 MG tablet Take as directed by coumadin clinic Patient taking differently: Take 5-7.5 mg by mouth as directed. Take 1.5 tablets all days except Monday take 1 tablet 04/25/15   Jettie Booze, MD   Meds Ordered and Administered this Visit  Medications - No data to display  BP 159/87 mmHg  Pulse 64  Temp(Src) 98.6 F (37 C) (Oral)  Resp 12  SpO2 99% No data found.   Physical Exam NURSES NOTES AND VITAL SIGNS REVIEWED. CONSTITUTIONAL: Well developed, well nourished, no acute distress HEENT: normocephalic, atraumatic EYES: Conjunctiva normal NECK:normal ROM, supple, no adenopathy PULMONARY:No respiratory distress, normal effort ABDOMINAL: Soft, ND, NT BS+, No CVAT MUSCULOSKELETAL:   Swelling and tenderness of the right wrist. It is pitting edema noted.  Patient does not want the wrist touched. SKIN: warm and dry without rash PSYCHIATRIC: Mood and affect, behavior are normal  ED Course  Procedures (including critical care time)  Labs Review Labs Reviewed - No data to display  Imaging Review Dg Wrist Complete Right  08/02/2015  CLINICAL DATA:  82 year old male with pain and swelling of the right wrist. EXAM: RIGHT WRIST - COMPLETE 3+ VIEW COMPARISON:  Radiograph dated 05/06/2015 FINDINGS: There is no acute fracture or dislocation. The bones are osteopenic. There is advanced osteoarthritic changes of the intercarpal joints as well as osteoarthritic changes of the radiocarpal joint with joint space narrowing. 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 There is diffuse soft tissue swelling of the wrist. IMPRESSION: No acute fracture.  Advanced osteoarthritic changes. Electronically Signed   By: Anner Crete M.D.   On: 08/02/2015 19:52   I HAVE PERSONALLY  REVIEWED AND DISCUSSED RESULTS OF  X-RAYS WITH PATIENT AND FAMILY PRIOR TO DISCHARGE.     Visual Acuity Review  Right Eye Distance:   Left Eye Distance:   Bilateral Distance:    Right Eye Near:   Left Eye Near:    Bilateral Near:      JONES DRESSING APPLIED TO RIGHT WRIST BY NURSING STAFF.   MDM   1. Right wrist tendonitis     Patient is reassured that there are no issues that require transfer to higher level of care at this time or additional tests. Patient is advised to continue home symptomatic treatment. Patient is advised that if there are new or worsening symptoms to attend the emergency department, contact primary care provider, or return to UC. Instructions of care provided discharged home in stable condition.    THIS NOTE WAS GENERATED USING A VOICE RECOGNITION SOFTWARE PROGRAM. ALL REASONABLE EFFORTS  WERE MADE TO PROOFREAD THIS DOCUMENT FOR ACCURACY.  I have verbally reviewed the discharge instructions with the patient. A printed AVS was  given to the patient.  All questions were answered prior to discharge.      Konrad Felix, PA 08/02/15 2011

## 2015-08-05 ENCOUNTER — Ambulatory Visit: Payer: Medicare HMO | Admitting: Internal Medicine

## 2015-08-06 ENCOUNTER — Telehealth: Payer: Self-pay | Admitting: Internal Medicine

## 2015-08-06 NOTE — Telephone Encounter (Signed)
FYI:  Right hand is swollen and patient is in pain.  Patient has appointment scheduled for tomorrow 5/24 Also, Requesting verbal nursing order.

## 2015-08-06 NOTE — Telephone Encounter (Signed)
Paperwork signed, faxed, copy sent to scan 

## 2015-08-06 NOTE — Telephone Encounter (Signed)
LMOVM to call the office back.

## 2015-08-07 ENCOUNTER — Other Ambulatory Visit (INDEPENDENT_AMBULATORY_CARE_PROVIDER_SITE_OTHER): Payer: Medicare HMO

## 2015-08-07 ENCOUNTER — Telehealth: Payer: Self-pay | Admitting: Internal Medicine

## 2015-08-07 ENCOUNTER — Ambulatory Visit (INDEPENDENT_AMBULATORY_CARE_PROVIDER_SITE_OTHER): Payer: Medicare HMO | Admitting: Internal Medicine

## 2015-08-07 ENCOUNTER — Encounter: Payer: Self-pay | Admitting: Internal Medicine

## 2015-08-07 VITALS — BP 120/70 | HR 70 | Temp 97.6°F | Resp 16 | Ht 71.0 in | Wt 170.0 lb

## 2015-08-07 DIAGNOSIS — N183 Chronic kidney disease, stage 3 unspecified: Secondary | ICD-10-CM

## 2015-08-07 DIAGNOSIS — M10031 Idiopathic gout, right wrist: Secondary | ICD-10-CM

## 2015-08-07 DIAGNOSIS — I10 Essential (primary) hypertension: Secondary | ICD-10-CM

## 2015-08-07 DIAGNOSIS — D539 Nutritional anemia, unspecified: Secondary | ICD-10-CM | POA: Diagnosis not present

## 2015-08-07 LAB — CBC WITH DIFFERENTIAL/PLATELET
BASOS ABS: 0 10*3/uL (ref 0.0–0.1)
Basophils Relative: 0.7 % (ref 0.0–3.0)
EOS ABS: 0.2 10*3/uL (ref 0.0–0.7)
Eosinophils Relative: 4 % (ref 0.0–5.0)
HCT: 28.2 % — ABNORMAL LOW (ref 39.0–52.0)
Hemoglobin: 9.2 g/dL — ABNORMAL LOW (ref 13.0–17.0)
LYMPHS ABS: 1.5 10*3/uL (ref 0.7–4.0)
Lymphocytes Relative: 31 % (ref 12.0–46.0)
MCHC: 32.5 g/dL (ref 30.0–36.0)
MCV: 81.2 fl (ref 78.0–100.0)
Monocytes Absolute: 0.4 10*3/uL (ref 0.1–1.0)
Monocytes Relative: 8.2 % (ref 3.0–12.0)
NEUTROS ABS: 2.7 10*3/uL (ref 1.4–7.7)
NEUTROS PCT: 56.1 % (ref 43.0–77.0)
PLATELETS: 230 10*3/uL (ref 150.0–400.0)
RBC: 3.47 Mil/uL — ABNORMAL LOW (ref 4.22–5.81)
RDW: 15.9 % — ABNORMAL HIGH (ref 11.5–15.5)
WBC: 4.8 10*3/uL (ref 4.0–10.5)

## 2015-08-07 LAB — URIC ACID: Uric Acid, Serum: 7.9 mg/dL — ABNORMAL HIGH (ref 4.0–7.8)

## 2015-08-07 LAB — BASIC METABOLIC PANEL
BUN: 54 mg/dL — ABNORMAL HIGH (ref 6–23)
CHLORIDE: 108 meq/L (ref 96–112)
CO2: 25 mEq/L (ref 19–32)
Calcium: 9.6 mg/dL (ref 8.4–10.5)
Creatinine, Ser: 1.89 mg/dL — ABNORMAL HIGH (ref 0.40–1.50)
GFR: 35.76 mL/min — ABNORMAL LOW (ref >60.00)
Glucose, Bld: 95 mg/dL (ref 70–99)
POTASSIUM: 4.4 meq/L (ref 3.5–5.1)
Sodium: 140 mEq/L (ref 135–145)

## 2015-08-07 LAB — VITAMIN B12: Vitamin B-12: 302 pg/mL (ref 211–911)

## 2015-08-07 LAB — FERRITIN: FERRITIN: 259.6 ng/mL (ref 22.0–322.0)

## 2015-08-07 LAB — IBC PANEL
IRON: 60 ug/dL (ref 42–165)
SATURATION RATIOS: 25.4 % (ref 20.0–50.0)
TRANSFERRIN: 169 mg/dL — AB (ref 212.0–360.0)

## 2015-08-07 LAB — FOLATE: FOLATE: 10 ng/mL (ref >5.9)

## 2015-08-07 MED ORDER — METHYLPREDNISOLONE ACETATE 80 MG/ML IJ SUSP
120.0000 mg | Freq: Once | INTRAMUSCULAR | Status: AC
  Administered 2015-08-07: 120 mg via INTRAMUSCULAR

## 2015-08-07 MED ORDER — ALLOPURINOL 100 MG PO TABS: 100.0000 mg | ORAL_TABLET | Freq: Every day | ORAL | Status: DC

## 2015-08-07 NOTE — Progress Notes (Signed)
Subjective:  Patient ID: [name], male    DOB: Sep 15, 1923  Age: 82 y.o. MRN: [id number]  CC: Anemia; Hypertension; and Gout   HPI [name] presents for a one-week history of right wrist pain and swelling with no history of trauma. He tells me that about 5 days ago he was seen at an urgent care center and was told that the x-ray was unremarkable. He has a history of gout. He has tried colchicine and Tylenol with very minimal symptom relief.   Is also due for follow-up on anemia. He complains of fatigue but he denies blood loss, paresthesias, abdominal pain, or diarrhea.  Outpatient Prescriptions Prior to Visit  Medication Sig Dispense Refill  . amLODipine (NORVASC) 5 MG tablet Take 5 mg by mouth daily.    Marland Kitchen atenolol (TENORMIN) 25 MG tablet Take 25 mg by mouth daily.    . colchicine 0.6 MG tablet Take 1 tablet (0.6 mg total) by mouth daily. 30 tablet 0  . diclofenac sodium (VOLTAREN) 1 % GEL Apply 2 g topically 4 (four) times daily. 100 g 11  . digoxin (LANOXIN) 0.125 MG tablet Take 1 tablet (0.125 mg total) by mouth daily. 90 tablet 3  . MULTIPLE VITAMINS PO Take   1 tablet daily    . Omega-3 Fatty Acids (FISH OIL) 1000 MG CAPS Take 1,000 mg by mouth daily.    . quinapril (ACCUPRIL) 10 MG tablet Take 10 mg by mouth daily.    . Tamsulosin HCl (FLOMAX) 0.4 MG CAPS Take 0.4 mg by mouth daily.    Marland Kitchen warfarin (COUMADIN) 5 MG tablet Take as directed by coumadin clinic (Patient taking differently: Take 5-7.5 mg by mouth as directed. Take 1.5 tablets all days except Monday take 1 tablet) 150 tablet 0   No facility-administered medications prior to visit.    ROS Review of Systems  Constitutional: Positive for fatigue. Negative for fever, chills, diaphoresis, activity change, appetite change and unexpected weight change.  HENT: Negative.   Eyes: Negative.   Respiratory: Negative.  Negative for cough, choking and shortness of breath.   Cardiovascular: Negative.  Negative for chest  pain, palpitations and leg swelling.  Gastrointestinal: Negative.  Negative for nausea, vomiting, abdominal pain, diarrhea, constipation and blood in stool.  Endocrine: Negative.   Genitourinary: Negative.  Negative for difficulty urinating.  Musculoskeletal: Positive for arthralgias (rt wrist only). Negative for myalgias, back pain and neck pain.  Skin: Negative.  Negative for color change and rash.  Allergic/Immunologic: Negative.   Neurological: Negative.   Hematological: Negative.  Negative for adenopathy. Does not bruise/bleed easily.  Psychiatric/Behavioral: Negative.     Objective:  BP 120/70 mmHg  Pulse 70  Temp(Src) 97.6 F (36.4 C) (Oral)  Resp 16  Ht 5\' 11"  (1.803 m)  Wt 170 lb (77.111 kg)  BMI 23.72 kg/m2  SpO2 97%  BP Readings from Last 3 Encounters:  08/07/15 120/70  08/02/15 159/87  07/25/15 120/67    Wt Readings from Last 3 Encounters:  08/07/15 170 lb (77.111 kg)  07/25/15 174 lb 3.2 oz (79.017 kg)  07/17/15 171 lb 3.2 oz (77.656 kg)    Physical Exam  Constitutional: He is oriented to person, place, and time. He appears well-developed and well-nourished. No distress.  HENT:  Mouth/Throat: Oropharynx is clear and moist. No oropharyngeal exudate.  Eyes: Conjunctivae are normal. Right eye exhibits no discharge. Left eye exhibits no discharge. No scleral icterus.  Neck: Normal range of motion. Neck supple. No JVD  present. No tracheal deviation present. No thyromegaly present.  Cardiovascular: Normal rate, regular rhythm, normal heart sounds and intact distal pulses.  Exam reveals no gallop and no friction rub.   No murmur heard. Pulmonary/Chest: Effort normal and breath sounds normal. No stridor. No respiratory distress. He has no wheezes. He has no rales. He exhibits no tenderness.  Abdominal: Soft. Bowel sounds are normal. He exhibits no distension and no mass. There is no tenderness. There is no rebound and no guarding.  Musculoskeletal: He exhibits no  edema.       Right wrist: He exhibits decreased range of motion, tenderness and swelling. He exhibits no bony tenderness, no effusion, no crepitus, no deformity and no laceration.  His right wrist is mildly swollen, tender, and there is very faint erythema. Rolling extends onto the dorsum of the hand. There is no warmth, wounds, exudate, induration, or fluctuance.  Lymphadenopathy:    He has no cervical adenopathy.  Neurological: He is oriented to person, place, and time.  Skin: Skin is warm and dry. No rash noted. He is not diaphoretic. No erythema. No pallor.  Vitals reviewed.   Lab Results  Component Value Date   WBC 4.8 08/07/2015   HGB 9.2* 08/07/2015   HCT 28.2* 08/07/2015   PLT 230.0 08/07/2015   GLUCOSE 95 08/07/2015   ALT 18 07/08/2015   AST 25 07/08/2015   NA 140 08/07/2015   K 4.4 08/07/2015   CL 108 08/07/2015   CREATININE 1.89* 08/07/2015   BUN 54* 08/07/2015   CO2 25 08/07/2015   INR 3.7 08/02/2015    Dg Wrist Complete Right  08/02/2015  CLINICAL DATA:  82 year old male with pain and swelling of the right wrist. EXAM: RIGHT WRIST - COMPLETE 3+ VIEW COMPARISON:  Radiograph dated 05/06/2015 FINDINGS: There is no acute fracture or dislocation. The bones are osteopenic. There is advanced osteoarthritic changes of the intercarpal joints as well as osteoarthritic changes of the radiocarpal joint with joint space narrowing. 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 There is diffuse soft tissue swelling of the wrist. IMPRESSION: No acute fracture.  Advanced osteoarthritic changes. Electronically Signed   By: Anner Crete M.D.   On: 08/02/2015 19:52    Assessment & Plan:   [name] was seen today for anemia, hypertension and gout.  Diagnoses and all orders for this visit:  Deficiency anemia- slight improvement in his H&H is noted today, I will screen him for vitamin deficiencies and bone marrow failure with the below listed lab work. -     CBC with  Differential/Platelet; Future -     IBC panel; Future -     Ferritin; Future -     Folate; Future -     Reticulocytes; Future -     Vitamin B12; Future  Essential hypertension- his blood pressure is well-controlled, electrolytes and renal function are stable. -     CBC with Differential/Platelet; Future -     Basic metabolic panel; Future  Kidney disease, chronic, stage III (GFR 30-59 ml/min)- renal function is stable, he will continue to avoid nephrotoxic agents, I will continue to maintain good blood pressure control. -     Basic metabolic panel; Future -     Uric acid; Future  Acute idiopathic gout of right wrist- he does not want to take a narcotic for pain, I gave him an injection of Depo-Medrol to reduce the pain and swelling, he is artery taking allopurinol and colchicine so we will continue  those. -     Uric acid; Future -     methylPREDNISolone acetate (DEPO-MEDROL) injection 120 mg; Inject 1.5 mLs (120 mg total) into the muscle once. -     allopurinol (ZYLOPRIM) 100 MG tablet; Take 1 tablet (100 mg total) by mouth daily.   I have changed Mr. Morash's allopurinol. I am also having him maintain his tamsulosin, Fish Oil, MULTIPLE VITAMINS PO, amLODipine, atenolol, quinapril, colchicine, warfarin, digoxin, and diclofenac sodium. We administered methylPREDNISolone acetate.  Meds ordered this encounter  Medications  . methylPREDNISolone acetate (DEPO-MEDROL) injection 120 mg    Sig:   . allopurinol (ZYLOPRIM) 100 MG tablet    Sig: Take 1 tablet (100 mg total) by mouth daily.    Dispense:  30 tablet    Refill:  11     Follow-up: Return in about 2 months (around 10/07/2015).  Scarlette Calico, MD

## 2015-08-07 NOTE — Progress Notes (Signed)
Pre visit review using our clinic review tool, if applicable. No additional management support is needed unless otherwise documented below in the visit note. 

## 2015-08-07 NOTE — Patient Instructions (Signed)

## 2015-08-07 NOTE — Telephone Encounter (Signed)
allpurinol 100mg  1 tab daily. States not sure if father is taking regularly.   Daughter states pt had med listed above

## 2015-08-07 NOTE — Telephone Encounter (Signed)
Would like a call back states she was following back up with Gabby in regards to what medication father is taking for gout.

## 2015-08-07 NOTE — Telephone Encounter (Signed)
Pt seen in office today.

## 2015-08-08 LAB — RETICULOCYTES
ABS Retic: 31140 cells/uL (ref 25000–90000)
RBC.: 3.46 MIL/uL — AB (ref 4.20–5.80)
RETIC CT PCT: 0.9 %

## 2015-08-21 NOTE — Addendum Note (Signed)
Addended by: Thresa Ross C on: 08/21/2015 11:37 AM   Modules accepted: Orders

## 2015-08-28 ENCOUNTER — Ambulatory Visit (INDEPENDENT_AMBULATORY_CARE_PROVIDER_SITE_OTHER): Payer: Medicare HMO | Admitting: General Practice

## 2015-08-28 DIAGNOSIS — I4891 Unspecified atrial fibrillation: Secondary | ICD-10-CM | POA: Diagnosis not present

## 2015-08-28 DIAGNOSIS — Z5181 Encounter for therapeutic drug level monitoring: Secondary | ICD-10-CM

## 2015-08-28 LAB — POCT INR: INR: 1.6

## 2015-08-28 NOTE — Progress Notes (Signed)
I have reviewed and agree with the plan. 

## 2015-08-28 NOTE — Progress Notes (Signed)
Pre visit review using our clinic review tool, if applicable. No additional management support is needed unless otherwise documented below in the visit note. 

## 2015-08-30 ENCOUNTER — Ambulatory Visit: Payer: Medicare HMO

## 2015-09-02 NOTE — Addendum Note (Signed)
Addended by: Mena Goes on: 09/02/2015 04:53 PM   Modules accepted: Orders

## 2015-09-09 ENCOUNTER — Other Ambulatory Visit: Payer: Self-pay | Admitting: *Deleted

## 2015-09-09 MED ORDER — COLCHICINE 0.6 MG PO TABS: 0.6000 mg | ORAL_TABLET | Freq: Every day | ORAL | Status: DC

## 2015-09-11 ENCOUNTER — Telehealth: Payer: Self-pay | Admitting: *Deleted

## 2015-09-11 NOTE — Telephone Encounter (Signed)
Left msg on triage stating needing dad medication list & last ov fax to his MD @ [place]. Can fax to [phone] or [phone]. Called daughter back [facility] information has been faxed...Johny Chess

## 2015-09-13 ENCOUNTER — Telehealth: Payer: Self-pay | Admitting: Internal Medicine

## 2015-09-13 NOTE — Telephone Encounter (Signed)
Elta Guadeloupe called from [facility] request verbal order for PT to re assessment next week. Please call him back

## 2015-09-15 NOTE — Telephone Encounter (Signed)
yes

## 2015-09-16 NOTE — Telephone Encounter (Signed)
Lm with verbal okay for PT re assessment.

## 2015-09-18 ENCOUNTER — Telehealth: Payer: Self-pay | Admitting: Internal Medicine

## 2015-09-18 NOTE — Telephone Encounter (Signed)
Pramod called from [facility] request verbal order to continue PT 3 more weeks : 2 times a week. Please call him back

## 2015-09-18 NOTE — Telephone Encounter (Signed)
yes

## 2015-09-18 NOTE — Telephone Encounter (Signed)
He aware.

## 2015-09-27 ENCOUNTER — Ambulatory Visit (INDEPENDENT_AMBULATORY_CARE_PROVIDER_SITE_OTHER): Payer: Medicare HMO | Admitting: General Practice

## 2015-09-27 ENCOUNTER — Ambulatory Visit: Payer: Medicare HMO

## 2015-09-27 DIAGNOSIS — Z5181 Encounter for therapeutic drug level monitoring: Secondary | ICD-10-CM

## 2015-09-27 DIAGNOSIS — I4891 Unspecified atrial fibrillation: Secondary | ICD-10-CM

## 2015-09-27 LAB — POCT INR: INR: 1.4

## 2015-09-27 NOTE — Progress Notes (Signed)
I have reviewed and agree with the plan. 

## 2015-09-27 NOTE — Progress Notes (Signed)
Pre visit review using our clinic review tool, if applicable. No additional management support is needed unless otherwise documented below in the visit note. 

## 2015-10-07 ENCOUNTER — Ambulatory Visit: Payer: Medicare HMO | Admitting: Internal Medicine

## 2015-10-25 ENCOUNTER — Ambulatory Visit (INDEPENDENT_AMBULATORY_CARE_PROVIDER_SITE_OTHER): Payer: Self-pay | Admitting: General Practice

## 2015-10-25 DIAGNOSIS — Z5181 Encounter for therapeutic drug level monitoring: Secondary | ICD-10-CM

## 2015-10-25 LAB — POCT INR: INR: 2.6

## 2015-10-25 NOTE — Progress Notes (Signed)
I have reviewed and agree with the plan. 

## 2015-11-12 ENCOUNTER — Telehealth: Payer: Self-pay | Admitting: Internal Medicine

## 2015-11-12 MED ORDER — COLCHICINE 0.6 MG PO TABS: 0.6000 mg | ORAL_TABLET | Freq: Every day | ORAL | 0 refills | Status: DC

## 2015-11-12 NOTE — Telephone Encounter (Signed)
Printed script & faxed to [place] in [place] @ [phone]...Johny Chess

## 2015-11-12 NOTE — Telephone Encounter (Signed)
Pt request colchicine 0.6 MG tablet resend to [facility] in [place] Fax # [phone]. Please help, it is free for him at this pharmacy and they are trying to get all of this med at one place.

## 2015-11-14 MED ORDER — COLCHICINE 0.6 MG PO TABS: 0.6000 mg | ORAL_TABLET | Freq: Every day | ORAL | 0 refills | Status: DC

## 2015-11-14 NOTE — Addendum Note (Signed)
Addended by: Earnstine Regal on: 11/14/2015 02:00 PM   Modules accepted: Orders

## 2015-11-14 NOTE — Telephone Encounter (Signed)
Printed script & refaxed to # below for Dr. Bing Quarry...[name]

## 2015-11-14 NOTE — Telephone Encounter (Signed)
Daughter call stating [facility] did not get [facility] that we fax over on 11/12/15. Please refax to [phone] att: Dr. Macario Golds. Please help

## 2015-11-22 ENCOUNTER — Ambulatory Visit: Payer: Medicare HMO

## 2015-11-29 ENCOUNTER — Ambulatory Visit (INDEPENDENT_AMBULATORY_CARE_PROVIDER_SITE_OTHER): Payer: Medicare HMO | Admitting: General Practice

## 2015-11-29 ENCOUNTER — Ambulatory Visit: Payer: Medicare HMO

## 2015-11-29 DIAGNOSIS — Z5181 Encounter for therapeutic drug level monitoring: Secondary | ICD-10-CM | POA: Diagnosis not present

## 2015-11-29 DIAGNOSIS — I4891 Unspecified atrial fibrillation: Secondary | ICD-10-CM

## 2015-11-29 LAB — POCT INR: INR: 4.3

## 2015-11-29 NOTE — Progress Notes (Signed)
I have reviewed and agree with the plan. 

## 2015-11-29 NOTE — Patient Instructions (Signed)
Pre visit review using our clinic review tool, if applicable. No additional management support is needed unless otherwise documented below in the visit note. Patient is high today.  Patient states " I may have taken to much".  Patient's son was with him this morning and says that patient does use a pill box and that he helps with patient's medications.  I held coumadin for 2 days and then will have pt continue current dosage.  Patient will be re-checked in 3 to 4 weeks.  Reiterated the risks of a supra-therapeutic INR.  Patient and son verbalized understanding.

## 2015-12-05 ENCOUNTER — Telehealth: Payer: Self-pay | Admitting: *Deleted

## 2015-12-05 ENCOUNTER — Other Ambulatory Visit: Payer: Self-pay | Admitting: General Practice

## 2015-12-05 MED ORDER — WARFARIN SODIUM 5 MG PO TABS: ORAL_TABLET | ORAL | 0 refills | Status: DC

## 2015-12-05 NOTE — Telephone Encounter (Signed)
Left msg on triage stating been trying to get a refill on pt Warfarin currently is out needing med today. Forwarding msg to [place] in coumadin clinic...Johny Chess

## 2015-12-17 ENCOUNTER — Encounter (HOSPITAL_COMMUNITY): Payer: Self-pay

## 2015-12-17 ENCOUNTER — Telehealth: Payer: Self-pay | Admitting: Emergency Medicine

## 2015-12-17 ENCOUNTER — Inpatient Hospital Stay (HOSPITAL_COMMUNITY)
Admission: EM | Admit: 2015-12-17 | Discharge: 2015-12-20 | DRG: 554 | Disposition: A | Discharge status: Home Health | Payer: Medicare HMO | Attending: Internal Medicine | Admitting: Internal Medicine

## 2015-12-17 ENCOUNTER — Other Ambulatory Visit: Payer: Self-pay | Admitting: Internal Medicine

## 2015-12-17 DIAGNOSIS — M109 Gout, unspecified: Principal | ICD-10-CM | POA: Diagnosis present

## 2015-12-17 DIAGNOSIS — Z79899 Other long term (current) drug therapy: Secondary | ICD-10-CM

## 2015-12-17 DIAGNOSIS — I10 Essential (primary) hypertension: Secondary | ICD-10-CM

## 2015-12-17 DIAGNOSIS — D649 Anemia, unspecified: Secondary | ICD-10-CM | POA: Diagnosis present

## 2015-12-17 DIAGNOSIS — I129 Hypertensive chronic kidney disease with stage 1 through stage 4 chronic kidney disease, or unspecified chronic kidney disease: Secondary | ICD-10-CM | POA: Diagnosis present

## 2015-12-17 DIAGNOSIS — I480 Paroxysmal atrial fibrillation: Secondary | ICD-10-CM | POA: Diagnosis not present

## 2015-12-17 DIAGNOSIS — M19031 Primary osteoarthritis, right wrist: Secondary | ICD-10-CM | POA: Diagnosis present

## 2015-12-17 DIAGNOSIS — N400 Enlarged prostate without lower urinary tract symptoms: Secondary | ICD-10-CM | POA: Diagnosis present

## 2015-12-17 DIAGNOSIS — N183 Chronic kidney disease, stage 3 unspecified: Secondary | ICD-10-CM | POA: Diagnosis present

## 2015-12-17 DIAGNOSIS — Z87891 Personal history of nicotine dependence: Secondary | ICD-10-CM

## 2015-12-17 DIAGNOSIS — Z823 Family history of stroke: Secondary | ICD-10-CM | POA: Diagnosis not present

## 2015-12-17 DIAGNOSIS — Z8249 Family history of ischemic heart disease and other diseases of the circulatory system: Secondary | ICD-10-CM

## 2015-12-17 DIAGNOSIS — I4891 Unspecified atrial fibrillation: Secondary | ICD-10-CM | POA: Diagnosis present

## 2015-12-17 DIAGNOSIS — Z85828 Personal history of other malignant neoplasm of skin: Secondary | ICD-10-CM

## 2015-12-17 DIAGNOSIS — Z96641 Presence of right artificial hip joint: Secondary | ICD-10-CM | POA: Diagnosis present

## 2015-12-17 DIAGNOSIS — I481 Persistent atrial fibrillation: Secondary | ICD-10-CM | POA: Diagnosis present

## 2015-12-17 DIAGNOSIS — Z7901 Long term (current) use of anticoagulants: Secondary | ICD-10-CM

## 2015-12-17 DIAGNOSIS — M1009 Idiopathic gout, multiple sites: Secondary | ICD-10-CM | POA: Diagnosis not present

## 2015-12-17 DIAGNOSIS — M1000 Idiopathic gout, unspecified site: Secondary | ICD-10-CM | POA: Diagnosis not present

## 2015-12-17 LAB — CBC WITH DIFFERENTIAL/PLATELET
Basophils Absolute: 0 10*3/uL (ref 0.0–0.1)
Basophils Relative: 0 %
EOS PCT: 0 %
Eosinophils Absolute: 0 10*3/uL (ref 0.0–0.7)
HCT: 33.9 % — ABNORMAL LOW (ref 39.0–52.0)
Hemoglobin: 10.7 g/dL — ABNORMAL LOW (ref 13.0–17.0)
LYMPHS ABS: 0.6 10*3/uL — AB (ref 0.7–4.0)
LYMPHS PCT: 5 %
MCH: 27.5 pg (ref 26.0–34.0)
MCHC: 31.6 g/dL (ref 30.0–36.0)
MCV: 87.1 fL (ref 78.0–100.0)
MONO ABS: 1.1 10*3/uL — AB (ref 0.1–1.0)
MONOS PCT: 10 %
Neutro Abs: 9.3 10*3/uL — ABNORMAL HIGH (ref 1.7–7.7)
Neutrophils Relative %: 85 %
PLATELETS: 161 10*3/uL (ref 150–400)
RBC: 3.89 MIL/uL — AB (ref 4.22–5.81)
RDW: 16.1 % — AB (ref 11.5–15.5)
WBC: 10.9 10*3/uL — ABNORMAL HIGH (ref 4.0–10.5)

## 2015-12-17 LAB — DIGOXIN LEVEL: DIGOXIN LVL: 1.1 ng/mL (ref 0.8–2.0)

## 2015-12-17 LAB — PROTIME-INR
INR: 1.73
Prothrombin Time: 20.5 seconds — ABNORMAL HIGH (ref 11.4–15.2)

## 2015-12-17 LAB — BASIC METABOLIC PANEL
Anion gap: 3 — ABNORMAL LOW (ref 5–15)
BUN: 27 mg/dL — AB (ref 6–20)
CO2: 25 mmol/L (ref 22–32)
Calcium: 9.9 mg/dL (ref 8.9–10.3)
Chloride: 112 mmol/L — ABNORMAL HIGH (ref 101–111)
Creatinine, Ser: 1.32 mg/dL — ABNORMAL HIGH (ref 0.61–1.24)
GFR calc Af Amer: 53 mL/min — ABNORMAL LOW (ref >60)
GFR, EST NON AFRICAN AMERICAN: 46 mL/min — AB (ref >60)
GLUCOSE: 117 mg/dL — AB (ref 65–99)
POTASSIUM: 4.2 mmol/L (ref 3.5–5.1)
Sodium: 140 mmol/L (ref 135–145)

## 2015-12-17 LAB — URIC ACID: Uric Acid, Serum: 7 mg/dL (ref 4.4–7.6)

## 2015-12-17 MED ORDER — FENTANYL CITRATE (PF) 100 MCG/2ML IJ SOLN
25.0000 ug | INTRAMUSCULAR | Status: DC | PRN
  Administered 2015-12-17: 25 ug via INTRAVENOUS
  Filled 2015-12-17: qty 2

## 2015-12-17 MED ORDER — AMLODIPINE BESYLATE 5 MG PO TABS
5.0000 mg | ORAL_TABLET | Freq: Every day | ORAL | Status: DC
  Administered 2015-12-18 – 2015-12-20 (×3): 5 mg via ORAL
  Filled 2015-12-17 (×2): qty 1

## 2015-12-17 MED ORDER — ATENOLOL 25 MG PO TABS
25.0000 mg | ORAL_TABLET | Freq: Every day | ORAL | Status: DC
  Administered 2015-12-18 – 2015-12-20 (×3): 25 mg via ORAL
  Filled 2015-12-17 (×3): qty 1

## 2015-12-17 MED ORDER — ACETAMINOPHEN 650 MG RE SUPP: 650.0000 mg | Freq: Four times a day (QID) | RECTAL | Status: DC | PRN

## 2015-12-17 MED ORDER — SODIUM CHLORIDE 0.9 % IV SOLN
INTRAVENOUS | Status: DC
Start: 2015-12-17 — End: 2015-12-17

## 2015-12-17 MED ORDER — COLCHICINE 0.6 MG PO TABS
0.6000 mg | ORAL_TABLET | Freq: Every day | ORAL | Status: DC
  Administered 2015-12-18 – 2015-12-20 (×3): 0.6 mg via ORAL
  Filled 2015-12-17 (×3): qty 1

## 2015-12-17 MED ORDER — ONDANSETRON HCL 4 MG/2ML IJ SOLN
4.0000 mg | Freq: Three times a day (TID) | INTRAMUSCULAR | Status: DC | PRN
Start: 2015-12-17 — End: 2015-12-17

## 2015-12-17 MED ORDER — FISH OIL 1000 MG PO CAPS: 1000.0000 mg | ORAL_CAPSULE | Freq: Every day | ORAL | Status: DC

## 2015-12-17 MED ORDER — SODIUM CHLORIDE 0.9 % IV BOLUS (SEPSIS)
500.0000 mL | Freq: Once | INTRAVENOUS | Status: AC
  Administered 2015-12-17: 500 mL via INTRAVENOUS

## 2015-12-17 MED ORDER — DIGOXIN 125 MCG PO TABS
0.1250 mg | ORAL_TABLET | Freq: Every day | ORAL | Status: DC
  Administered 2015-12-18 – 2015-12-20 (×3): 0.125 mg via ORAL
  Filled 2015-12-17 (×3): qty 1

## 2015-12-17 MED ORDER — TAMSULOSIN HCL 0.4 MG PO CAPS
0.4000 mg | ORAL_CAPSULE | Freq: Every day | ORAL | Status: DC
  Administered 2015-12-18 – 2015-12-20 (×3): 0.4 mg via ORAL
  Filled 2015-12-17 (×3): qty 1

## 2015-12-17 MED ORDER — ACETAMINOPHEN 325 MG PO TABS: 650.0000 mg | ORAL_TABLET | Freq: Four times a day (QID) | ORAL | Status: DC | PRN

## 2015-12-17 MED ORDER — SODIUM CHLORIDE 0.9 % IV SOLN
Freq: Once | INTRAVENOUS | Status: AC
  Administered 2015-12-17: 20:00:00 via INTRAVENOUS

## 2015-12-17 MED ORDER — LISINOPRIL 10 MG PO TABS
10.0000 mg | ORAL_TABLET | Freq: Every day | ORAL | Status: DC
  Administered 2015-12-18 – 2015-12-20 (×3): 10 mg via ORAL
  Filled 2015-12-17 (×3): qty 1

## 2015-12-17 MED ORDER — FENTANYL CITRATE (PF) 100 MCG/2ML IJ SOLN
25.0000 ug | Freq: Once | INTRAMUSCULAR | Status: AC
  Administered 2015-12-17: 25 ug via INTRAVENOUS
  Filled 2015-12-17: qty 2

## 2015-12-17 MED ORDER — METHYLPREDNISOLONE SODIUM SUCC 125 MG IJ SOLR
125.0000 mg | Freq: Once | INTRAMUSCULAR | Status: AC
  Administered 2015-12-17: 125 mg via INTRAVENOUS
  Filled 2015-12-17: qty 2

## 2015-12-17 MED ORDER — DICLOFENAC SODIUM 1 % TD GEL
2.0000 g | Freq: Four times a day (QID) | TRANSDERMAL | Status: DC
  Administered 2015-12-18 (×3): 2 g via TOPICAL
  Filled 2015-12-17: qty 100

## 2015-12-17 MED ORDER — WARFARIN SODIUM 2.5 MG PO TABS
2.5000 mg | ORAL_TABLET | Freq: Once | ORAL | Status: AC
  Administered 2015-12-18: 2.5 mg via ORAL
  Filled 2015-12-17: qty 1

## 2015-12-17 MED ORDER — ONDANSETRON HCL 4 MG/2ML IJ SOLN
4.0000 mg | Freq: Once | INTRAMUSCULAR | Status: AC
  Administered 2015-12-17: 4 mg via INTRAVENOUS
  Filled 2015-12-17: qty 2

## 2015-12-17 MED ORDER — WARFARIN - PHARMACIST DOSING INPATIENT
Freq: Every day | Status: DC
Start: 2015-12-18 — End: 2015-12-20
  Administered 2015-12-18: 1
  Administered 2015-12-20: 18:00:00

## 2015-12-17 MED ORDER — METHYLPREDNISOLONE SODIUM SUCC 40 MG IJ SOLR
40.0000 mg | Freq: Every day | INTRAMUSCULAR | Status: DC
  Administered 2015-12-18 – 2015-12-19 (×2): 40 mg via INTRAVENOUS
  Filled 2015-12-17 (×3): qty 1

## 2015-12-17 MED ORDER — OMEGA-3-ACID ETHYL ESTERS 1 G PO CAPS
1.0000 g | ORAL_CAPSULE | Freq: Every day | ORAL | Status: DC
  Administered 2015-12-18 – 2015-12-20 (×3): 1 g via ORAL
  Filled 2015-12-17 (×3): qty 1

## 2015-12-17 MED ORDER — METHYLPREDNISOLONE 4 MG PO TBPK: ORAL_TABLET | ORAL | 0 refills | Status: DC

## 2015-12-17 MED ORDER — ONDANSETRON HCL 4 MG PO TABS: 4.0000 mg | ORAL_TABLET | Freq: Four times a day (QID) | ORAL | Status: DC | PRN

## 2015-12-17 MED ORDER — ONDANSETRON HCL 4 MG/2ML IJ SOLN: 4.0000 mg | Freq: Four times a day (QID) | INTRAMUSCULAR | Status: DC | PRN

## 2015-12-17 NOTE — ED Notes (Signed)
Nurse drawing labs. 

## 2015-12-17 NOTE — Telephone Encounter (Signed)
Called [name] - informed rx for steroids was sent.   [name] stated that pt has been taken to ED. One of the dtr saw that pill sorter/dispenser has meds for 9 days ago.

## 2015-12-17 NOTE — Progress Notes (Signed)
ANTICOAGULATION CONSULT NOTE - Initial Consult  Pharmacy Consult for Coumadin Indication: atrial fibrillation  No Known Allergies  Patient Measurements: Height: 5\' 11"  (180.3 cm) Weight: 160 lb (72.6 kg) IBW/kg (Calculated) : 75.3  Vital Signs: Temp: 99.3 F (37.4 C) (10/03 2202) Temp Source: Oral (10/03 1656) BP: 167/75 (10/03 2202) Pulse Rate: 102 (10/03 2202)  Labs:  Recent Labs  12/17/15 1835  HGB 10.7*  HCT 33.9*  PLT 161  LABPROT 20.5*  INR 1.73  CREATININE 1.32*    Estimated Creatinine Clearance: 38.2 mL/min (by C-G formula based on SCr of 1.32 mg/dL (H)).   Medical History: Past Medical History:  Diagnosis Date  . Arthritis    "right wrist and hand" (02/25/2012)  . Atrial fibrillation ([place])   . Carotid artery occlusion   . Diverticula of colon   . Enlarged prostate   . Hypertension   . Knee pain, right    "twisted it couple days ago; pain getting worse since" (02/25/2012)  . Skin cancer of face     Medications:  Prescriptions Prior to Admission  Medication Sig Dispense Refill Last Dose  . allopurinol (ZYLOPRIM) 100 MG tablet Take 1 tablet (100 mg total) by mouth daily. 30 tablet 11 12/17/2015 at Unknown time  . amLODipine (NORVASC) 5 MG tablet Take 5 mg by mouth daily.   12/17/2015 at Unknown time  . atenolol (TENORMIN) 25 MG tablet Take 25 mg by mouth daily.   12/17/2015 at 0800  . diclofenac sodium (VOLTAREN) 1 % GEL Apply 2 g topically 4 (four) times daily. 100 g 11 12/16/2015 at Unknown time  . digoxin (LANOXIN) 0.125 MG tablet Take 1 tablet (0.125 mg total) by mouth daily. 90 tablet 3 12/17/2015 at Unknown time  . MULTIPLE VITAMINS PO Take   1 tablet daily   Past Week at Unknown time  . Omega-3 Fatty Acids (FISH OIL) 1000 MG CAPS Take 1,000 mg by mouth daily.   12/17/2015 at Unknown time  . quinapril (ACCUPRIL) 10 MG tablet Take 10 mg by mouth daily.   12/17/2015 at Unknown time  . Tamsulosin HCl (FLOMAX) 0.4 MG CAPS Take 0.4 mg by mouth daily.    12/17/2015 at Unknown time  . warfarin (COUMADIN) 5 MG tablet Take 2.5-5 mg by mouth daily. Take 2.5mg  on Monday and Fridays, then take 5mg  rest of days   12/17/2015 at 1200  . colchicine 0.6 MG tablet Take 1 tablet (0.6 mg total) by mouth daily. 90 tablet 0 unknown at unknown  . methylPREDNISolone (MEDROL DOSEPAK) 4 MG TBPK tablet TAKE AS DIRECTED (Patient not taking: Reported on 12/17/2015) 21 tablet 0 Not Taking at Unknown time  . warfarin (COUMADIN) 5 MG tablet Take as directed by coumadin clinic (Patient not taking: Reported on 12/17/2015) 135 tablet 0 Not Taking    Assessment: 82 y.o. male admitted with LE swelling, h/o Afib, to continue Coumadin.  INR subtherapeutic today.    Goal of Therapy:  INR 2-3 Monitor platelets by anticoagulation protocol: Yes   Plan:  Coumadin 2.5 mg tonight (in addition to dose taken earlier today at home) Daily INR  Carmel Garfield, Bronson Curb 12/17/2015,11:41 PM

## 2015-12-17 NOTE — Telephone Encounter (Signed)
Called pt dtr [name]).   She states that pt has been allopurinol and has been taking it daily. She also states that he started the colchicine today also.   Please advise if there is an additional rx that can be sent.

## 2015-12-17 NOTE — Telephone Encounter (Signed)
Pts daughter called and patient has gout in his foot. He is laying in bed in a lot of pain. She is wondering if Dr Ronnald Ramp can write him a prescription. She is wondering what to do. He is in a lot of pain and last time they took him to the ER for this they didn't do much for him. Please advise thanks.

## 2015-12-17 NOTE — ED Provider Notes (Signed)
[facility] DEPT Provider Note   CSN: [id number] Arrival date & time: 12/17/15  1638     History   Chief Complaint No chief complaint on file.   HPI [name] is a 82 y.o. male.  HPI [name] is a 82 y.o. male with history of arthritis, atrial fibrillation, hypertension, presents to emergency department with complaint of pain. Patient has history of gout. He states that he has not taken his medications in the last 9 days because he "I guess did not remember." Patient lives alone at home. Patient's daughters help him with groceries and help him sort out his pill box. One of patient's daughters apparently forgot to put his gout medication and his pills for this week, and patient stated he just did not take any of them. He states that he is having pain to his left foot, left knee, right hand. The pain was so severe this morning the patient could not sit up or stand up out of his bed. Patient normally walks with a walker and is able to sit, stand, do his daily activities himself. Patient is not taking any for pain. He normally takes colchicine and allopurinol for his gout. Pt denies fever or chills. He denies any injuries. No other complaints.   Past Medical History:  Diagnosis Date  . Arthritis    "right wrist and hand" (02/25/2012)  . Atrial fibrillation ([place])   . Carotid artery occlusion   . Diverticula of colon   . Enlarged prostate   . Hypertension   . Knee pain, right    "twisted it couple days ago; pain getting worse since" (02/25/2012)  . Skin cancer of face     Patient Active Problem List   Diagnosis Date Noted  . Acute idiopathic gout of right wrist 08/07/2015  . Aneurysm ([place]) 07/11/2015  . 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 Encounter for therapeutic drug monitoring 10/30/2014  . Mitral valve disorder 08/08/2013  . Carotid artery disease ([place]) 06/06/2013  . Atrial fibrillation ([place]) 02/24/2012  . HTN (hypertension) 02/24/2012    . Kidney disease, chronic, stage III (GFR 30-59 ml/min) 02/24/2012  . Deficiency anemia 02/24/2012    Past Surgical History:  Procedure Laterality Date  . CAROTID ENDARTERECTOMY  12/02/2009   RIGHT  cea  . COLON RESECTION  ~ 2010  . INGUINAL HERNIA REPAIR  ~ 2011   "left" (02/25/2012)  . SKIN CANCER EXCISION     "q once in awhile; on my face" (02/25/2012)  . TOTAL HIP ARTHROPLASTY  1980's   "right" (02/25/2012)       Home Medications    Prior to Admission medications   Medication Sig Start Date End Date Taking? Authorizing Provider  allopurinol (ZYLOPRIM) 100 MG tablet Take 1 tablet (100 mg total) by mouth daily. 08/07/15   Janith Lima, MD  amLODipine (NORVASC) 5 MG tablet Take 5 mg by mouth daily.    Historical Provider, MD  atenolol (TENORMIN) 25 MG tablet Take 25 mg by mouth daily.    Historical Provider, MD  colchicine 0.6 MG tablet Take 1 tablet (0.6 mg total) by mouth daily. 11/14/15   Janith Lima, MD  diclofenac sodium (VOLTAREN) 1 % GEL Apply 2 g topically 4 (four) times daily. 05/08/15   Janith Lima, MD  digoxin (LANOXIN) 0.125 MG tablet Take 1 tablet (0.125 mg total) by mouth daily. 04/25/15   Dorothy Spark, MD  methylPREDNISolone (MEDROL DOSEPAK) 4 MG TBPK tablet  TAKE AS DIRECTED 12/17/15   Janith Lima, MD  MULTIPLE VITAMINS PO Take   1 tablet daily    Historical Provider, MD  Omega-3 Fatty Acids (FISH OIL) 1000 MG CAPS Take 1,000 mg by mouth daily.    Historical Provider, MD  quinapril (ACCUPRIL) 10 MG tablet Take 10 mg by mouth daily.    Historical Provider, MD  Tamsulosin HCl (FLOMAX) 0.4 MG CAPS Take 0.4 mg by mouth daily.    Historical Provider, MD  warfarin (COUMADIN) 5 MG tablet Take as directed by coumadin clinic 12/05/15   Janith Lima, MD    Family History Family History  Problem Relation Age of Onset  . Heart disease Father     Heart Disease before age 14  . Heart attack Father   . Healthy Mother   . Hypertension Brother   . Heart attack  Brother   . Hypertension Brother   . Stroke Brother   . Heart disease Brother   . Hypertension Brother   . Stroke Brother   . Heart disease Brother   . Cancer Sister     Social History Social History  Substance Use Topics  . Smoking status: Former Smoker    Packs/day: 1.00    Years: 33.00    Types: Cigarettes    Quit date: 03/16/1978  . Smokeless tobacco: Never Used     Comment: 02/25/2012 "quit smoking in 1980"  . Alcohol use No     Allergies   Review of patient's allergies indicates no known allergies.   Review of Systems Review of Systems  Constitutional: Negative for chills and fever.  Respiratory: Negative for cough, chest tightness and shortness of breath.   Cardiovascular: Negative for chest pain, palpitations and leg swelling.  Gastrointestinal: Negative for abdominal distention, abdominal pain, diarrhea, nausea and vomiting.  Genitourinary: Negative for dysuria, frequency, hematuria and urgency.  Musculoskeletal: Positive for arthralgias and joint swelling. Negative for myalgias, neck pain and neck stiffness.  Skin: Negative for rash.  Allergic/Immunologic: Negative for immunocompromised state.  Neurological: Positive for weakness. Negative for dizziness, light-headedness, numbness and headaches.  All other systems reviewed and are negative.    Physical Exam Updated Vital Signs BP 194/82 (BP Location: Right Arm)   Pulse 102   Temp 98.3 F (36.8 C) (Oral)   Resp 20   Ht 5\' 11"  (1.803 m)   Wt 79.4 kg   SpO2 95%   BMI 24.41 kg/m   Physical Exam  Constitutional: He appears well-developed and well-nourished. No distress.  HENT:  Head: Normocephalic and atraumatic.  Eyes: Conjunctivae are normal.  Neck: Neck supple.  Cardiovascular: Normal rate and normal heart sounds.   Irregular rhythm  Pulmonary/Chest: Effort normal. No respiratory distress. He has no wheezes. He has no rales.  Abdominal: Soft. Bowel sounds are normal. He exhibits no distension.  There is no tenderness. There is no rebound.  Musculoskeletal: He exhibits no edema.  Swelling and erythema noted to the left foot and left ankle. Diffuse tenderness to palpation. Dorsal pedal pulses are intact and equal bilaterally. Swelling and pain he should over entire left knee. Pain with any range of motion of the ankle or left knee joint. Joint is not red or warm. Normal left hand and left wrist. Swelling noted to the right dorsal wrist. Pain with any range of motion of the wrist or fingers. Distal radial pulses intact.  Neurological: He is alert.  Skin: Skin is warm and dry. Capillary refill takes less than 2  seconds.  Nursing note and vitals reviewed.    ED Treatments / Results  Labs (all labs ordered are listed, but only abnormal results are displayed) Labs Reviewed  CBC WITH DIFFERENTIAL/PLATELET - Abnormal; Notable for the following:       Result Value   WBC 10.9 (*)    RBC 3.89 (*)    Hemoglobin 10.7 (*)    HCT 33.9 (*)    RDW 16.1 (*)    Neutro Abs 9.3 (*)    Lymphs Abs 0.6 (*)    Monocytes Absolute 1.1 (*)    All other components within normal limits  BASIC METABOLIC PANEL - Abnormal; Notable for the following:    Chloride 112 (*)    Glucose, Bld 117 (*)    BUN 27 (*)    Creatinine, Ser 1.32 (*)    GFR calc non Af Amer 46 (*)    GFR calc Af Amer 53 (*)    Anion gap 3 (*)    All other components within normal limits  PROTIME-INR - Abnormal; Notable for the following:    Prothrombin Time 20.5 (*)    All other components within normal limits  URIC ACID  URINALYSIS, ROUTINE W REFLEX MICROSCOPIC (NOT AT [facility])  DIGOXIN LEVEL    EKG  EKG Interpretation  Date/Time:  Tuesday December 17 2015 16:57:49 EDT Ventricular Rate:  93 PR Interval:    QRS Duration: 108 QT Interval:  343 QTC Calculation: 427 R Axis:   1 Text Interpretation:  Atrial fibrillation Abnormal R-wave progression, late transition Repol abnrm suggests ischemia, diffuse leads Borderline ST  elevation, anterior leads Baseline wander in lead(s) V1 V2 V3 pt is tremulous Confirmed by HAVILAND MD, JULIE ([id number]) on 12/17/2015 5:07:40 PM       Radiology No results found.  Procedures Procedures (including critical care time)  Medications Ordered in ED Medications  fentaNYL (SUBLIMAZE) injection 25 mcg (not administered)  ondansetron (ZOFRAN) injection 4 mg (not administered)     Initial Impression / Assessment and Plan / ED Course  I have reviewed the triage vital signs and the nursing notes.  Pertinent labs & imaging results that were available during my care of the patient were reviewed by me and considered in my medical decision making (see chart for details).  Clinical Course    Patient in emergency department with what he states his gout exacerbation. He is complaining of pain and swelling to left ankle, left knee, right hand. Patient does have significant swelling to the left ankle and left knee. He is afebrile, otherwise nontoxic appearing. Patient has not had any his Medicaid medications in 9 days. He is hypertensive in ED. He appears to be in A. fib. Rate controlled at this time. Will check labs, uric acid, pain medications ordered.   7:45 PM Pt feels better. I spoke with [facility], will admit for pain management and multi joint gout flare. Asked to get digoxin level, give solumedrol, give cardizem dose. I verified with family, pt did actually take his cardizem and all his medications this morning. Will hold off  Final Clinical Impressions(s) / ED Diagnoses   Final diagnoses:  Acute gout of multiple sites, unspecified cause    New Prescriptions New Prescriptions   No medications on file     Jeannett Senior, PA-C 12/17/15 1955    Isla Pence, MD 12/17/15 2007

## 2015-12-17 NOTE — Telephone Encounter (Signed)
RX for steroids sent

## 2015-12-17 NOTE — H&P (Signed)
History and Physical    [name] [id number] DOB: [birth year] DOA: 12/17/2015  PCP: Scarlette Calico, MD  Patient coming from: Home.  Chief Complaint: Joint pain and difficulty walking.  HPI: [name] is a 82 y.o. male with history of gout, persistent atrial fibrillation, hypertension, chronic anemia and chronic kidney disease was brought to the ER to patient had worsening joint pains involving the both wrist left knee and ankle pain on trying to walk. Patient states his pillbox was not properly failed and he missed his gout medications including allopurinol and colchicine for last 9 days. Patient started having severe pain last 2 days making difficult to walk. On exam patient has swelling of the wrist and left knee and patient also states ankle pain on trying to walk. Patient does not have any signs of infection. Symptoms are consistent with polyarticular acute gouty arthritis. Since patient has difficulty walking and pain will be admitted for further management.   ED Course: Solu-Medrol 125 mg IV was given to the ER.  Review of Systems: As per HPI, rest all negative.   Past Medical History:  Diagnosis Date  . Arthritis    "right wrist and hand" (02/25/2012)  . Atrial fibrillation ([place])   . Carotid artery occlusion   . Diverticula of colon   . Enlarged prostate   . Hypertension   . Knee pain, right    "twisted it couple days ago; pain getting worse since" (02/25/2012)  . Skin cancer of face     Past Surgical History:  Procedure Laterality Date  . CAROTID ENDARTERECTOMY  12/02/2009   RIGHT  cea  . COLON RESECTION  ~ 2010  . INGUINAL HERNIA REPAIR  ~ 2011   "left" (02/25/2012)  . SKIN CANCER EXCISION     "q once in awhile; on my face" (02/25/2012)  . TOTAL HIP ARTHROPLASTY  1980's   "right" (02/25/2012)     reports that he quit smoking about 37 years ago. His smoking use included Cigarettes. He has a 33.00 pack-year smoking history. He has never used smokeless  tobacco. He reports that he does not drink alcohol or use drugs.  No Known Allergies  Family History  Problem Relation Age of Onset  . Heart disease Father     Heart Disease before age 82  . Heart attack Father   . Healthy Mother   . Hypertension Brother   . Heart attack Brother   . Hypertension Brother   . Stroke Brother   . Heart disease Brother   . Hypertension Brother   . Stroke Brother   . Heart disease Brother   . Cancer Sister     Prior to Admission medications   Medication Sig Start Date End Date Taking? Authorizing Provider  allopurinol (ZYLOPRIM) 100 MG tablet Take 1 tablet (100 mg total) by mouth daily. 08/07/15  Yes Janith Lima, MD  amLODipine (NORVASC) 5 MG tablet Take 5 mg by mouth daily.   Yes Historical Provider, MD  atenolol (TENORMIN) 25 MG tablet Take 25 mg by mouth daily.   Yes Historical Provider, MD  diclofenac sodium (VOLTAREN) 1 % GEL Apply 2 g topically 4 (four) times daily. 05/08/15  Yes Janith Lima, MD  digoxin (LANOXIN) 0.125 MG tablet Take 1 tablet (0.125 mg total) by mouth daily. 04/25/15  Yes Dorothy Spark, MD  MULTIPLE VITAMINS PO Take   1 tablet daily   Yes Historical Provider, MD  Omega-3 Fatty Acids (FISH OIL) 1000 MG  CAPS Take 1,000 mg by mouth daily.   Yes Historical Provider, MD  quinapril (ACCUPRIL) 10 MG tablet Take 10 mg by mouth daily.   Yes Historical Provider, MD  Tamsulosin HCl (FLOMAX) 0.4 MG CAPS Take 0.4 mg by mouth daily.   Yes Historical Provider, MD  warfarin (COUMADIN) 5 MG tablet Take 2.5-5 mg by mouth daily. Take 2.5mg  on Monday and Fridays, then take 5mg  rest of days   Yes Historical Provider, MD  colchicine 0.6 MG tablet Take 1 tablet (0.6 mg total) by mouth daily. 11/14/15   Janith Lima, MD  methylPREDNISolone (MEDROL DOSEPAK) 4 MG TBPK tablet TAKE AS DIRECTED Patient not taking: Reported on 12/17/2015 12/17/15   Janith Lima, MD  warfarin (COUMADIN) 5 MG tablet Take as directed by coumadin clinic Patient not  taking: Reported on 12/17/2015 12/05/15   Janith Lima, MD    Physical Exam: Vitals:   12/17/15 1930 12/17/15 2015 12/17/15 2100 12/17/15 2202  BP: 175/78 167/65 166/59 (!) 167/75  Pulse: 112 111 108 (!) 102  Resp: (!) 35 (!) 41 25 (!) 22  Temp:    99.3 F (37.4 C)  TempSrc:      SpO2: 95% 96% 97% 96%  Weight:    160 lb (72.6 kg)  Height:    5\' 11"  (1.803 m)      Constitutional: Moderately built and nourished. Vitals:   12/17/15 1930 12/17/15 2015 12/17/15 2100 12/17/15 2202  BP: 175/78 167/65 166/59 (!) 167/75  Pulse: 112 111 108 (!) 102  Resp: (!) 35 (!) 41 25 (!) 22  Temp:    99.3 F (37.4 C)  TempSrc:      SpO2: 95% 96% 97% 96%  Weight:    160 lb (72.6 kg)  Height:    5\' 11"  (1.803 m)   Eyes: Anicteric no pallor. ENMT: No discharge from the ears eyes nose or mouth. Neck: No mass felt. No neck rigidity no JVD. Respiratory: No rhonchi or crepitations. Cardiovascular: S1-S2 heard. Abdomen: Soft nontender bowel sounds present. No guarding or rigidity. Musculoskeletal: Swelling of the both wrist left knee.  Skin: No erythema or rash. Neurologic: Moves all extremities 5 x 5. No facial asymmetry. Psychiatric: Alert awake oriented to time place and person. Moves all extremities.   Labs on Admission: I have personally reviewed following labs and imaging studies  CBC:  Recent Labs Lab 12/17/15 1835  WBC 10.9*  NEUTROABS 9.3*  HGB 10.7*  HCT 33.9*  MCV 87.1  PLT [id number]   Basic Metabolic Panel:  Recent Labs Lab 12/17/15 1835  NA 140  K 4.2  CL 112*  CO2 25  GLUCOSE 117*  BUN 27*  CREATININE 1.32*  CALCIUM 9.9   GFR: Estimated Creatinine Clearance: 38.2 mL/min (by C-G formula based on SCr of 1.32 mg/dL (H)). Liver Function Tests: No results for input(s): AST, ALT, ALKPHOS, BILITOT, PROT, ALBUMIN in the last 168 hours. No results for input(s): LIPASE, AMYLASE in the last 168 hours. No results for input(s): AMMONIA in the last 168 hours. Coagulation  Profile:  Recent Labs Lab 12/17/15 1835  INR 1.73   Cardiac Enzymes: No results for input(s): CKTOTAL, CKMB, CKMBINDEX, TROPONINI in the last 168 hours. BNP (last 3 results) No results for input(s): PROBNP in the last 8760 hours. HbA1C: No results for input(s): HGBA1C in the last 72 hours. CBG: No results for input(s): GLUCAP in the last 168 hours. Lipid Profile: No results for input(s): CHOL, HDL, LDLCALC, TRIG, CHOLHDL, LDLDIRECT  in the last 72 hours. Thyroid Function Tests: No results for input(s): TSH, T4TOTAL, FREET4, T3FREE, THYROIDAB in the last 72 hours. Anemia Panel: No results for input(s): VITAMINB12, FOLATE, FERRITIN, TIBC, IRON, RETICCTPCT in the last 72 hours. Urine analysis:    Component Value Date/Time   COLORURINE YELLOW 07/08/2015 1140   APPEARANCEUR CLOUDY (A) 07/08/2015 1140   LABSPEC 1.019 07/08/2015 1140   PHURINE 6.5 07/08/2015 1140   GLUCOSEU NEGATIVE 07/08/2015 1140   HGBUR MODERATE (A) 07/08/2015 1140   BILIRUBINUR NEGATIVE 07/08/2015 1140   KETONESUR NEGATIVE 07/08/2015 1140   PROTEINUR 100 (A) 07/08/2015 1140   UROBILINOGEN 1.0 08/06/2012 1637   NITRITE NEGATIVE 07/08/2015 1140   LEUKOCYTESUR LARGE (A) 07/08/2015 1140   Sepsis Labs: @LABRCNTIP (procalcitonin:4,lacticidven:4) )No results found for this or any previous visit (from the past 240 hour(s)).   Radiological Exams on Admission: No results found.  EKG: Independently reviewed. A. fib rate controlled at 90 bpm.  Assessment/Plan Principal Problem:   Acute gout of multiple sites Active Problems:   Atrial fibrillation (HCC)   HTN (hypertension)   Kidney disease, chronic, stage III (GFR 30-59 ml/min)   Gout    1. Acute gouty arthritis involving multiple joints - at this time I have placed patient on Solu-Medrol 40 mg IV daily and restart colchicine. Will hold off allopurinol until acute gouty attack subsides. Obtain physical therapy consult. Check uric acid levels. Patient does not  have any signs of septic arthritis. 2. Atrial fibrillation presently rate controlled - on Coumadin and digoxin and beta blockers. Chads 2 vasc score is more than 2. Check digoxin level. 3. Hypertension on Norvasc and Accupril. Since blood pressure is mildly elevated will keep patient on when necessary IV hydralazine. 4. Chronic disease stage II - creatinine appears to be at baseline. 5. Chronic anemia - follow CBC.  Since patient has multiple joints involved with acute gouty arthritis patient will find it difficult to ambulate with severe pain and thus patient had been admitted inpatient with IV steroids and colchicine and physical therapy consult.   DVT prophylaxis: Lovenox. Code Status: Full code.  Family Communication: Discussed with patient.  Disposition Plan: Home.  Consults called: Physical therapy.  Admission status: Inpatient. Likely stay 2 days.    Rise Patience MD [facility] Pager [phone].  If 7PM-7AM, please contact night-coverage [email or web address] Password [email or web address]  12/17/2015, 11:31 PM

## 2015-12-18 DIAGNOSIS — M1000 Idiopathic gout, unspecified site: Secondary | ICD-10-CM

## 2015-12-18 DIAGNOSIS — N183 Chronic kidney disease, stage 3 (moderate): Secondary | ICD-10-CM

## 2015-12-18 DIAGNOSIS — I481 Persistent atrial fibrillation: Secondary | ICD-10-CM

## 2015-12-18 DIAGNOSIS — M109 Gout, unspecified: Secondary | ICD-10-CM

## 2015-12-18 LAB — COMPREHENSIVE METABOLIC PANEL
ALK PHOS: 41 U/L (ref 38–126)
ALT: 11 U/L — AB (ref 17–63)
ANION GAP: 5 (ref 5–15)
AST: 20 U/L (ref 15–41)
Albumin: 2.8 g/dL — ABNORMAL LOW (ref 3.5–5.0)
BUN: 36 mg/dL — ABNORMAL HIGH (ref 6–20)
CALCIUM: 9.6 mg/dL (ref 8.9–10.3)
CHLORIDE: 110 mmol/L (ref 101–111)
CO2: 22 mmol/L (ref 22–32)
CREATININE: 1.45 mg/dL — AB (ref 0.61–1.24)
GFR, EST AFRICAN AMERICAN: 47 mL/min — AB (ref >60)
GFR, EST NON AFRICAN AMERICAN: 41 mL/min — AB (ref >60)
Glucose, Bld: 165 mg/dL — ABNORMAL HIGH (ref 65–99)
Potassium: 4.4 mmol/L (ref 3.5–5.1)
Sodium: 137 mmol/L (ref 135–145)
Total Bilirubin: 1.1 mg/dL (ref 0.3–1.2)
Total Protein: 6.7 g/dL (ref 6.5–8.1)

## 2015-12-18 LAB — CBC WITH DIFFERENTIAL/PLATELET
BASOS ABS: 0 10*3/uL (ref 0.0–0.1)
BASOS PCT: 0 %
EOS ABS: 0 10*3/uL (ref 0.0–0.7)
Eosinophils Relative: 0 %
HCT: 30.7 % — ABNORMAL LOW (ref 39.0–52.0)
HEMOGLOBIN: 9.9 g/dL — AB (ref 13.0–17.0)
Lymphocytes Relative: 5 %
Lymphs Abs: 0.5 10*3/uL — ABNORMAL LOW (ref 0.7–4.0)
MCH: 28.1 pg (ref 26.0–34.0)
MCHC: 32.2 g/dL (ref 30.0–36.0)
MCV: 87.2 fL (ref 78.0–100.0)
Monocytes Absolute: 0.2 10*3/uL (ref 0.1–1.0)
Monocytes Relative: 2 %
NEUTROS PCT: 93 %
Neutro Abs: 8.7 10*3/uL — ABNORMAL HIGH (ref 1.7–7.7)
Platelets: 155 10*3/uL (ref 150–400)
RBC: 3.52 MIL/uL — AB (ref 4.22–5.81)
RDW: 16.2 % — AB (ref 11.5–15.5)
WBC: 9.4 10*3/uL (ref 4.0–10.5)

## 2015-12-18 LAB — PROTIME-INR
INR: 1.83
PROTHROMBIN TIME: 21.4 s — AB (ref 11.4–15.2)

## 2015-12-18 LAB — DIGOXIN LEVEL: DIGOXIN LVL: 1 ng/mL (ref 0.8–2.0)

## 2015-12-18 LAB — URIC ACID: Uric Acid, Serum: 6.6 mg/dL (ref 4.4–7.6)

## 2015-12-18 MED ORDER — HYDRALAZINE HCL 20 MG/ML IJ SOLN: 10.0000 mg | INTRAMUSCULAR | Status: DC | PRN

## 2015-12-18 MED ORDER — WARFARIN SODIUM 7.5 MG PO TABS
7.5000 mg | ORAL_TABLET | Freq: Once | ORAL | Status: DC
Start: 2015-12-18 — End: 2015-12-19
  Filled 2015-12-18: qty 1

## 2015-12-18 NOTE — Evaluation (Signed)
Physical Therapy Evaluation Patient Details Name: [name] MRN: [id number] DOB: [birth year] Today's Date: 12/18/2015   History of Present Illness  [name] is a 82 y.o. male with history of gout, persistent atrial fibrillation, hypertension, chronic anemia and chronic kidney disease was brought to the ER to patient had worsening joint pains involving the both wrist left knee and ankle pain on trying to walk. Patient states his pillbox was not properly failed and he missed his gout medications including allopurinol and colchicine for last 9 Burns. Patient started having severe pain last 2 Burns making difficult to walk  Clinical Impression   Pt admitted with above diagnosis. Pt currently with functional limitations due to the deficits listed below (see PT Problem List).  Pt will benefit from skilled PT to increase their independence and safety with mobility to allow discharge to the venue listed below.    My hope is that when Mr. Cater' gout flare is over, he will be back to normal baseline, and will be able to dc home with possible HH therapies; Often when the flare is over this occurs; We discussed that if he is still having difficult;y walking when it is time to leave the hospital, will recommend SNF for rehab -- pt voices understanding.     Follow Up Recommendations SNF;Other (comment)    Equipment Recommendations  Rolling walker with 5" wheels;3in1 (PT);Wheelchair (measurements PT)    Recommendations for Other Services OT consult     Precautions / Restrictions Precautions Precautions: Fall Precaution Comments: Acute gout -- does not tolerate light touch to L foot/ankle      Mobility  Bed Mobility Overal bed mobility: Needs Assistance;+2 for physical assistance Bed Mobility: Supine to Sit     Supine to sit: HOB elevated;+2 for safety/equipment;+2 for physical assistance;Mod assist     General bed mobility comments: Cues for technqiue to sit up in prep for an Anterior  Posterior transfer; used HOB elevated to help trunk to sitting; REquired trunk support as he turned his bakc to the chair  Transfers Overall transfer level: Needs assistance Equipment used:  (bed pad) Transfers: Comptroller transfers: +2 physical assistance;Max assist   General transfer comment: Max assist to slide backwards into chair; good reach back and use of LUE to pull; cues to sit fully upright, and anterior pelvic tile instead of posterior tilt of pelvis  Ambulation/Gait                Stairs            Wheelchair Mobility    Modified Rankin (Stroke Patients Only)       Balance                                             Pertinent Vitals/Pain Pain Assessment: 0-10 Pain Score: 10-Worst pain ever Pain Location: L foot and ankle with light touch Pain Descriptors / Indicators: Crying;Sharp Pain Intervention(s): Limited activity within patient's tolerance;Monitored during session;Other (comment) (used pillow to suport L foot and ankle with moving)    Home Living Family/patient expects to be discharged to:: Private residence Living Arrangements: Children (Son and grandson; will need to verify this) Available Help at Discharge: Available PRN/intermittently Type of Home: House Home Access: Stairs to enter   [employer] of Steps: 1 Home Layout: One level Home Equipment:  Walker - 2 wheels;Cane - single point      Prior Function Level of Independence: Independent with assistive device(s)               Hand Dominance        Extremity/Trunk Assessment   Upper Extremity Assessment: RUE deficits/detail RUE Deficits / Details: Gout flare in R wrist; did not fully assess wrist and hand range and function due to acute pain RUE: Unable to fully assess due to pain       Lower Extremity Assessment: Generalized weakness;RLE deficits/detail;LLE deficits/detail RLE Deficits /  Details: [name] was hesitant to move R LE at all due to anticipation of pain; while in session he did not specify a specific gout flare in RLE, he still declined to move RLE very much LLE Deficits / Details: Exquisite pain L foot and ankle due to acute gout flare; edema noted; suported L lower leg and foot on pillow while moving     Communication   Communication: HOH  Cognition Arousal/Alertness: Awake/alert Behavior During Therapy: WFL for tasks assessed/performed Overall Cognitive Status: Within Functional Limits for tasks assessed                      General Comments      Exercises     Assessment/Plan    PT Assessment Patient needs continued PT services  PT Problem List Decreased strength;Decreased range of motion;Decreased activity tolerance;Decreased balance;Decreased mobility;Decreased knowledge of use of DME;Decreased knowledge of precautions;Pain          PT Treatment Interventions DME instruction;Gait training;Functional mobility training;Stair training;Therapeutic activities;Therapeutic exercise;Balance training;Patient/family education;Wheelchair mobility training    PT Goals (Current goals can be found in the Care Plan section)  Acute Rehab PT Goals Patient Stated Goal: gout flare to be over PT Goal Formulation: With patient Time For Goal Achievement: 01/01/16 Potential to Achieve Goals: Good    Frequency Min 3X/week   Barriers to discharge   Will need a clearer picture of exactly how much assist is available to pt at home    Co-evaluation               End of Session Equipment Utilized During Treatment:  (bed pad) Activity Tolerance: Patient tolerated treatment well Patient left: in chair;with call bell/phone within reach Nurse Communication: Mobility status;Need for lift equipment;Other (comment) (perhaps pivot on RLE, or ant/post back to bed)         Time: [id number] PT Time Calculation (min) (ACUTE ONLY): 29 min   Charges:   PT  Evaluation $PT Eval Moderate Complexity: 1 Procedure PT Treatments $Therapeutic Activity: 23-37 mins   PT G Codes:        Roney Marion Hamff 12/18/2015, 11:35 AM  Roney Marion, PT  Acute Rehabilitation Services Pager [phone] Office [phone]

## 2015-12-18 NOTE — Progress Notes (Signed)
Unable to complete admission history at this time due to patient being very hard of hearing. His son had brought him batteries for his hearing aid earlier today but he refused to use them because he already had an open box at home. Communication is very limited at this time. Will have to defer completing  admission history.

## 2015-12-18 NOTE — Progress Notes (Signed)
CMT called that pt HR was in 160's non sustained and some text messages also HR in 140's patient was asymptomatic and bp was 145/65 Hr at this time 70 will continue to monitor. Arthor Captain LPN

## 2015-12-18 NOTE — Progress Notes (Signed)
[facility] inform of CMT message of patient tachycardia Arthor Captain Lpn

## 2015-12-18 NOTE — Progress Notes (Addendum)
PROGRESS NOTE    [name]  [id number] DOB: [birth year] DOA: 12/17/2015  PCP: Scarlette Calico, MD   Brief Narrative:  [name] is a 82 y.o. male with history of gout, persistent atrial fibrillation, hypertension, chronic anemia and chronic kidney disease stage 3was brought to the ER to patient had worsening joint pains involving the both wrist left knee and ankle pain on trying to walk. This has been going on for about 2 days now. Patient states his pillbox was not properly failed and he missed his gout medications including allopurinol and colchicine for last 9 days.  Subjective: Pain not yet improved. Has not tried to walk.   Assessment & Plan:   Principal Problem: Acute polyarticular gout - right wrist, left knees and ankle - cont IV Steroids for today - he understands that he will need to be more vigilant about taking his medications appropriately   Active Problems:   Atrial fibrillation - on Digoxin, Atenolol and Coumadin- INR subtherapeutic- pharmacy to dose    HTN (hypertension) - Norvasc, Atenolol, Lisinopril    Kidney disease, chronic, stage III (GFR 30-59 ml/min) - stable  BPH - Flomax   DVT prophylaxis: SCDs- cont Coumadin  Code Status: Full code Family Communication:  Disposition Plan: home in 1-2 days once able to ambulate Consultants:   none Procedures:    Antimicrobials:  Anti-infectives    None       Objective: Vitals:   12/17/15 2202 12/18/15 0516 12/18/15 0538 12/18/15 0900  BP: (!) 167/75 (!) 145/65 136/66 140/65  Pulse: (!) 102 70 82 85  Resp: (!) 22  18 18   Temp: 99.3 F (37.4 C)  98.5 F (36.9 C) 98.6 F (37 C)  TempSrc:    Oral  SpO2: 96%  94% 95%  Weight: 72.6 kg (160 lb)     Height: 5\' 11"  (1.803 m)       Intake/Output Summary (Last 24 hours) at 12/18/15 1620 Last data filed at 12/18/15 1526  Gross per 24 hour  Intake             1240 ml  Output              295 ml  Net              945 ml   Filed Weights   12/17/15 1645 12/17/15 2202  Weight: 79.4 kg (175 lb) 72.6 kg (160 lb)    Examination: General exam: Appears comfortable  HEENT: PERRLA, oral mucosa moist, no sclera icterus or thrush Respiratory system: Clear to auscultation. Respiratory effort normal. Cardiovascular system: S1 & S2 heard, RRR.  No murmurs  Gastrointestinal system: Abdomen soft, non-tender, nondistended. Normal bowel sound. No organomegaly Central nervous system: Alert and oriented. No focal neurological deficits. Extremities: No cyanosis, clubbing or edema- swelling and tenderness in right wrist, left knee and left ankle Skin: No rashes or ulcers Psychiatry:  Mood & affect appropriate.     Data Reviewed: I have personally reviewed following labs and imaging studies  CBC:  Recent Labs Lab 12/17/15 1835 12/18/15 0638  WBC 10.9* 9.4  NEUTROABS 9.3* 8.7*  HGB 10.7* 9.9*  HCT 33.9* 30.7*  MCV 87.1 87.2  PLT 161 [id number]   Basic Metabolic Panel:  Recent Labs Lab 12/17/15 1835 12/18/15 0638  NA 140 137  K 4.2 4.4  CL 112* 110  CO2 25 22  GLUCOSE 117* 165*  BUN 27* 36*  CREATININE 1.32* 1.45*  CALCIUM 9.9 9.6  GFR: Estimated Creatinine Clearance: 34.8 mL/min (by C-G formula based on SCr of 1.45 mg/dL (H)). Liver Function Tests:  Recent Labs Lab 12/18/15 0638  AST 20  ALT 11*  ALKPHOS 41  BILITOT 1.1  PROT 6.7  ALBUMIN 2.8*   No results for input(s): LIPASE, AMYLASE in the last 168 hours. No results for input(s): AMMONIA in the last 168 hours. Coagulation Profile:  Recent Labs Lab 12/17/15 1835 12/18/15 0638  INR 1.73 1.83   Cardiac Enzymes: No results for input(s): CKTOTAL, CKMB, CKMBINDEX, TROPONINI in the last 168 hours. BNP (last 3 results) No results for input(s): PROBNP in the last 8760 hours. HbA1C: No results for input(s): HGBA1C in the last 72 hours. CBG: No results for input(s): GLUCAP in the last 168 hours. Lipid Profile: No results for input(s): CHOL, HDL, LDLCALC,  TRIG, CHOLHDL, LDLDIRECT in the last 72 hours. Thyroid Function Tests: No results for input(s): TSH, T4TOTAL, FREET4, T3FREE, THYROIDAB in the last 72 hours. Anemia Panel: No results for input(s): VITAMINB12, FOLATE, FERRITIN, TIBC, IRON, RETICCTPCT in the last 72 hours. Urine analysis:    Component Value Date/Time   COLORURINE YELLOW 07/08/2015 1140   APPEARANCEUR CLOUDY (A) 07/08/2015 1140   LABSPEC 1.019 07/08/2015 1140   PHURINE 6.5 07/08/2015 1140   GLUCOSEU NEGATIVE 07/08/2015 1140   HGBUR MODERATE (A) 07/08/2015 1140   BILIRUBINUR NEGATIVE 07/08/2015 1140   KETONESUR NEGATIVE 07/08/2015 1140   PROTEINUR 100 (A) 07/08/2015 1140   UROBILINOGEN 1.0 08/06/2012 1637   NITRITE NEGATIVE 07/08/2015 1140   LEUKOCYTESUR LARGE (A) 07/08/2015 1140   Sepsis Labs: @LABRCNTIP (procalcitonin:4,lacticidven:4) )No results found for this or any previous visit (from the past 240 hour(s)).       Radiology Studies: No results found.    Scheduled Meds: . amLODipine  5 mg Oral Daily  . atenolol  25 mg Oral Daily  . colchicine  0.6 mg Oral Daily  . diclofenac sodium  2 g Topical QID  . digoxin  0.125 mg Oral Daily  . lisinopril  10 mg Oral Daily  . methylPREDNISolone (SOLU-MEDROL) injection  40 mg Intravenous Daily  . omega-3 acid ethyl esters  1 g Oral Daily  . tamsulosin  0.4 mg Oral Daily  . warfarin  7.5 mg Oral ONCE-1800  . Warfarin - Pharmacist Dosing Inpatient   Does not apply q1800   Continuous Infusions:    LOS: 1 day    Time spent in minutes: 56    [place], MD [facility] Pager: [email or web address] Password [email or web address] 12/18/2015, 4:20 PM

## 2015-12-18 NOTE — Progress Notes (Signed)
ANTICOAGULATION CONSULT NOTE - Initial Consult  Pharmacy Consult for Coumadin Indication: atrial fibrillation  No Known Allergies  Patient Measurements: Height: 5\' 11"  (180.3 cm) Weight: 160 lb (72.6 kg) IBW/kg (Calculated) : 75.3  Vital Signs: Temp: 98.6 F (37 C) (10/04 0900) Temp Source: Oral (10/04 0900) BP: 140/65 (10/04 0900) Pulse Rate: 85 (10/04 0900)  Labs:  Recent Labs  12/17/15 1835 12/18/15 0638  HGB 10.7* 9.9*  HCT 33.9* 30.7*  PLT 161 155  LABPROT 20.5* 21.4*  INR 1.73 1.83  CREATININE 1.32* 1.45*    Estimated Creatinine Clearance: 34.8 mL/min (by C-G formula based on SCr of 1.45 mg/dL (H)).   Medical History: Past Medical History:  Diagnosis Date  . Arthritis    "right wrist and hand" (02/25/2012)  . Atrial fibrillation ([place])   . Carotid artery occlusion   . Diverticula of colon   . Enlarged prostate   . Hypertension   . Knee pain, right    "twisted it couple days ago; pain getting worse since" (02/25/2012)  . Skin cancer of face     Medications:  Prescriptions Prior to Admission  Medication Sig Dispense Refill Last Dose  . allopurinol (ZYLOPRIM) 100 MG tablet Take 1 tablet (100 mg total) by mouth daily. 30 tablet 11 12/17/2015 at Unknown time  . amLODipine (NORVASC) 5 MG tablet Take 5 mg by mouth daily.   12/17/2015 at Unknown time  . atenolol (TENORMIN) 25 MG tablet Take 25 mg by mouth daily.   12/17/2015 at 0800  . diclofenac sodium (VOLTAREN) 1 % GEL Apply 2 g topically 4 (four) times daily. 100 g 11 12/16/2015 at Unknown time  . digoxin (LANOXIN) 0.125 MG tablet Take 1 tablet (0.125 mg total) by mouth daily. 90 tablet 3 12/17/2015 at Unknown time  . MULTIPLE VITAMINS PO Take   1 tablet daily   Past Week at Unknown time  . Omega-3 Fatty Acids (FISH OIL) 1000 MG CAPS Take 1,000 mg by mouth daily.   12/17/2015 at Unknown time  . quinapril (ACCUPRIL) 10 MG tablet Take 10 mg by mouth daily.   12/17/2015 at Unknown time  . Tamsulosin HCl (FLOMAX)  0.4 MG CAPS Take 0.4 mg by mouth daily.   12/17/2015 at Unknown time  . warfarin (COUMADIN) 5 MG tablet Take 2.5-5 mg by mouth daily. Take 2.5mg  on Monday and Fridays, then take 5mg  rest of days   12/17/2015 at 1200  . colchicine 0.6 MG tablet Take 1 tablet (0.6 mg total) by mouth daily. 90 tablet 0 unknown at unknown  . methylPREDNISolone (MEDROL DOSEPAK) 4 MG TBPK tablet TAKE AS DIRECTED (Patient not taking: Reported on 12/17/2015) 21 tablet 0 Not Taking at Unknown time  . warfarin (COUMADIN) 5 MG tablet Take as directed by coumadin clinic (Patient not taking: Reported on 12/17/2015) 135 tablet 0 Not Taking    Assessment: 82 y.o. male admitted with LE swelling, h/o Afib, to continue Coumadin. INR subtherapeutic at 1.83. Patient received a 2.5 mg dose this morning that was ordered overnight for subtherapeutic INR at time of admission. Clinic notes indicate home dose at 7.5mg  daily except on Mondays and Fridays take 5mg .    Goal of Therapy:  INR 2-3 Monitor platelets by anticoagulation protocol: Yes   Plan:  Coumadin 7.5mg  x1 Daily INR and CBC  Georga Bora, PharmD Clinical Pharmacist Pager: [phone] 12/18/2015 2:17 PM

## 2015-12-18 NOTE — Progress Notes (Signed)
Physical Therapy Treatment Patient Details Name: [name] MRN: [id number] DOB: December 23, 1923 Today's Date: 12/18/2015    History of Present Illness [name] is a 82 y.o. male with history of gout, persistent atrial fibrillation, hypertension, chronic anemia and chronic kidney disease was brought to the ER to patient had worsening joint pains involving the both wrist left knee and ankle pain on trying to walk. Patient states his pillbox was not properly failed and he missed his gout medications including allopurinol and colchicine for last 9 days. Patient started having severe pain last 2 days making difficult to walk    PT Comments    Performed basic stand pivot transfer on RLE back to bed from chair with +2 assist; this is promising and affords [name] more options for transfers and mobiltiy  Follow Up Recommendations  SNF     Equipment Recommendations  Rolling walker with 5" wheels;3in1 (PT);Wheelchair (measurements PT)    Recommendations for Other Services OT consult     Precautions / Restrictions Precautions Precautions: Fall Precaution Comments: Acute gout -- does not tolerate light touch to L foot/ankle    Mobility  Bed Mobility Overal bed mobility: Needs Assistance;+2 for physical assistance Bed Mobility: Sit to Supine       Sit to supine: Mod assist   General bed mobility comments: Mod assist to help LLE onto bed  Transfers Overall transfer level: Needs assistance Equipment used: 2 person hand held assist Transfers: Stand Pivot Transfers   Stand pivot transfers: +2 physical assistance;Max assist       General transfer comment: Transferred back to bed via stand pivot transfer on unaffected R leg; 2 person assist to stand and weight shift to pivot  Ambulation/Gait                 Stairs            Wheelchair Mobility    Modified Rankin (Stroke Patients Only)       Balance Overall balance assessment: Needs assistance           Standing balance-Leahy Scale: Zero                      Cognition Arousal/Alertness: Awake/alert Behavior During Therapy: WFL for tasks assessed/performed Overall Cognitive Status: Within Functional Limits for tasks assessed                      Exercises      General Comments        Pertinent Vitals/Pain Pain Assessment: 0-10 Pain Score: 10-Worst pain ever Pain Location: L foot and ankle; R wrist as well Pain Descriptors / Indicators: Sharp Pain Intervention(s): Limited activity within patient's tolerance    Home Living                      Prior Function            PT Goals (current goals can now be found in the care plan section) Acute Rehab PT Goals Patient Stated Goal: gout flare to be over PT Goal Formulation: With patient Time For Goal Achievement: 01/01/16 Potential to Achieve Goals: Good Progress towards PT goals: Progressing toward goals    Frequency    Min 3X/week      PT Plan Current plan remains appropriate    Co-evaluation             End of Session Equipment Utilized During Treatment: Gait belt Activity  Tolerance: Patient tolerated treatment well Patient left: in bed;with call bell/phone within reach;with bed alarm set     Time: 1435-1446 PT Time Calculation (min) (ACUTE ONLY): 11 min  Charges:  $Therapeutic Activity: 8-22 mins                    G Codes:      Quin Hoop 12/18/2015, 4:35 PM  Roney Marion, [place] Pager [phone] Office [phone]

## 2015-12-18 NOTE — Care Management Note (Signed)
Case Management Note  Patient Details  Name: GERAN LAUKAITIS MRN: [id number] Date of Birth: 1923/04/26  Subjective/Objective:    Acute polyarticular gout              Action/Plan: Discharge Planning:   NCM spoke to pt and dtr, Theodoro Parma [phone] and Lynnae Prude [phone]. Pt requesting SNF- rehab. Pt has RW, wheelchair, and cane at home. Does not have 3n1 bedside commode. Dtr states pt was having incontinence and difficulty with getting up to bathroom. CSW referral for SNF placement.   PCP-JONES, Arvid Right MD  Expected Discharge Date:               Expected Discharge Plan:  [place]  In-House Referral:  Clinical Social Work  Discharge planning Services  CM Consult  Post Acute Care Choice:  NA Choice offered to:  NA  DME Arranged:  N/A DME Agency:  NA  HH Arranged:  NA HH Agency:  NA  Status of Service:  Completed, signed off  If discussed at [place] of Stay Meetings, dates discussed:    Additional Comments:  Erenest Rasher, RN 12/18/2015, 6:06 PM

## 2015-12-19 DIAGNOSIS — I480 Paroxysmal atrial fibrillation: Secondary | ICD-10-CM

## 2015-12-19 DIAGNOSIS — M1009 Idiopathic gout, multiple sites: Secondary | ICD-10-CM

## 2015-12-19 LAB — CBC
HCT: 29.7 % — ABNORMAL LOW (ref 39.0–52.0)
Hemoglobin: 9.5 g/dL — ABNORMAL LOW (ref 13.0–17.0)
MCH: 27.2 pg (ref 26.0–34.0)
MCHC: 32 g/dL (ref 30.0–36.0)
MCV: 85.1 fL (ref 78.0–100.0)
PLATELETS: 165 10*3/uL (ref 150–400)
RBC: 3.49 MIL/uL — ABNORMAL LOW (ref 4.22–5.81)
RDW: 15.9 % — AB (ref 11.5–15.5)
WBC: 15.7 10*3/uL — AB (ref 4.0–10.5)

## 2015-12-19 LAB — PROTIME-INR
INR: 2.45
Prothrombin Time: 27 seconds — ABNORMAL HIGH (ref 11.4–15.2)

## 2015-12-19 MED ORDER — WARFARIN SODIUM 2.5 MG PO TABS
2.5000 mg | ORAL_TABLET | Freq: Once | ORAL | Status: DC
  Filled 2015-12-19: qty 1

## 2015-12-19 MED ORDER — PREDNISONE 20 MG PO TABS: 40.0000 mg | ORAL_TABLET | Freq: Every day | ORAL | 0 refills | Status: DC

## 2015-12-19 MED ORDER — PREDNISONE 20 MG PO TABS
40.0000 mg | ORAL_TABLET | Freq: Every day | ORAL | Status: DC
  Administered 2015-12-20: 40 mg via ORAL
  Filled 2015-12-19: qty 2

## 2015-12-19 NOTE — Discharge Instructions (Addendum)
Please take all your medications with you for your next visit with your Primary MD. Please request your Primary MD to go over all hospital test results at the follow up. Please ask your Primary MD to get all Hospital records sent to his/her office.  If you experience worsening of your admission symptoms, develop shortness of breath, chest pain, suicidal or homicidal thoughts or a life threatening emergency, you must seek medical attention immediately by calling 911 or calling your MD.  Dennis Bast must read the complete instructions/literature along with all the possible adverse reactions/side effects for all the medicines you take including new medications that have been prescribed to you. Take new medicines after you have completely understood and accpet all the possible adverse reactions/side effects.   Do not drive when taking pain medications or sedatives.    Do not take more than prescribed Pain, Sleep and Anxiety Medications  If you have smoked or chewed Tobacco in the last 2 yrs please stop. Stop any regular alcohol and or recreational drug use.  Wear Seat belts while driving.   Information on my medicine - Coumadin   (Warfarin)  This medication education was reviewed with me or my healthcare representative as part of my discharge preparation.    Why was Coumadin prescribed for you? Coumadin was prescribed for you because you have a blood clot or a medical condition that can cause an increased risk of forming blood clots. Blood clots can cause serious health problems by blocking the flow of blood to the heart, lung, or brain. Coumadin can prevent harmful blood clots from forming. As a reminder your indication for Coumadin is:   Stroke Prevention Because Of Atrial Fibrillation  What test will check on my response to Coumadin? While on Coumadin (warfarin) you will need to have an INR test regularly to ensure that your dose is keeping you in the desired range. The INR (international  normalized ratio) number is calculated from the result of the laboratory test called prothrombin time (PT).  If an INR APPOINTMENT HAS NOT ALREADY BEEN MADE FOR YOU please schedule an appointment to have this lab work done by your health care provider within 7 days. Your INR goal is usually a number between:  2 to 3 or your provider may give you a more narrow range like 2-2.5.  Ask your health care provider during an office visit what your goal INR is.  What  do you need to  know  About  COUMADIN? Take Coumadin (warfarin) exactly as prescribed by your healthcare provider about the same time each day.  DO NOT stop taking without talking to the doctor who prescribed the medication.  Stopping without other blood clot prevention medication to take the place of Coumadin may increase your risk of developing a new clot or stroke.  Get refills before you run out.  What do you do if you miss a dose? If you miss a dose, take it as soon as you remember on the same day then continue your regularly scheduled regimen the next day.  Do not take two doses of Coumadin at the same time.  Important Safety Information A possible side effect of Coumadin (Warfarin) is an increased risk of bleeding. You should call your healthcare provider right away if you experience any of the following: ? Bleeding from an injury or your nose that does not stop. ? Unusual colored urine (red or dark brown) or unusual colored stools (red or black). ? Unusual bruising for unknown reasons. ?  A serious fall or if you hit your head (even if there is no bleeding).  Some foods or medicines interact with Coumadin (warfarin) and might alter your response to warfarin. To help avoid this: ? Eat a balanced diet, maintaining a consistent amount of Vitamin K. ? Notify your provider about major diet changes you plan to make. ? Avoid alcohol or limit your intake to 1 drink for women and 2 drinks for men per day. (1 drink is 5 oz. wine, 12 oz.  beer, or 1.5 oz. liquor.)  Make sure that ANY health care provider who prescribes medication for you knows that you are taking Coumadin (warfarin).  Also make sure the healthcare provider who is monitoring your Coumadin knows when you have started a new medication including herbals and non-prescription products.  Coumadin (Warfarin)  Major Drug Interactions  Increased Warfarin Effect Decreased Warfarin Effect  Alcohol (large quantities) Antibiotics (esp. Septra/Bactrim, Flagyl, Cipro) Amiodarone (Cordarone) Aspirin (ASA) Cimetidine (Tagamet) Megestrol (Megace) NSAIDs (ibuprofen, naproxen, etc.) Piroxicam (Feldene) Propafenone (Rythmol SR) Propranolol (Inderal) Isoniazid (INH) Posaconazole (Noxafil) Barbiturates (Phenobarbital) Carbamazepine (Tegretol) Chlordiazepoxide (Librium) Cholestyramine (Questran) Griseofulvin Oral Contraceptives Rifampin Sucralfate (Carafate) Vitamin K   Coumadin (Warfarin) Major Herbal Interactions  Increased Warfarin Effect Decreased Warfarin Effect  Garlic Ginseng Ginkgo biloba Coenzyme Q10 Green tea St. Johns wort    Coumadin (Warfarin) FOOD Interactions  Eat a consistent number of servings per week of foods HIGH in Vitamin K (1 serving =  cup)  Collards (cooked, or boiled & drained) Kale (cooked, or boiled & drained) Mustard greens (cooked, or boiled & drained) Parsley *serving size only =  cup Spinach (cooked, or boiled & drained) Swiss chard (cooked, or boiled & drained) Turnip greens (cooked, or boiled & drained)  Eat a consistent number of servings per week of foods MEDIUM-HIGH in Vitamin K (1 serving = 1 cup)  Asparagus (cooked, or boiled & drained) Broccoli (cooked, boiled & drained, or raw & chopped) Brussel sprouts (cooked, or boiled & drained) *serving size only =  cup Lettuce, raw (green leaf, endive, romaine) Spinach, raw Turnip greens, raw & chopped   These websites have more information on Coumadin (warfarin):   FailFactory.se; VeganReport.com.au;

## 2015-12-19 NOTE — NC FL2 (Signed)
MEDICAID FL2 LEVEL OF CARE SCREENING TOOL     IDENTIFICATION  Patient Name: [name] Birthdate: 1923/09/26 Sex: male Admission Date (Current Location): 12/17/2015  [facility] and [place] Number:  Herbalist and Address:  [facility] [place], [place] [address], [place], [place] [address]      Provider Number: [id number]  Attending Physician Name and Address:  Debbe Odea, MD  Relative Name and Phone Number:  Ernst Spell - daughter.  [phone] (mobile)    Current Level of Care: Hospital Recommended Level of Care: [place] Prior Approval Number:    Date Approved/Denied:   PASRR Number: [id number] (Eff. 12/19/15)  Discharge Plan: SNF    Current Diagnoses: Patient Active Problem List   Diagnosis Date Noted  . Polyarticular gout 12/18/2015  . Aneurysm ([place]) 07/11/2015  . 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 Encounter for therapeutic drug monitoring 10/30/2014  . Mitral valve disorder 08/08/2013  . Carotid artery disease ([place]) 06/06/2013  . Atrial fibrillation ([place]) 02/24/2012  . HTN (hypertension) 02/24/2012  . Kidney disease, chronic, stage III (GFR 30-59 ml/min) 02/24/2012  . Deficiency anemia 02/24/2012    Orientation RESPIRATION BLADDER Height & Weight     Self, Situation, Place  Normal Continent Weight: 161 lb 13.1 oz (73.4 kg) Height:  5\' 11"  (180.3 cm)  BEHAVIORAL SYMPTOMS/MOOD NEUROLOGICAL BOWEL NUTRITION STATUS      Continent Diet (Low sodium - Heart healthy)  AMBULATORY STATUS COMMUNICATION OF NEEDS Skin   Extensive Assist (Patient was unable to ambulate with PT on 10/4) Verbally Normal                       Personal Care Assistance Level of Assistance  Bathing, Feeding, Dressing Bathing Assistance: Limited assistance Feeding assistance: Independent Dressing Assistance: Limited assistance     Functional Limitations Info  Sight, Hearing, Speech Sight Info:  Adequate Hearing Info: Impaired Speech Info: Adequate    SPECIAL CARE FACTORS FREQUENCY  PT (By licensed PT)     PT Frequency: Evaluated 10/4 and a minimum of 3X per week therapy recommended              Contractures Contractures Info: Not present    Additional Factors Info  Code Status, Allergies Code Status Info: Full Allergies Info: No known allergies           Current Medications (12/19/2015):  This is the current hospital active medication list Current Facility-Administered Medications  Medication Dose Route Frequency Provider Last Rate Last Dose  . acetaminophen (TYLENOL) tablet 650 mg  650 mg Oral Q6H PRN Rise Patience, MD       Or  . acetaminophen (TYLENOL) suppository 650 mg  650 mg Rectal Q6H PRN Rise Patience, MD      . amLODipine (NORVASC) tablet 5 mg  5 mg Oral Daily Rise Patience, MD   5 mg at 12/19/15 1057  . atenolol (TENORMIN) tablet 25 mg  25 mg Oral Daily Rise Patience, MD   25 mg at 12/19/15 1057  . colchicine tablet 0.6 mg  0.6 mg Oral Daily Rise Patience, MD   0.6 mg at 12/19/15 1056  . diclofenac sodium (VOLTAREN) 1 % transdermal gel 2 g  2 g Topical QID Rise Patience, MD   2 g at 12/18/15 1800  . digoxin (LANOXIN) tablet 0.125 mg  0.125 mg Oral Daily Rise Patience, MD   0.125  mg at 12/19/15 1056  . hydrALAZINE (APRESOLINE) injection 10 mg  10 mg Intravenous Q4H PRN Rise Patience, MD      . lisinopril (PRINIVIL,ZESTRIL) tablet 10 mg  10 mg Oral Daily Rise Patience, MD   10 mg at 12/19/15 1057  . omega-3 acid ethyl esters (LOVAZA) capsule 1 g  1 g Oral Daily Rise Patience, MD   1 g at 12/19/15 1059  . ondansetron (ZOFRAN) tablet 4 mg  4 mg Oral Q6H PRN Rise Patience, MD       Or  . ondansetron [facility] Iv) injection 4 mg  4 mg Intravenous Q6H PRN Rise Patience, MD      . Derrill Memo ON 12/20/2015] predniSONE (DELTASONE) tablet 40 mg  40 mg Oral Q breakfast Debbe Odea, MD      . tamsulosin  (FLOMAX) capsule 0.4 mg  0.4 mg Oral Daily Rise Patience, MD   0.4 mg at 12/19/15 1059  . warfarin (COUMADIN) tablet 2.5 mg  2.5 mg Oral ONCE-1800 Debbe Odea, MD      . Warfarin - Pharmacist Dosing Inpatient   Does not apply [id number] Rise Patience, MD   1 each at 12/18/15 1800     Discharge Medications: Please see discharge summary for a list of discharge medications.  Relevant Imaging Results:  Relevant Lab Results:   Additional Information ss#[id number].  Sable Feil, LCSW

## 2015-12-19 NOTE — Progress Notes (Addendum)
ANTICOAGULATION CONSULT NOTE - Initial Consult  Pharmacy Consult for Coumadin Indication: atrial fibrillation  No Known Allergies  Patient Measurements: Height: 5\' 11"  (180.3 cm) Weight: 162 lb 0.6 oz (73.5 kg) IBW/kg (Calculated) : 75.3  Vital Signs: Temp: 98.2 F (36.8 C) (10/05 0544) Temp Source: Oral (10/05 0544) BP: 145/62 (10/05 0544) Pulse Rate: 86 (10/05 0544)  Labs:  Recent Labs  12/17/15 1835 12/18/15 [id number] 12/19/15 0437  HGB 10.7* 9.9* 9.5*  HCT 33.9* 30.7* 29.7*  PLT 161 155 165  LABPROT 20.5* 21.4* 27.0*  INR 1.73 1.83 2.45  CREATININE 1.32* 1.45*  --    Estimated Creatinine Clearance: 35.2 mL/min (by C-G formula based on SCr of 1.45 mg/dL (H)).  Medical History: Past Medical History:  Diagnosis Date  . Arthritis    "right wrist and hand" (02/25/2012)  . Atrial fibrillation ([place])   . Carotid artery occlusion   . Diverticula of colon   . Enlarged prostate   . Hypertension   . Knee pain, right    "twisted it couple days ago; pain getting worse since" (02/25/2012)  . Skin cancer of face    Medications:  Prescriptions Prior to Admission  Medication Sig Dispense Refill Last Dose  . allopurinol (ZYLOPRIM) 100 MG tablet Take 1 tablet (100 mg total) by mouth daily. 30 tablet 11 12/17/2015 at Unknown time  . amLODipine (NORVASC) 5 MG tablet Take 5 mg by mouth daily.   12/17/2015 at Unknown time  . atenolol (TENORMIN) 25 MG tablet Take 25 mg by mouth daily.   12/17/2015 at 0800  . diclofenac sodium (VOLTAREN) 1 % GEL Apply 2 g topically 4 (four) times daily. 100 g 11 12/16/2015 at Unknown time  . digoxin (LANOXIN) 0.125 MG tablet Take 1 tablet (0.125 mg total) by mouth daily. 90 tablet 3 12/17/2015 at Unknown time  . MULTIPLE VITAMINS PO Take   1 tablet daily   Past Week at Unknown time  . Omega-3 Fatty Acids (FISH OIL) 1000 MG CAPS Take 1,000 mg by mouth daily.   12/17/2015 at Unknown time  . quinapril (ACCUPRIL) 10 MG tablet Take 10 mg by mouth daily.    12/17/2015 at Unknown time  . Tamsulosin HCl (FLOMAX) 0.4 MG CAPS Take 0.4 mg by mouth daily.   12/17/2015 at Unknown time  . warfarin (COUMADIN) 5 MG tablet Take 2.5-5 mg by mouth daily. Take 2.5mg  on Monday and Fridays, then take 5mg  rest of days   12/17/2015 at 1200  . colchicine 0.6 MG tablet Take 1 tablet (0.6 mg total) by mouth daily. 90 tablet 0 unknown at unknown  . methylPREDNISolone (MEDROL DOSEPAK) 4 MG TBPK tablet TAKE AS DIRECTED (Patient not taking: Reported on 12/17/2015) 21 tablet 0 Not Taking at Unknown time  . warfarin (COUMADIN) 5 MG tablet Take as directed by coumadin clinic (Patient not taking: Reported on 12/17/2015) 135 tablet 0 Not Taking   Assessment: 82 y.o. male admitted with LE swelling, h/o Afib, to continue Coumadin. Patient received a 2.5 mg dose yesterday morning for subtherapeutic INR at time of admission. There was not an even dose administered. Medication compliance prior to admission is questionable and given the INR change from 1.83 to 2.45 with half of his reported dose, pharmacy will continue to monitor closely. HgB 9.5 stable and platelets 165 stable. No reported bleeding.   Clinic notes indicate PTA warfarin dose: 7.5mg  daily except on Mondays and Fridays take 5mg .    Goal of Therapy:  INR 2-3 Monitor platelets by  anticoagulation protocol: Yes   Plan:  Coumadin 2.5mg  x1 Daily INR and CBC  Georga Bora, PharmD Clinical Pharmacist Pager: [phone] 12/19/2015 8:34 AM

## 2015-12-19 NOTE — Clinical Social Work Note (Signed)
CSW talked with patient and family [name]-daughter and [name]) at the bedside regarding SNF placement. Patient is adamantly against going to a facility and only wants to go home. Patient's family talked with him and also had a family member on the phone, however he continued to refuse. CSW talked with MD and provided update and MD also talked with daughter (by phone) and advised her that patient will be discharged Friday, 10/6. CSW advised by Octavia Bruckner that additional family coming to hospital that includes patient's grandson and it hoped they can convince him to go to [place] [facility] before returning home, especially since he lives alone. CSW will follow-up with patient and family on Friday regarding d/c plan.  Wynton Hufstetler Givens, MSW, LCSW Licensed Clinical Social Worker [place] [phone]

## 2015-12-19 NOTE — Discharge Summary (Addendum)
Physician Discharge Summary  ROHITH Burns [id number] DOB: May 06, 1923 DOA: 12/17/2015  PCP: Scarlette Calico, MD  Admit date: 12/17/2015 Discharge date: 12/19/2015  Admitted From: home alone Disposition:  SNF   Recommendations for Outpatient Follow-up:  1. HHRN to ensure he is taking medications appropriately. 2. HHPT   Discharge Condition:  stable   CODE STATUS:  Full code   Diet recommendation:  Heart healthy, low sodium Consultations:  none    Discharge Diagnoses:  Principal Problem:   Polyarticular gout Active Problems:   Atrial fibrillation (HCC)   HTN (hypertension)   Kidney disease, chronic, stage III (GFR 30-59 ml/min)    Subjective: Pain and swelling in joints improving. No new symptoms.   Brief Summary: [name] Atkinsis a 82 y.o.malewith history of gout, persistent atrial fibrillation, hypertension, chronic anemia and chronic kidney disease stage 3was brought to the ER to patient had worsening joint pains involving the both wrist left knee and ankle pain on trying to walk. This has been going on for about 2 days now. Patient states his pillbox was not properly failed and he missed his gout medications including allopurinol and colchicine for last 9 days.  Hospital Course:  Principal Problem: Acute polyarticular gout - right wrist, left knees and ankle - has improved with V Steroids - have transitioned to a short course of prednisone  - he understands that he will need to be more vigilant about taking his medications appropriately  - resume Allopurinol and Colchicine in 3 days  Active Problems:   Atrial fibrillation - on Digoxin, Atenolol and Coumadin- INR subtherapeutic yesterday but now 2.45- pharmacy is dosing    HTN (hypertension) - Norvasc, Atenolol, Lisinopril    Kidney disease, chronic, stage III (GFR 30-59 ml/min) - stable  BPH - Flomax  Discharge Instructions  Discharge Instructions    Diet - low sodium heart healthy    Complete by:   As directed    Increase activity slowly    Complete by:  As directed        Medication List    STOP taking these medications   allopurinol 100 MG tablet Commonly known as:  ZYLOPRIM   colchicine 0.6 MG tablet   methylPREDNISolone 4 MG Tbpk tablet Commonly known as:  MEDROL DOSEPAK     TAKE these medications   amLODipine 5 MG tablet Commonly known as:  NORVASC Take 5 mg by mouth daily.   atenolol 25 MG tablet Commonly known as:  TENORMIN Take 25 mg by mouth daily.   diclofenac sodium 1 % Gel Commonly known as:  VOLTAREN Apply 2 g topically 4 (four) times daily.   digoxin 0.125 MG tablet Commonly known as:  LANOXIN Take 1 tablet (0.125 mg total) by mouth daily.   Fish Oil 1000 MG Caps Take 1,000 mg by mouth daily.   MULTIPLE VITAMINS PO Take   1 tablet daily   predniSONE 20 MG tablet Commonly known as:  DELTASONE Take 2 tablets (40 mg total) by mouth daily with breakfast. Start taking on:  12/20/2015   quinapril 10 MG tablet Commonly known as:  ACCUPRIL Take 10 mg by mouth daily.   tamsulosin 0.4 MG Caps capsule Commonly known as:  FLOMAX Take 0.4 mg by mouth daily.   warfarin 5 MG tablet Commonly known as:  COUMADIN Take 2.5-5 mg by mouth daily. Take 2.5mg  on Monday and Fridays, then take 5mg  rest of days What changed:  Another medication with the same name was removed. Continue taking this  medication, and follow the directions you see here.       No Known Allergies   Procedures/Studies:  No results found.     Discharge Exam: Vitals:   12/19/15 1056 12/19/15 1220  BP: (!) 153/69 135/70  Pulse: 89 66  Resp:    Temp:     Vitals:   12/19/15 0544 12/19/15 0902 12/19/15 1056 12/19/15 1220  BP: (!) 145/62 (!) 154/72 (!) 153/69 135/70  Pulse: 86 81 89 66  Resp: 19 18    Temp: 98.2 F (36.8 C) 97.9 F (36.6 C)    TempSrc: Oral Oral    SpO2: 96% 97%    Weight:      Height:        General: Pt is alert, awake, not in acute  distress Cardiovascular: RRR, S1/S2 +, no rubs, no gallops Respiratory: CTA bilaterally, no wheezing, no rhonchi Abdominal: Soft, NT, ND, bowel sounds + Extremities: no edema, no cyanosis    The results of significant diagnostics from this hospitalization (including imaging, microbiology, ancillary and laboratory) are listed below for reference.     Microbiology: No results found for this or any previous visit (from the past 240 hour(s)).   Labs: BNP (last 3 results) No results for input(s): BNP in the last 8760 hours. Basic Metabolic Panel:  Recent Labs Lab 12/17/15 1835 12/18/15 0638  NA 140 137  K 4.2 4.4  CL 112* 110  CO2 25 22  GLUCOSE 117* 165*  BUN 27* 36*  CREATININE 1.32* 1.45*  CALCIUM 9.9 9.6   Liver Function Tests:  Recent Labs Lab 12/18/15 0638  AST 20  ALT 11*  ALKPHOS 41  BILITOT 1.1  PROT 6.7  ALBUMIN 2.8*   No results for input(s): LIPASE, AMYLASE in the last 168 hours. No results for input(s): AMMONIA in the last 168 hours. CBC:  Recent Labs Lab 12/17/15 1835 12/18/15 0638 12/19/15 0437  WBC 10.9* 9.4 15.7*  NEUTROABS 9.3* 8.7*  --   HGB 10.7* 9.9* 9.5*  HCT 33.9* 30.7* 29.7*  MCV 87.1 87.2 85.1  PLT 161 155 165   Cardiac Enzymes: No results for input(s): CKTOTAL, CKMB, CKMBINDEX, TROPONINI in the last 168 hours. BNP: Invalid input(s): POCBNP CBG: No results for input(s): GLUCAP in the last 168 hours. D-Dimer No results for input(s): DDIMER in the last 72 hours. Hgb A1c No results for input(s): HGBA1C in the last 72 hours. Lipid Profile No results for input(s): CHOL, HDL, LDLCALC, TRIG, CHOLHDL, LDLDIRECT in the last 72 hours. Thyroid function studies No results for input(s): TSH, T4TOTAL, T3FREE, THYROIDAB in the last 72 hours.  Invalid input(s): FREET3 Anemia work up No results for input(s): VITAMINB12, FOLATE, FERRITIN, TIBC, IRON, RETICCTPCT in the last 72 hours. Urinalysis    Component Value Date/Time   COLORURINE  YELLOW 07/08/2015 1140   APPEARANCEUR CLOUDY (A) 07/08/2015 1140   LABSPEC 1.019 07/08/2015 1140   PHURINE 6.5 07/08/2015 1140   GLUCOSEU NEGATIVE 07/08/2015 1140   HGBUR MODERATE (A) 07/08/2015 1140   BILIRUBINUR NEGATIVE 07/08/2015 1140   KETONESUR NEGATIVE 07/08/2015 1140   PROTEINUR 100 (A) 07/08/2015 1140   UROBILINOGEN 1.0 08/06/2012 1637   NITRITE NEGATIVE 07/08/2015 1140   LEUKOCYTESUR LARGE (A) 07/08/2015 1140   Sepsis Labs Invalid input(s): PROCALCITONIN,  WBC,  LACTICIDVEN Microbiology No results found for this or any previous visit (from the past 240 hour(s)).   Time coordinating discharge: Over 30 minutes  SIGNED:   Debbe Odea, MD  [facility] 12/19/2015, 2:01 PM  Pager   If 7PM-7AM, please contact night-coverage [email or web address] Password [email or web address]

## 2015-12-20 LAB — CBC
HCT: 32.4 % — ABNORMAL LOW (ref 39.0–52.0)
HEMOGLOBIN: 10.6 g/dL — AB (ref 13.0–17.0)
MCH: 27.7 pg (ref 26.0–34.0)
MCHC: 32.7 g/dL (ref 30.0–36.0)
MCV: 84.8 fL (ref 78.0–100.0)
PLATELETS: 194 10*3/uL (ref 150–400)
RBC: 3.82 MIL/uL — ABNORMAL LOW (ref 4.22–5.81)
RDW: 16.1 % — AB (ref 11.5–15.5)
WBC: 12 10*3/uL — ABNORMAL HIGH (ref 4.0–10.5)

## 2015-12-20 LAB — PROTIME-INR
INR: 2.77
PROTHROMBIN TIME: 29.8 s — AB (ref 11.4–15.2)

## 2015-12-20 MED ORDER — WARFARIN SODIUM 1 MG PO TABS
1.0000 mg | ORAL_TABLET | Freq: Once | ORAL | Status: AC
  Administered 2015-12-20: 1 mg via ORAL
  Filled 2015-12-20: qty 1

## 2015-12-20 NOTE — Care Management Note (Addendum)
Case Management Note  Patient Details  Name: [name] MRN: [id number] Date of Birth: December 30, 1923  Subjective/Objective:   Acute polyarticular gout                              Action/Plan: Discharge Planning: AVS reviewed: Please previous NCM note.  NCM spoke to pt dtr, [name]. Pt is refusing SNF placement. States he prefers to go home. Offered choice/HH list provided for [facility]. Dtr requested [place].  [facility] contacted for [facility] for scheduled dc home today. Provided pt and dtr with list of private duty agencies.     1225 NCM spoke with pt and dtrs/son-in-law at bedside. Pt agreeable to [facility] PT/OT. Dtrs will check into benefits with [facility] for personal care assistant to come in each day to assist him at home. [place] Liaison came to discuss HHPT/OT services with family. Family plans to look in to safety measures such as medical alert system so pt can remain independent in home.   Dtr states pt had [facility] in the past. Contacted [facility] to see if they are able to provided [facility] RN, PT, OT and aide for home. [facility] unable to provided [facility] RN. Pt needs assistance with CHF disease management and medication compliance. Dtr does assist pt with putting meds in pill planner each week. Pt missed several days taking meds.    1456 [facility] cannot accept referral, out of network. Contacted [facility]. They are in [facility] and can do HHPT/RN with INR needed on 12/22/2015. Made family aware.      PCP Scarlette Calico MD  Expected Discharge Date:  12/20/2015            Expected Discharge Plan:  [place]  In-House Referral:  Clinical Social Work  Discharge planning Services  CM Consult  Post Acute Care Choice:  Home Health Choice offered to:  Adult Children  DME Arranged:  N/A DME Agency:  NA  HH Arranged:  PT, OT, Nurse's Aide, RN [place] Agency:  [place]  Status of Service:  Completed, signed off  If discussed at [place] of Stay Meetings, dates discussed:     Additional Comments:  Erenest Rasher, RN 12/20/2015, 10:08 AM

## 2015-12-20 NOTE — Consult Note (Signed)
[facility] CM Primary Care Navigator  12/20/2015  [name] September 09, 1923 [id number]  Met with patient, daughter [name]) and son-in law Linna Hoff) at the bedside to identify possible discharge needs. Patient is oriented to self and was giving on and off inappropriate responses and remarks when talked to. Per daughter, patient had complained of pain to arms/hands with limited movements that had led to this admission. Patient's daughter endorses Dr. Scarlette Calico with [facility] at [facility] as the primary care provider.    Daughter shared using  [employer] to obtain for majority of his medicines and [facility] at [facility] for the rest of medications not covered by [place] as stated. Patient's daughter manages medications for patient using "pill box" system.     Transportation to doctors' appointments is being provided by daughter and she assists with patient's care needs same as with her other sister from [place] as stated although unable to provide 24 hour assistance or supervision after his discharge. According to daughter, patient was living alone and independent with self care prior to admission but had not been taking his medications for more than a week. Inpatient CM mentioned that she provided patient and daughter with list of private duty agencies since patient had refused to go to SNF and he will discharge with home health services. Patient was not agreeable to any referral for disease and medication management. Patient's daughter voiced understanding to call primary care provider's office for a post discharge follow-up appointment within a week or sooner if needs arise. Patient letter provided as a reminder.  For additional questions please contact:  Edwena Felty A. Eliyahu Bille, BSN, RN-BC [facility] PRIMARY CARE Navigator Cell: [phone]

## 2015-12-20 NOTE — Progress Notes (Signed)
[facility]  Patient examined, chart reviewed. He has declined to go to SNF and will go home. Daughter is aware. He is stable for discharge today. Please see my d/c summary from 10/5.   Debbe Odea, MD

## 2015-12-20 NOTE — Progress Notes (Addendum)
ANTICOAGULATION CONSULT NOTE - Initial Consult  Pharmacy Consult for Coumadin Indication: atrial fibrillation  No Known Allergies  Patient Measurements: Height: 5\' 11"  (180.3 cm) Weight: 161 lb 13.1 oz (73.4 kg) IBW/kg (Calculated) : 75.3  Vital Signs: Temp: 98.4 F (36.9 C) (10/06 0840) Temp Source: Oral (10/06 0840) BP: 164/115 (10/06 0840) Pulse Rate: 85 (10/06 0840)  Labs:  Recent Labs  12/17/15 1835 12/18/15 [id number] 12/19/15 0437 12/20/15 0455  HGB 10.7* 9.9* 9.5* 10.6*  HCT 33.9* 30.7* 29.7* 32.4*  PLT 161 155 165 194  LABPROT 20.5* 21.4* 27.0* 29.8*  INR 1.73 1.83 2.45 2.77  CREATININE 1.32* 1.45*  --   --    Estimated Creatinine Clearance: 35.2 mL/min (by C-G formula based on SCr of 1.45 mg/dL (H)).  Medical History: Past Medical History:  Diagnosis Date  . Arthritis    "right wrist and hand" (02/25/2012)  . Atrial fibrillation ([place])   . Carotid artery occlusion   . Diverticula of colon   . Enlarged prostate   . Hypertension   . Knee pain, right    "twisted it couple days ago; pain getting worse since" (02/25/2012)  . Skin cancer of face    Medications:  Prescriptions Prior to Admission  Medication Sig Dispense Refill Last Dose  . allopurinol (ZYLOPRIM) 100 MG tablet Take 1 tablet (100 mg total) by mouth daily. 30 tablet 11 12/17/2015 at Unknown time  . amLODipine (NORVASC) 5 MG tablet Take 5 mg by mouth daily.   12/17/2015 at Unknown time  . atenolol (TENORMIN) 25 MG tablet Take 25 mg by mouth daily.   12/17/2015 at 0800  . diclofenac sodium (VOLTAREN) 1 % GEL Apply 2 g topically 4 (four) times daily. 100 g 11 12/16/2015 at Unknown time  . digoxin (LANOXIN) 0.125 MG tablet Take 1 tablet (0.125 mg total) by mouth daily. 90 tablet 3 12/17/2015 at Unknown time  . MULTIPLE VITAMINS PO Take   1 tablet daily   Past Week at Unknown time  . Omega-3 Fatty Acids (FISH OIL) 1000 MG CAPS Take 1,000 mg by mouth daily.   12/17/2015 at Unknown time  . quinapril (ACCUPRIL)  10 MG tablet Take 10 mg by mouth daily.   12/17/2015 at Unknown time  . Tamsulosin HCl (FLOMAX) 0.4 MG CAPS Take 0.4 mg by mouth daily.   12/17/2015 at Unknown time  . warfarin (COUMADIN) 5 MG tablet Take 2.5-5 mg by mouth daily. Take 2.5mg  on Monday and Fridays, then take 5mg  rest of days   12/17/2015 at 1200  . colchicine 0.6 MG tablet Take 1 tablet (0.6 mg total) by mouth daily. 90 tablet 0 unknown at unknown  . methylPREDNISolone (MEDROL DOSEPAK) 4 MG TBPK tablet TAKE AS DIRECTED (Patient not taking: Reported on 12/17/2015) 21 tablet 0 Not Taking at Unknown time  . warfarin (COUMADIN) 5 MG tablet Take as directed by coumadin clinic (Patient not taking: Reported on 12/17/2015) 135 tablet 0 Not Taking   Assessment: 82 y.o. male admitted with LE swelling, h/o Afib, to continue Coumadin. Patient received one dose of warfarin 2.5 mg on 10/4  for subtherapeutic INR 1.83 at time of admission and INR has climbed to 2.77. Patient refused dose yesterday (10/5) when INR was 2.45. Medication compliance prior to admission is questionable and given the INR increase without warfarin administration yesterday, pharmacy will continue to monitor closely. HgB 10.6 stable and platelets 194 stable. No reported bleeding.   Clinic notes indicate PTA warfarin dose: 7.5mg  daily except on Mondays  and Fridays take 5mg .    Goal of Therapy:  INR 2-3 Monitor platelets by anticoagulation protocol: Yes   Plan:  Coumadin 1mg  x1 Daily INR and CBC  Georga Bora, PharmD Clinical Pharmacist Pager: [phone] 12/20/2015 12:34 PM

## 2015-12-20 NOTE — Progress Notes (Signed)
Physical Therapy Treatment Patient Details Name: [name] MRN: [id number] DOB: 08-Nov-1923 Today's Date: 12/20/2015    History of Present Illness [name] is a 82 y.o. male with history of gout, persistent atrial fibrillation, hypertension, chronic anemia and chronic kidney disease was brought to the ER to patient had worsening joint pains involving the both wrist left knee and ankle pain on trying to walk. Patient states his pillbox was not properly failed and he missed his gout medications including allopurinol and colchicine for last 9 days. Patient started having severe pain last 2 days making difficult to walk    PT Comments    Patient with improved mobility today as gout pain decreased.  Able to ambulate 200' with RW and min guard assist.  Remains somewhat unsteady.  Continue to recommend SNF, however patient refusing.  Recommend HHPT f/u at d/c for continued therapy.   Follow Up Recommendations  Home health PT;Supervision - Intermittent (While SNF would be recommended, patient refusing)     Equipment Recommendations  None recommended by PT (Patient has all equipment needed)    Recommendations for Other Services       Precautions / Restrictions Precautions Precautions: Fall Restrictions Weight Bearing Restrictions: No    Mobility  Bed Mobility               General bed mobility comments: Patient EOB as PT entered room  Transfers Overall transfer level: Needs assistance Equipment used: Rolling walker (2 wheeled) Transfers: Sit to/from Stand Sit to Stand: Min guard         General transfer comment: Verbal cues for hand placement - patient attempting to pull on RW to stand.  Assist for safety/balance.  Ambulation/Gait Ambulation/Gait assistance: Min guard Ambulation Distance (Feet): 200 Feet Assistive device: Rolling walker (2 wheeled) Gait Pattern/deviations: Step-through pattern;Decreased stride length;Shuffle;Trunk flexed   Gait velocity  interpretation: at or above normal speed for age/gender General Gait Details: Verbal cues for safe use of RW.  Patient keeping RW too far ahead of himself with flexed posture.  Cues to keep feet inside RW.  Patient unsteady at times even with RW.  No loss of balance during gait.   Stairs            Wheelchair Mobility    Modified Rankin (Stroke Patients Only)       Balance           Standing balance support: Bilateral upper extremity supported Standing balance-Leahy Scale: Poor Standing balance comment: UE support required for balance                    Cognition Arousal/Alertness: Awake/alert Behavior During Therapy: Restless (Irritated) Overall Cognitive Status: Within Functional Limits for tasks assessed (Decreased safety awareness)                      Exercises      General Comments        Pertinent Vitals/Pain Pain Assessment: No/denies pain    Home Living                      Prior Function            PT Goals (current goals can now be found in the care plan section) Progress towards PT goals: Progressing toward goals    Frequency    Min 3X/week      PT Plan Discharge plan needs to be updated    Co-evaluation  End of Session Equipment Utilized During Treatment: Gait belt Activity Tolerance: Patient tolerated treatment well Patient left: in chair;with call bell/phone within reach     Time: [id number] PT Time Calculation (min) (ACUTE ONLY): 17 min  Charges:  $Gait Training: 8-22 mins                    G Codes:      [name] January 16, 2016, 11:35 AM [name]. Sanjuana Kava, [place] Pager [phone]

## 2015-12-20 NOTE — Progress Notes (Signed)
Patient Discharge: Disposition: Patient discharged to home. Education: Reviewed medications, prescriptions, discharge instructions, and follow-up appointments with patient and family members, understood and acknowledged. IV: Discontinued IV before discharge. Telemetry: N/A Transportation: Patient escorted out of the unit accompanied by the family members. Belongings: Patient took all his belongings with him.

## 2015-12-20 NOTE — Care Management Important Message (Signed)
Important Message  Patient Details  Name: [name] MRN: [id number] Date of Birth: May 30, 1923   Medicare Important Message Given:  Yes    [name] 12/20/2015, 12:04 PM

## 2015-12-24 ENCOUNTER — Telehealth: Payer: Self-pay | Admitting: Internal Medicine

## 2015-12-24 NOTE — Telephone Encounter (Signed)
Patient has declined home health aid.  Do have order for PTINR.  Need to know if ok to use meter.  Please call back to get nurse out tomorrow to check.

## 2015-12-24 NOTE — Telephone Encounter (Signed)
Verbal okay given - HH will call results of INR

## 2015-12-25 ENCOUNTER — Telehealth: Payer: Self-pay | Admitting: Internal Medicine

## 2015-12-26 ENCOUNTER — Ambulatory Visit: Payer: Self-pay | Admitting: General Practice

## 2015-12-26 ENCOUNTER — Telehealth: Payer: Self-pay | Admitting: Internal Medicine

## 2015-12-26 DIAGNOSIS — Z5181 Encounter for therapeutic drug level monitoring: Secondary | ICD-10-CM

## 2015-12-26 LAB — POCT INR: INR: 2.6

## 2015-12-26 NOTE — Telephone Encounter (Signed)
Mark With [facility]   Need Verbal for PT  2 week 4   [phone]

## 2015-12-26 NOTE — Telephone Encounter (Signed)
PT INR results 22.4 and 2.6.

## 2015-12-26 NOTE — Progress Notes (Signed)
I have reviewed and agree with the plan. 

## 2015-12-26 NOTE — Telephone Encounter (Signed)
Verbal okay left on machine.

## 2015-12-27 ENCOUNTER — Ambulatory Visit: Payer: Medicare HMO

## 2015-12-27 ENCOUNTER — Telehealth: Payer: Self-pay | Admitting: Internal Medicine

## 2015-12-27 NOTE — Telephone Encounter (Signed)
[facility]  Patient Name: [name]  DOB: 1923-08-10    Initial Comment Caller states father is having pain in his leg from gout.    Nurse Assessment  Nurse: Harlow Mares, RN, Suanne Marker Date/Time Eilene Ghazi Time): 12/27/2015 6:17:30 PM  Confirm and document reason for call. If symptomatic, describe symptoms. You must click the next button to save text entered. ---Caller states father is having pain in his leg from gout. Should he start back on his prednisone. Left ankle is swollen. Increased pain today, can hardly walk. Denies fever.  Has the patient traveled out of the country within the last 30 days? ---Not Applicable  Does the patient have any new or worsening symptoms? ---Yes  Will a triage be completed? ---Yes  Related visit to physician within the last 2 weeks? ---Yes  Does the PT have any chronic conditions? (i.e. diabetes, asthma, etc.) ---Yes  List chronic conditions. ---gout; HTN,  Is this a behavioral health or substance abuse call? ---No     Guidelines    Guideline Title Affirmed Question Affirmed Notes  Ankle Swelling [1] Thigh, calf, or ankle swelling AND [2] only 1 side    Final Disposition User   See Physician within 4 Hours (or PCP triage) Harlow Mares, RN, Rhonda    Comments  Caller states father is having pain in his leg from gout. D/c last Friday from hospital. Was taking prednisone until Monday. Was told to take his gout meds on Tuesday, caller reports that his gout is worse since this change. Caller not with patient, asked if nurse would call her sister, who is going to see the patient in about 30 mins.  Advised caller to check with MD office on Monday for any possible cancellations. Caller has appt on Thurs with Dr. Ronnald Ramp. Refusing [facility] tonight.   Referrals  GO TO FACILITY REFUSED   Disagree/Comply: Disagree  Disagree/Comply Reason: Wait and see

## 2016-01-01 ENCOUNTER — Other Ambulatory Visit (INDEPENDENT_AMBULATORY_CARE_PROVIDER_SITE_OTHER): Payer: Medicare HMO

## 2016-01-01 ENCOUNTER — Ambulatory Visit (INDEPENDENT_AMBULATORY_CARE_PROVIDER_SITE_OTHER): Payer: Medicare HMO | Admitting: Internal Medicine

## 2016-01-01 ENCOUNTER — Encounter: Payer: Self-pay | Admitting: Internal Medicine

## 2016-01-01 VITALS — BP 120/60 | HR 63 | Temp 97.9°F | Resp 16 | Ht 71.0 in | Wt 175.8 lb

## 2016-01-01 DIAGNOSIS — D539 Nutritional anemia, unspecified: Secondary | ICD-10-CM | POA: Diagnosis not present

## 2016-01-01 DIAGNOSIS — E118 Type 2 diabetes mellitus with unspecified complications: Secondary | ICD-10-CM | POA: Diagnosis not present

## 2016-01-01 DIAGNOSIS — N183 Chronic kidney disease, stage 3 unspecified: Secondary | ICD-10-CM

## 2016-01-01 DIAGNOSIS — I779 Disorder of arteries and arterioles, unspecified: Secondary | ICD-10-CM

## 2016-01-01 DIAGNOSIS — M1000 Idiopathic gout, unspecified site: Secondary | ICD-10-CM | POA: Diagnosis not present

## 2016-01-01 DIAGNOSIS — Z23 Encounter for immunization: Secondary | ICD-10-CM

## 2016-01-01 DIAGNOSIS — I739 Peripheral vascular disease, unspecified: Secondary | ICD-10-CM

## 2016-01-01 DIAGNOSIS — M109 Gout, unspecified: Secondary | ICD-10-CM

## 2016-01-01 LAB — CBC WITH DIFFERENTIAL/PLATELET
BASOS ABS: 0 10*3/uL (ref 0.0–0.1)
Basophils Relative: 0.3 % (ref 0.0–3.0)
EOS ABS: 0.1 10*3/uL (ref 0.0–0.7)
Eosinophils Relative: 1.8 % (ref 0.0–5.0)
HCT: 28.2 % — ABNORMAL LOW (ref 39.0–52.0)
HEMOGLOBIN: 9.4 g/dL — AB (ref 13.0–17.0)
LYMPHS ABS: 1.4 10*3/uL (ref 0.7–4.0)
Lymphocytes Relative: 22.8 % (ref 12.0–46.0)
MCHC: 33.2 g/dL (ref 30.0–36.0)
MCV: 84.9 fl (ref 78.0–100.0)
MONO ABS: 0.7 10*3/uL (ref 0.1–1.0)
Monocytes Relative: 10.6 % (ref 3.0–12.0)
NEUTROS PCT: 64.5 % (ref 43.0–77.0)
Neutro Abs: 4 10*3/uL (ref 1.4–7.7)
Platelets: 180 10*3/uL (ref 150.0–400.0)
RBC: 3.33 Mil/uL — AB (ref 4.22–5.81)
RDW: 17.7 % — ABNORMAL HIGH (ref 11.5–15.5)
WBC: 6.2 10*3/uL (ref 4.0–10.5)

## 2016-01-01 LAB — BASIC METABOLIC PANEL
BUN: 29 mg/dL — ABNORMAL HIGH (ref 6–23)
CALCIUM: 9.2 mg/dL (ref 8.4–10.5)
CHLORIDE: 109 meq/L (ref 96–112)
CO2: 25 meq/L (ref 19–32)
Creatinine, Ser: 1.68 mg/dL — ABNORMAL HIGH (ref 0.40–1.50)
GFR: 40.93 mL/min — ABNORMAL LOW (ref >60.00)
GLUCOSE: 91 mg/dL (ref 70–99)
POTASSIUM: 4.5 meq/L (ref 3.5–5.1)
SODIUM: 141 meq/L (ref 135–145)

## 2016-01-01 LAB — VITAMIN B12: Vitamin B-12: 345 pg/mL (ref 211–911)

## 2016-01-01 LAB — URINALYSIS, ROUTINE W REFLEX MICROSCOPIC
BILIRUBIN URINE: NEGATIVE
KETONES UR: NEGATIVE
Leukocytes, UA: NEGATIVE
NITRITE: NEGATIVE
SPECIFIC GRAVITY, URINE: 1.025 (ref 1.000–1.030)
Total Protein, Urine: NEGATIVE
URINE GLUCOSE: NEGATIVE
UROBILINOGEN UA: 0.2 (ref 0.0–1.0)
pH: 5.5 (ref 5.0–8.0)

## 2016-01-01 LAB — FERRITIN: Ferritin: 178 ng/mL (ref 22.0–322.0)

## 2016-01-01 LAB — LIPID PANEL
Cholesterol: 166 mg/dL (ref 0–200)
HDL: 34.4 mg/dL — ABNORMAL LOW (ref >39.00)
LDL CALC: 98 mg/dL (ref 0–99)
NONHDL: 131.11
Total CHOL/HDL Ratio: 5
Triglycerides: 168 mg/dL — ABNORMAL HIGH (ref 0.0–149.0)
VLDL: 33.6 mg/dL (ref 0.0–40.0)

## 2016-01-01 LAB — IBC PANEL
IRON: 51 ug/dL (ref 42–165)
Saturation Ratios: 18.4 % — ABNORMAL LOW (ref 20.0–50.0)
TRANSFERRIN: 198 mg/dL — AB (ref 212.0–360.0)

## 2016-01-01 LAB — TSH: TSH: 5.83 u[IU]/mL — AB (ref 0.35–4.50)

## 2016-01-01 LAB — FOLATE: FOLATE: 9.4 ng/mL (ref >5.9)

## 2016-01-01 LAB — HEMOGLOBIN A1C: HEMOGLOBIN A1C: 6.3 % (ref 4.6–6.5)

## 2016-01-01 NOTE — Progress Notes (Signed)
Subjective:  Patient ID: [name], male    DOB: August 14, 1923  Age: 82 y.o. MRN: [id number]  CC: Anemia   HPI [name] presents for follow-up after a recent admission for severe flare of gout with polyarthritis. He tells me his joint pain has improved. He has mild persistent swelling and discomfort in his left ankle but overall things are improving. He was also found to be anemic. He denies any sources of blood loss and denies numbness or tingling in his extremities. He has mild edema in his lower extremities but he also says that's improving.  Outpatient Medications Prior to Visit  Medication Sig Dispense Refill  . amLODipine (NORVASC) 5 MG tablet Take 5 mg by mouth daily.    Marland Kitchen atenolol (TENORMIN) 25 MG tablet Take 25 mg by mouth daily.    . diclofenac sodium (VOLTAREN) 1 % GEL Apply 2 g topically 4 (four) times daily. 100 g 11  . digoxin (LANOXIN) 0.125 MG tablet Take 1 tablet (0.125 mg total) by mouth daily. 90 tablet 3  . Omega-3 Fatty Acids (FISH OIL) 1000 MG CAPS Take 1,000 mg by mouth daily.    . quinapril (ACCUPRIL) 10 MG tablet Take 10 mg by mouth daily.    . Tamsulosin HCl (FLOMAX) 0.4 MG CAPS Take 0.4 mg by mouth daily.    Marland Kitchen warfarin (COUMADIN) 5 MG tablet Take 2.5-5 mg by mouth daily. Take 2.5mg  on Monday and Fridays, then take 5mg  rest of days    . MULTIPLE VITAMINS PO Take   1 tablet daily    . predniSONE (DELTASONE) 20 MG tablet Take 2 tablets (40 mg total) by mouth daily with breakfast. 6 tablet 0   No facility-administered medications prior to visit.     ROS Review of Systems  Constitutional: Negative for appetite change, chills, diaphoresis, fatigue and fever.  HENT: Negative.  Negative for sinus pressure and trouble swallowing.   Eyes: Negative.   Respiratory: Negative.  Negative for cough, choking, chest tightness, shortness of breath and stridor.   Cardiovascular: Positive for leg swelling. Negative for chest pain and palpitations.  Gastrointestinal:  Negative.  Negative for abdominal pain, constipation, diarrhea, nausea and vomiting.  Endocrine: Negative.   Genitourinary: Negative.  Negative for difficulty urinating.  Musculoskeletal: Positive for arthralgias. Negative for back pain, joint swelling, myalgias and neck pain.  Skin: Negative.   Allergic/Immunologic: Negative.   Neurological: Negative.  Negative for dizziness, weakness, light-headedness and numbness.  Hematological: Negative for adenopathy. Does not bruise/bleed easily.  Psychiatric/Behavioral: Negative.     Objective:  BP 120/60 (BP Location: Left Arm, Patient Position: Sitting, Cuff Size: Normal)   Pulse 63   Temp 97.9 F (36.6 C) (Oral)   Ht 5\' 11"  (1.803 m)   Wt 175 lb 12 oz (79.7 kg)   SpO2 98%   BMI 24.51 kg/m   BP Readings from Last 3 Encounters:  01/01/16 120/60  12/20/15 (!) 164/115  08/07/15 120/70    Wt Readings from Last 3 Encounters:  01/01/16 175 lb 12 oz (79.7 kg)  12/19/15 161 lb 13.1 oz (73.4 kg)  08/07/15 170 lb (77.1 kg)    Physical Exam  Constitutional: He is oriented to person, place, and time. No distress.  HENT:  Mouth/Throat: Oropharynx is clear and moist. No oropharyngeal exudate.  Eyes: Conjunctivae are normal. Right eye exhibits no discharge. Left eye exhibits no discharge. No scleral icterus.  Neck: Normal range of motion. Neck supple. No JVD present. No tracheal deviation  present. No thyromegaly present.  Cardiovascular: Normal rate, regular rhythm, normal heart sounds and intact distal pulses.  Exam reveals no gallop and no friction rub.   No murmur heard. Pulmonary/Chest: Effort normal and breath sounds normal. No stridor. No respiratory distress. He has no wheezes. He has no rales. He exhibits no tenderness.  Abdominal: Soft. Bowel sounds are normal. He exhibits no distension and no mass. There is no tenderness. There is no rebound and no guarding.  Musculoskeletal: Normal range of motion. He exhibits no edema, tenderness or  deformity.  Trace pitting edema in both lower extremities  Lymphadenopathy:    He has no cervical adenopathy.  Neurological: He is oriented to person, place, and time.  Skin: Skin is warm and dry. No rash noted. He is not diaphoretic. No erythema. No pallor.  Vitals reviewed.   Lab Results  Component Value Date   WBC 6.2 01/01/2016   HGB 9.4 (L) 01/01/2016   HCT 28.2 (L) 01/01/2016   PLT 180.0 01/01/2016   GLUCOSE 91 01/01/2016   CHOL 166 01/01/2016   TRIG 168.0 (H) 01/01/2016   HDL 34.40 (L) 01/01/2016   LDLCALC 98 01/01/2016   ALT 11 (L) 12/18/2015   AST 20 12/18/2015   NA 141 01/01/2016   K 4.5 01/01/2016   CL 109 01/01/2016   CREATININE 1.68 (H) 01/01/2016   BUN 29 (H) 01/01/2016   CO2 25 01/01/2016   TSH 5.83 (H) 01/01/2016   INR 2.6 12/26/2015   HGBA1C 6.3 01/01/2016    No results found.  Assessment & Plan:   [name] was seen today for anemia.  Diagnoses and all orders for this visit:  Bilateral carotid artery disease ([name])- there are no complications related to this and he has a reasonably well controlled LDL -     Lipid panel; Future  Kidney disease, chronic, stage III (GFR 30-59 ml/min)- his renal function is stable. -     Basic metabolic panel; Future -     Urinalysis, Routine w reflex microscopic (not at [name]); Future  Polyarticular gout- improvement noted  Deficiency anemia- his vitamin levels are normal, his anemia has worsened slightly and I think this is most likely related to renal insufficiency and the anemia of chronic disease. -     CBC with Differential/Platelet; Future -     Vitamin B12; Future -     TSH; Future -     IBC panel; Future -     Folate; Future -     Ferritin; Future  Type 2 diabetes mellitus with complication, without long-term current use of insulin ([name])- his blood sugars are adequately well controlled. -     Hemoglobin A1c; Future  Need for prophylactic vaccination and inoculation against influenza -     Flu vaccine HIGH  DOSE PF (Fluzone High dose)   I have discontinued [name]'s MULTIPLE VITAMINS PO and predniSONE. I am also having him maintain his tamsulosin, Fish Oil, amLODipine, atenolol, quinapril, digoxin, diclofenac sodium, warfarin, and colchicine.  Meds ordered this encounter  Medications  . colchicine 0.6 MG tablet    Refill:  0     Follow-up: Return in about 3 months (around 04/02/2016).  Scarlette Calico, MD

## 2016-01-01 NOTE — Progress Notes (Signed)
Pre visit review using our clinic review tool, if applicable. No additional management support is needed unless otherwise documented below in the visit note. 

## 2016-01-01 NOTE — Patient Instructions (Signed)

## 2016-01-09 ENCOUNTER — Telehealth: Payer: Self-pay | Admitting: General Practice

## 2016-01-09 LAB — PROTIME-INR: INR: 2.9 — AB (ref <=1.1)

## 2016-01-09 NOTE — Telephone Encounter (Signed)
Orders given to Alycia Rossetti, RN @ [facility] to check patient's INR on Thursday, 10/26.

## 2016-01-10 ENCOUNTER — Telehealth: Payer: Self-pay | Admitting: Emergency Medicine

## 2016-01-10 NOTE — Telephone Encounter (Signed)
Note written, printed and faxed. Forwarding to PCP for an [place]

## 2016-01-10 NOTE — Telephone Encounter (Addendum)
Pts daughter wants to know if you can send a note over to the [place] and let them know the patient is on colchicine 0.6 MG tablet. The fax number is [phone] Attn Dr Carie Caddy. Thanks.

## 2016-01-15 ENCOUNTER — Ambulatory Visit (INDEPENDENT_AMBULATORY_CARE_PROVIDER_SITE_OTHER): Payer: Medicare HMO | Admitting: General Practice

## 2016-01-15 DIAGNOSIS — Z5181 Encounter for therapeutic drug level monitoring: Secondary | ICD-10-CM

## 2016-01-15 NOTE — Patient Instructions (Signed)
Pre visit review using our clinic review tool, if applicable. No additional management support is needed unless otherwise documented below in the visit note. 

## 2016-01-15 NOTE — Progress Notes (Signed)
I agree with this plan.

## 2016-02-03 DIAGNOSIS — I481 Persistent atrial fibrillation: Secondary | ICD-10-CM | POA: Diagnosis not present

## 2016-02-03 DIAGNOSIS — I129 Hypertensive chronic kidney disease with stage 1 through stage 4 chronic kidney disease, or unspecified chronic kidney disease: Secondary | ICD-10-CM | POA: Diagnosis not present

## 2016-02-03 DIAGNOSIS — M1000 Idiopathic gout, unspecified site: Secondary | ICD-10-CM | POA: Diagnosis not present

## 2016-02-03 DIAGNOSIS — N183 Chronic kidney disease, stage 3 (moderate): Secondary | ICD-10-CM | POA: Diagnosis not present

## 2016-02-20 ENCOUNTER — Telehealth: Payer: Self-pay | Admitting: Emergency Medicine

## 2016-02-20 NOTE — Telephone Encounter (Signed)
Pts daughter called and wants to know if you can renew patient home health nurse. He needs helping taking his INR and maybe some PT. Please advise thanks.

## 2016-02-20 NOTE — Telephone Encounter (Signed)
Okay to enter [facility] for pt?

## 2016-03-04 ENCOUNTER — Ambulatory Visit (INDEPENDENT_AMBULATORY_CARE_PROVIDER_SITE_OTHER): Payer: Medicare HMO | Admitting: General Practice

## 2016-03-04 DIAGNOSIS — I4891 Unspecified atrial fibrillation: Secondary | ICD-10-CM

## 2016-03-04 DIAGNOSIS — Z5181 Encounter for therapeutic drug level monitoring: Secondary | ICD-10-CM

## 2016-03-04 LAB — POCT INR: INR: 2.1

## 2016-03-04 NOTE — Progress Notes (Signed)
I have reviewed and agree with the plan. 

## 2016-04-03 ENCOUNTER — Ambulatory Visit: Payer: Medicare HMO

## 2016-04-15 ENCOUNTER — Other Ambulatory Visit: Payer: Self-pay | Admitting: Internal Medicine

## 2016-04-15 ENCOUNTER — Telehealth: Payer: Self-pay | Admitting: Internal Medicine

## 2016-04-15 DIAGNOSIS — I779 Disorder of arteries and arterioles, unspecified: Secondary | ICD-10-CM

## 2016-04-15 DIAGNOSIS — N183 Chronic kidney disease, stage 3 unspecified: Secondary | ICD-10-CM

## 2016-04-15 DIAGNOSIS — I729 Aneurysm of unspecified site: Secondary | ICD-10-CM

## 2016-04-15 DIAGNOSIS — M1000 Idiopathic gout, unspecified site: Secondary | ICD-10-CM

## 2016-04-15 DIAGNOSIS — M109 Gout, unspecified: Secondary | ICD-10-CM

## 2016-04-15 DIAGNOSIS — R600 Localized edema: Secondary | ICD-10-CM

## 2016-04-15 DIAGNOSIS — I4891 Unspecified atrial fibrillation: Secondary | ICD-10-CM

## 2016-04-15 DIAGNOSIS — M19031 Primary osteoarthritis, right wrist: Secondary | ICD-10-CM

## 2016-04-15 DIAGNOSIS — I739 Peripheral vascular disease, unspecified: Secondary | ICD-10-CM

## 2016-04-15 DIAGNOSIS — I10 Essential (primary) hypertension: Secondary | ICD-10-CM

## 2016-04-15 MED ORDER — COLCHICINE 0.6 MG PO TABS: 0.6000 mg | ORAL_TABLET | Freq: Two times a day (BID) | ORAL | 11 refills | Status: DC

## 2016-04-15 NOTE — Telephone Encounter (Signed)
Will need to put in order for [facility] and INR

## 2016-04-15 NOTE — Telephone Encounter (Signed)
RX for gout med written [facility]  Yes on the [facility] to monitor INR

## 2016-04-15 NOTE — Telephone Encounter (Signed)
Pt's daughter called with several things:  1. Pt needs gout medicine refilled and they are wondering if they could have a 30 day supply.  2. RX can go to [facility] in [place]  3. Questions if a home health nurse could come in to check INR instead of them having to bring pt in. [facility] accepts his ins   Please call Peter Congo back when possible, MS

## 2016-04-15 NOTE — Telephone Encounter (Signed)
rx faxed to [facility] in [place].

## 2016-04-17 ENCOUNTER — Ambulatory Visit (INDEPENDENT_AMBULATORY_CARE_PROVIDER_SITE_OTHER): Payer: Medicare HMO | Admitting: General Practice

## 2016-04-17 DIAGNOSIS — I4891 Unspecified atrial fibrillation: Secondary | ICD-10-CM

## 2016-04-17 DIAGNOSIS — Z5181 Encounter for therapeutic drug level monitoring: Secondary | ICD-10-CM | POA: Diagnosis not present

## 2016-04-17 LAB — POCT INR: INR: 2.6

## 2016-04-17 NOTE — Patient Instructions (Signed)
Pre visit review using our clinic review tool, if applicable. No additional management support is needed unless otherwise documented below in the visit note. 

## 2016-04-17 NOTE — Progress Notes (Signed)
I have reviewed and agree with the plan. 

## 2016-04-21 ENCOUNTER — Encounter: Payer: Self-pay | Admitting: Internal Medicine

## 2016-04-21 ENCOUNTER — Other Ambulatory Visit (INDEPENDENT_AMBULATORY_CARE_PROVIDER_SITE_OTHER): Payer: Medicare HMO

## 2016-04-21 ENCOUNTER — Ambulatory Visit (INDEPENDENT_AMBULATORY_CARE_PROVIDER_SITE_OTHER): Payer: Medicare HMO | Admitting: Internal Medicine

## 2016-04-21 VITALS — BP 130/70 | HR 58 | Temp 97.4°F | Resp 16 | Ht 71.0 in | Wt 170.1 lb

## 2016-04-21 DIAGNOSIS — K625 Hemorrhage of anus and rectum: Secondary | ICD-10-CM | POA: Diagnosis not present

## 2016-04-21 DIAGNOSIS — D539 Nutritional anemia, unspecified: Secondary | ICD-10-CM

## 2016-04-21 DIAGNOSIS — R310 Gross hematuria: Secondary | ICD-10-CM | POA: Diagnosis not present

## 2016-04-21 DIAGNOSIS — N410 Acute prostatitis: Secondary | ICD-10-CM

## 2016-04-21 LAB — CBC WITH DIFFERENTIAL/PLATELET
Basophils Absolute: 0 10*3/uL (ref 0.0–0.1)
Basophils Relative: 0.7 % (ref 0.0–3.0)
EOS PCT: 4.2 % (ref 0.0–5.0)
Eosinophils Absolute: 0.3 10*3/uL (ref 0.0–0.7)
HCT: 29.3 % — ABNORMAL LOW (ref 39.0–52.0)
HEMOGLOBIN: 9.5 g/dL — AB (ref 13.0–17.0)
Lymphocytes Relative: 29.2 % (ref 12.0–46.0)
Lymphs Abs: 1.8 10*3/uL (ref 0.7–4.0)
MCHC: 32.5 g/dL (ref 30.0–36.0)
MCV: 85.5 fl (ref 78.0–100.0)
MONOS PCT: 7.9 % (ref 3.0–12.0)
Monocytes Absolute: 0.5 10*3/uL (ref 0.1–1.0)
Neutro Abs: 3.6 10*3/uL (ref 1.4–7.7)
Neutrophils Relative %: 58 % (ref 43.0–77.0)
Platelets: 151 10*3/uL (ref 150.0–400.0)
RBC: 3.43 Mil/uL — AB (ref 4.22–5.81)
RDW: 18.6 % — ABNORMAL HIGH (ref 11.5–15.5)
WBC: 6.2 10*3/uL (ref 4.0–10.5)

## 2016-04-21 LAB — URINALYSIS, ROUTINE W REFLEX MICROSCOPIC
Bilirubin Urine: NEGATIVE
Ketones, ur: NEGATIVE
Nitrite: NEGATIVE
Specific Gravity, Urine: 1.015 (ref 1.000–1.030)
TOTAL PROTEIN, URINE-UPE24: NEGATIVE
UROBILINOGEN UA: 0.2 (ref 0.0–1.0)
Urine Glucose: NEGATIVE
pH: 5.5 (ref 5.0–8.0)

## 2016-04-21 LAB — FOLATE: FOLATE: 9.3 ng/mL (ref >5.9)

## 2016-04-21 LAB — IBC PANEL
IRON: 56 ug/dL (ref 42–165)
Saturation Ratios: 17.5 % — ABNORMAL LOW (ref 20.0–50.0)
Transferrin: 229 mg/dL (ref 212.0–360.0)

## 2016-04-21 LAB — VITAMIN B12: Vitamin B-12: 295 pg/mL (ref 211–911)

## 2016-04-21 LAB — FERRITIN: FERRITIN: 124.1 ng/mL (ref 22.0–322.0)

## 2016-04-21 NOTE — Patient Instructions (Signed)
Anemia, Nonspecific Anemia is a condition in which the concentration of red blood cells or hemoglobin in the blood is below normal. Hemoglobin is a substance in red blood cells that carries oxygen to the tissues of the body. Anemia results in not enough oxygen reaching these tissues. What are the causes? Common causes of anemia include:  Excessive bleeding. Bleeding may be internal or external. This includes excessive bleeding from periods (in women) or from the intestine.  Poor nutrition.  Chronic kidney, thyroid, and liver disease.  Bone marrow disorders that decrease red blood cell production.  Cancer and treatments for cancer.  HIV, AIDS, and their treatments.  Spleen problems that increase red blood cell destruction.  Blood disorders.  Excess destruction of red blood cells due to infection, medicines, and autoimmune disorders. What are the signs or symptoms?  Minor weakness.  Dizziness.  Headache.  Palpitations.  Shortness of breath, especially with exercise.  Paleness.  Cold sensitivity.  Indigestion.  Nausea.  Difficulty sleeping.  Difficulty concentrating. Symptoms may occur suddenly or they may develop slowly. How is this diagnosed? Additional blood tests are often needed. These help your health care provider determine the best treatment. Your health care provider will check your stool for blood and look for other causes of blood loss. How is this treated? Treatment varies depending on the cause of the anemia. Treatment can include:  Supplements of iron, vitamin B12, or folic acid.  Hormone medicines.  A blood transfusion. This may be needed if blood loss is severe.  Hospitalization. This may be needed if there is significant continual blood loss.  Dietary changes.  Spleen removal. Follow these instructions at home: Keep all follow-up appointments. It often takes many weeks to correct anemia, and having your health care provider check on your  condition and your response to treatment is very important. Get help right away if:  You develop extreme weakness, shortness of breath, or chest pain.  You become dizzy or have trouble concentrating.  You develop heavy vaginal bleeding.  You develop a rash.  You have bloody or black, tarry stools.  You faint.  You vomit up blood.  You vomit repeatedly.  You have abdominal pain.  You have a fever or persistent symptoms for more than 2-3 days.  You have a fever and your symptoms suddenly get worse.  You are dehydrated. This information is not intended to replace advice given to you by your health care provider. Make sure you discuss any questions you have with your health care provider. Document Released: 04/09/2004 Document Revised: 08/14/2015 Document Reviewed: 08/26/2012 Elsevier Interactive Patient Education  2017 Elsevier Inc.  

## 2016-04-21 NOTE — Progress Notes (Signed)
Pre visit review using our clinic review tool, if applicable. No additional management support is needed unless otherwise documented below in the visit note. 

## 2016-04-21 NOTE — Telephone Encounter (Signed)
Orders entered

## 2016-04-21 NOTE — Progress Notes (Signed)
Subjective:  Patient ID: [name], male    DOB: 02/08/1924  Age: 83 y.o. MRN: [id number]  CC: Anemia   HPI [name] presents for follow-up on anemia. He complains of fatigue, weakness, and bright red blood per rectum and blood in his urine for about a month. He is also had a few episodes of lightheadedness and shortness of breath.  Outpatient Medications Prior to Visit  Medication Sig Dispense Refill  . amLODipine (NORVASC) 5 MG tablet Take 5 mg by mouth daily.    Marland Kitchen atenolol (TENORMIN) 25 MG tablet Take 25 mg by mouth daily.    . colchicine 0.6 MG tablet Take 1 tablet (0.6 mg total) by mouth 2 (two) times daily. 60 tablet 11  . diclofenac sodium (VOLTAREN) 1 % GEL Apply 2 g topically 4 (four) times daily. 100 g 11  . digoxin (LANOXIN) 0.125 MG tablet Take 1 tablet (0.125 mg total) by mouth daily. 90 tablet 3  . Omega-3 Fatty Acids (FISH OIL) 1000 MG CAPS Take 1,000 mg by mouth daily.    . quinapril (ACCUPRIL) 10 MG tablet Take 10 mg by mouth daily.    . Tamsulosin HCl (FLOMAX) 0.4 MG CAPS Take 0.4 mg by mouth daily.    Marland Kitchen warfarin (COUMADIN) 5 MG tablet Take 2.5-5 mg by mouth daily. Take 2.5mg  on Monday and Fridays, then take 5mg  rest of days     No facility-administered medications prior to visit.     ROS Review of Systems  Constitutional: Positive for fatigue. Negative for appetite change, chills, diaphoresis and unexpected weight change.  HENT: Negative.   Eyes: Negative for visual disturbance.  Respiratory: Positive for shortness of breath. Negative for choking, chest tightness and wheezing.   Cardiovascular: Negative for chest pain, palpitations and leg swelling.  Gastrointestinal: Positive for anal bleeding. Negative for abdominal pain, blood in stool, constipation, diarrhea, nausea and vomiting.  Endocrine: Negative.   Genitourinary: Positive for dysuria and hematuria. Negative for difficulty urinating, flank pain, penile swelling, scrotal swelling, testicular pain  and urgency.  Musculoskeletal: Negative.  Negative for back pain, myalgias and neck pain.  Skin: Negative.  Negative for color change and rash.  Allergic/Immunologic: Negative.   Neurological: Positive for weakness and light-headedness. Negative for dizziness and numbness.  Hematological: Negative.  Negative for adenopathy. Does not bruise/bleed easily.  Psychiatric/Behavioral: Negative.     Objective:  BP 130/70 (BP Location: Left Arm, Patient Position: Sitting, Cuff Size: Normal)   Pulse (!) 58   Temp 97.4 F (36.3 C) (Oral)   Resp 16   Ht 5\' 11"  (1.803 m)   Wt 170 lb 1.9 oz (77.2 kg)   SpO2 97%   BMI 23.73 kg/m   BP Readings from Last 3 Encounters:  04/21/16 130/70  01/01/16 120/60  12/20/15 (!) 164/115    Wt Readings from Last 3 Encounters:  04/21/16 170 lb 1.9 oz (77.2 kg)  01/01/16 175 lb 12 oz (79.7 kg)  12/19/15 161 lb 13.1 oz (73.4 kg)    Physical Exam  Constitutional: He is oriented to person, place, and time. No distress.  HENT:  Mouth/Throat: Oropharynx is clear and moist. No oropharyngeal exudate.  Eyes: Conjunctivae are normal. Right eye exhibits no discharge. Left eye exhibits no discharge. No scleral icterus.  Neck: Normal range of motion. Neck supple. No JVD present. No tracheal deviation present. No thyromegaly present.  Cardiovascular: Normal rate, regular rhythm, normal heart sounds and intact distal pulses.  Exam reveals no gallop and  no friction rub.   No murmur heard. Pulmonary/Chest: Effort normal and breath sounds normal. No stridor. No respiratory distress. He has no wheezes. He has no rales. He exhibits no tenderness.  Abdominal: Soft. Bowel sounds are normal. He exhibits no distension and no mass. There is no tenderness. There is no rebound and no guarding.  Genitourinary: Rectal exam shows guaiac positive stool. Rectal exam shows no external hemorrhoid, no internal hemorrhoid, no fissure, no mass, no tenderness and anal tone normal. Prostate is  enlarged and tender.  Genitourinary Comments: There is a small amount of BRB around the anus and in the rectum  Musculoskeletal: Normal range of motion. He exhibits no edema, tenderness or deformity.  Lymphadenopathy:    He has no cervical adenopathy.  Neurological: He is alert and oriented to person, place, and time.  Skin: Skin is warm and dry. No rash noted. He is not diaphoretic. No erythema. No pallor.  Vitals reviewed.   Lab Results  Component Value Date   WBC 6.2 04/21/2016   HGB 9.5 (L) 04/21/2016   HCT 29.3 (L) 04/21/2016   PLT 151.0 04/21/2016   GLUCOSE 91 01/01/2016   CHOL 166 01/01/2016   TRIG 168.0 (H) 01/01/2016   HDL 34.40 (L) 01/01/2016   LDLCALC 98 01/01/2016   ALT 11 (L) 12/18/2015   AST 20 12/18/2015   NA 141 01/01/2016   K 4.5 01/01/2016   CL 109 01/01/2016   CREATININE 1.68 (H) 01/01/2016   BUN 29 (H) 01/01/2016   CO2 25 01/01/2016   TSH 5.83 (H) 01/01/2016   INR 2.6 04/17/2016   HGBA1C 6.3 01/01/2016    No results found.  Assessment & Plan:   [name] was seen today for anemia.  Diagnoses and all orders for this visit:  Deficiency anemia- His H&H have actually improved slightly. He has mild B12 deficiency which I will treat with an oral B12 supplement. -     IBC panel; Future -     Cancel: CBC with Differential/Platelet; Future -     Vitamin B12; Future -     Folate; Future -     Ferritin; Future -     CBC with Differential/Platelet; Future  Bright red rectal bleeding- I've asked him to see GI to see if he needs a lower GI procedure to try to identify was causing his bleeding. -     Cancel: CBC with Differential/Platelet; Future -     CBC with Differential/Platelet; Future -     Ambulatory referral to Gastroenterology  Hematuria, gross- his urinalysis is positive for white blood cells, concerned he has acute prostatitis and will start treating with Bactrim DS. -     Urinalysis, Routine w reflex microscopic; Future -     CBC with  Differential/Platelet; Future -     CULTURE, URINE COMPREHENSIVE; Future  Acute prostatitis with hematuria- I will order a urine culture and empirically start treating with Bactrim DS. He takes Coumadin so I've asked him to return in about 2 weeks for me to recheck him and monitor his PT/INR. -     CULTURE, URINE COMPREHENSIVE; Future -     sulfamethoxazole-trimethoprim (BACTRIM DS,SEPTRA DS) 800-160 MG tablet; Take 1 tablet by mouth 2 (two) times daily.   I am having Mr. Mencer start on sulfamethoxazole-trimethoprim. I am also having him maintain his tamsulosin, Fish Oil, amLODipine, atenolol, quinapril, digoxin, diclofenac sodium, warfarin, and colchicine.  Meds ordered this encounter  Medications  . sulfamethoxazole-trimethoprim (BACTRIM DS,SEPTRA DS) 800-160 MG  tablet    Sig: Take 1 tablet by mouth 2 (two) times daily.    Dispense:  28 tablet    Refill:  0     Follow-up: Return if symptoms worsen or fail to improve.  Scarlette Calico, MD

## 2016-04-22 ENCOUNTER — Other Ambulatory Visit: Payer: Medicare HMO

## 2016-04-22 ENCOUNTER — Telehealth: Payer: Self-pay

## 2016-04-22 DIAGNOSIS — N410 Acute prostatitis: Secondary | ICD-10-CM | POA: Diagnosis not present

## 2016-04-22 DIAGNOSIS — R310 Gross hematuria: Secondary | ICD-10-CM | POA: Diagnosis not present

## 2016-04-22 DIAGNOSIS — B952 Enterococcus as the cause of diseases classified elsewhere: Secondary | ICD-10-CM | POA: Insufficient documentation

## 2016-04-22 DIAGNOSIS — R319 Hematuria, unspecified: Secondary | ICD-10-CM

## 2016-04-22 DIAGNOSIS — N390 Urinary tract infection, site not specified: Secondary | ICD-10-CM

## 2016-04-22 MED ORDER — SULFAMETHOXAZOLE-TRIMETHOPRIM 800-160 MG PO TABS: 1.0000 | ORAL_TABLET | Freq: Two times a day (BID) | ORAL | 0 refills | Status: AC

## 2016-04-22 NOTE — Telephone Encounter (Signed)
error 

## 2016-04-23 ENCOUNTER — Ambulatory Visit (INDEPENDENT_AMBULATORY_CARE_PROVIDER_SITE_OTHER): Payer: Medicare HMO | Admitting: General Practice

## 2016-04-23 DIAGNOSIS — E538 Deficiency of other specified B group vitamins: Secondary | ICD-10-CM

## 2016-04-23 MED ORDER — CYANOCOBALAMIN 1000 MCG/ML IJ SOLN
1000.0000 ug | Freq: Once | INTRAMUSCULAR | Status: AC
  Administered 2016-04-23: 1000 ug via INTRAMUSCULAR

## 2016-04-23 NOTE — Patient Instructions (Signed)
Please adjust your warfarin dosage to 2.5 mg daily until you finish the antibiotic (Bactrim).  After you finish the Bactrim please resume your current dosage of warfarin.  I am providing you with a calendar with your warfarin dosage until you see me Villa Herb, RN) in the coumadin clinic on 3/2.  Please call if you have any unusual bleeding or bruising.  Instructions given to patient and patient's son.  Both verbalized understanding.  B-12 will also be given at next visit.

## 2016-04-24 LAB — CULTURE, URINE COMPREHENSIVE

## 2016-04-25 ENCOUNTER — Other Ambulatory Visit: Payer: Self-pay | Admitting: Internal Medicine

## 2016-04-25 DIAGNOSIS — B952 Enterococcus as the cause of diseases classified elsewhere: Secondary | ICD-10-CM

## 2016-04-25 DIAGNOSIS — N390 Urinary tract infection, site not specified: Principal | ICD-10-CM

## 2016-04-25 MED ORDER — AMPICILLIN 500 MG PO CAPS: 500.0000 mg | ORAL_CAPSULE | Freq: Four times a day (QID) | ORAL | 0 refills | Status: AC

## 2016-04-28 DIAGNOSIS — R319 Hematuria, unspecified: Secondary | ICD-10-CM | POA: Diagnosis not present

## 2016-04-28 DIAGNOSIS — E538 Deficiency of other specified B group vitamins: Secondary | ICD-10-CM | POA: Diagnosis not present

## 2016-04-28 DIAGNOSIS — Z5181 Encounter for therapeutic drug level monitoring: Secondary | ICD-10-CM | POA: Diagnosis not present

## 2016-04-28 DIAGNOSIS — N410 Acute prostatitis: Secondary | ICD-10-CM | POA: Diagnosis not present

## 2016-04-28 DIAGNOSIS — D649 Anemia, unspecified: Secondary | ICD-10-CM | POA: Diagnosis not present

## 2016-04-28 DIAGNOSIS — K625 Hemorrhage of anus and rectum: Secondary | ICD-10-CM | POA: Diagnosis not present

## 2016-05-04 DIAGNOSIS — N410 Acute prostatitis: Secondary | ICD-10-CM | POA: Diagnosis not present

## 2016-05-04 DIAGNOSIS — R319 Hematuria, unspecified: Secondary | ICD-10-CM | POA: Diagnosis not present

## 2016-05-04 DIAGNOSIS — D649 Anemia, unspecified: Secondary | ICD-10-CM | POA: Diagnosis not present

## 2016-05-04 DIAGNOSIS — K625 Hemorrhage of anus and rectum: Secondary | ICD-10-CM | POA: Diagnosis not present

## 2016-05-04 DIAGNOSIS — E538 Deficiency of other specified B group vitamins: Secondary | ICD-10-CM | POA: Diagnosis not present

## 2016-05-04 DIAGNOSIS — Z5181 Encounter for therapeutic drug level monitoring: Secondary | ICD-10-CM | POA: Diagnosis not present

## 2016-05-11 DIAGNOSIS — N410 Acute prostatitis: Secondary | ICD-10-CM | POA: Diagnosis not present

## 2016-05-11 DIAGNOSIS — D649 Anemia, unspecified: Secondary | ICD-10-CM | POA: Diagnosis not present

## 2016-05-11 DIAGNOSIS — R319 Hematuria, unspecified: Secondary | ICD-10-CM | POA: Diagnosis not present

## 2016-05-11 DIAGNOSIS — E538 Deficiency of other specified B group vitamins: Secondary | ICD-10-CM | POA: Diagnosis not present

## 2016-05-11 DIAGNOSIS — Z5181 Encounter for therapeutic drug level monitoring: Secondary | ICD-10-CM | POA: Diagnosis not present

## 2016-05-11 DIAGNOSIS — K625 Hemorrhage of anus and rectum: Secondary | ICD-10-CM | POA: Diagnosis not present

## 2016-05-15 ENCOUNTER — Ambulatory Visit: Payer: Medicare HMO

## 2016-05-18 DIAGNOSIS — D649 Anemia, unspecified: Secondary | ICD-10-CM | POA: Diagnosis not present

## 2016-05-18 DIAGNOSIS — E538 Deficiency of other specified B group vitamins: Secondary | ICD-10-CM | POA: Diagnosis not present

## 2016-05-18 DIAGNOSIS — N410 Acute prostatitis: Secondary | ICD-10-CM | POA: Diagnosis not present

## 2016-05-18 DIAGNOSIS — Z5181 Encounter for therapeutic drug level monitoring: Secondary | ICD-10-CM | POA: Diagnosis not present

## 2016-05-18 DIAGNOSIS — R319 Hematuria, unspecified: Secondary | ICD-10-CM | POA: Diagnosis not present

## 2016-05-18 DIAGNOSIS — K625 Hemorrhage of anus and rectum: Secondary | ICD-10-CM | POA: Diagnosis not present

## 2016-05-25 DIAGNOSIS — N410 Acute prostatitis: Secondary | ICD-10-CM | POA: Diagnosis not present

## 2016-05-25 DIAGNOSIS — D649 Anemia, unspecified: Secondary | ICD-10-CM | POA: Diagnosis not present

## 2016-05-25 DIAGNOSIS — Z5181 Encounter for therapeutic drug level monitoring: Secondary | ICD-10-CM | POA: Diagnosis not present

## 2016-05-25 DIAGNOSIS — K625 Hemorrhage of anus and rectum: Secondary | ICD-10-CM | POA: Diagnosis not present

## 2016-05-25 DIAGNOSIS — R319 Hematuria, unspecified: Secondary | ICD-10-CM | POA: Diagnosis not present

## 2016-05-25 DIAGNOSIS — E538 Deficiency of other specified B group vitamins: Secondary | ICD-10-CM | POA: Diagnosis not present

## 2016-05-29 ENCOUNTER — Ambulatory Visit: Payer: Medicare HMO

## 2016-06-04 ENCOUNTER — Telehealth: Payer: Self-pay | Admitting: Internal Medicine

## 2016-06-04 NOTE — Telephone Encounter (Signed)
Requesting refill on warfarin.  Patient uses [facility] on [address].   Also requesting referral to be entered with [facility].  States last visit with them was today.  They were helping with coum check and administering meds.  Daughter would like this to continue due to the fact that patient is not very mobile right now.       Please follow up in regard.

## 2016-06-04 NOTE — Telephone Encounter (Signed)
States coum has been 2.2 and steady for past 30 days.  Taking 2.5mg  Monday and Friday.  5mg  all other days. Discharging patient b/c there are no longer any other skilled nursing that can be done.  (See previous phone note from daughter)

## 2016-06-07 ENCOUNTER — Other Ambulatory Visit: Payer: Self-pay | Admitting: Internal Medicine

## 2016-06-07 DIAGNOSIS — I480 Paroxysmal atrial fibrillation: Secondary | ICD-10-CM

## 2016-06-07 MED ORDER — WARFARIN SODIUM 5 MG PO TABS: 2.5000 mg | ORAL_TABLET | Freq: Every day | ORAL | 2 refills | Status: DC

## 2016-06-15 ENCOUNTER — Telehealth: Payer: Self-pay | Admitting: Internal Medicine

## 2016-06-15 DIAGNOSIS — N183 Chronic kidney disease, stage 3 unspecified: Secondary | ICD-10-CM

## 2016-06-15 DIAGNOSIS — I480 Paroxysmal atrial fibrillation: Secondary | ICD-10-CM

## 2016-06-15 DIAGNOSIS — I10 Essential (primary) hypertension: Secondary | ICD-10-CM

## 2016-06-15 NOTE — Telephone Encounter (Signed)
Pt daughter called in and they are wanting to renew his home health [facility]

## 2016-06-16 ENCOUNTER — Telehealth: Payer: Self-pay | Admitting: General Practice

## 2016-06-16 NOTE — Telephone Encounter (Signed)
Ordered enter per request

## 2016-06-16 NOTE — Telephone Encounter (Signed)
I am also going to check into getting patient set up with a home monitoring service for his INR.

## 2016-06-16 NOTE — Telephone Encounter (Signed)
Dr. Ronnald Ramp,    I spoke with patient's daughter, Peter Congo.  She wants to know if you could order home health for medication management.  If this is a possibility let me know and I will contact her.  She said this would be a huge help for the family.  I told her I would ask!  Thanks!

## 2016-06-17 ENCOUNTER — Ambulatory Visit (INDEPENDENT_AMBULATORY_CARE_PROVIDER_SITE_OTHER): Payer: Medicare HMO | Admitting: General Practice

## 2016-06-17 ENCOUNTER — Other Ambulatory Visit: Payer: Self-pay | Admitting: Internal Medicine

## 2016-06-17 DIAGNOSIS — Z5181 Encounter for therapeutic drug level monitoring: Secondary | ICD-10-CM | POA: Diagnosis not present

## 2016-06-17 DIAGNOSIS — I4891 Unspecified atrial fibrillation: Secondary | ICD-10-CM

## 2016-06-17 LAB — POCT INR: INR: 1.3

## 2016-06-17 NOTE — Patient Instructions (Signed)
Pre visit review using our clinic review tool, if applicable. No additional management support is needed unless otherwise documented below in the visit note. 

## 2016-06-22 ENCOUNTER — Telehealth: Payer: Self-pay | Admitting: Internal Medicine

## 2016-06-22 DIAGNOSIS — M1000 Idiopathic gout, unspecified site: Secondary | ICD-10-CM | POA: Diagnosis not present

## 2016-06-22 DIAGNOSIS — N183 Chronic kidney disease, stage 3 (moderate): Secondary | ICD-10-CM | POA: Diagnosis not present

## 2016-06-22 DIAGNOSIS — I480 Paroxysmal atrial fibrillation: Secondary | ICD-10-CM | POA: Diagnosis not present

## 2016-06-22 DIAGNOSIS — I129 Hypertensive chronic kidney disease with stage 1 through stage 4 chronic kidney disease, or unspecified chronic kidney disease: Secondary | ICD-10-CM | POA: Diagnosis not present

## 2016-06-22 DIAGNOSIS — I059 Rheumatic mitral valve disease, unspecified: Secondary | ICD-10-CM | POA: Diagnosis not present

## 2016-06-22 DIAGNOSIS — M1991 Primary osteoarthritis, unspecified site: Secondary | ICD-10-CM | POA: Diagnosis not present

## 2016-06-22 NOTE — Telephone Encounter (Signed)
Noted  

## 2016-06-22 NOTE — Telephone Encounter (Signed)
The home health nurse that was with the pt today called to check on the biweekly INR order. He wanted to let you know that because he had one done on 06/17/16 and it was 1.3 he is going to check another one next week.

## 2016-06-23 ENCOUNTER — Other Ambulatory Visit: Payer: Self-pay | Admitting: *Deleted

## 2016-06-23 NOTE — Patient Outreach (Signed)
[facility]) Care Management  06/23/2016  [name] 11-07-1923 [id number]  Telephone Screen  Referral Date: 06/23/2016 Referral Source: [place] [facility] Referral Reason: "Medication Assistance from pharmacy" Insurance: [facility]   Outreach attempt # 1 To patient. Patient hard of hearing. He had difficulty understanding RN CM. Patient gave verbal consent to contact his daughter. Outreach caled to daughter [name]). No answer at present.    Plan: RNCM will make outreach attempt to daughter again within 3 business days.   Lake Bells, RN, BSN, MHA/MSL, [place] Telephonic Care Manager Coordinator [facility] Direct Phone: [phone] Toll Free: [phone] Fax: [phone]

## 2016-06-25 ENCOUNTER — Other Ambulatory Visit: Payer: Self-pay | Admitting: *Deleted

## 2016-06-25 NOTE — Patient Outreach (Signed)
[facility]) Care Management  06/25/2016  NOTNAMED SCHOLZ [birth year] [id number]   Telephone Screen  Referral Date: 06/23/2016 Referral Source: [place] [facility] Referral Reason: "Medication Assistance from pharmacy" Insurance: [facility] Medicare  Outreach attempt #2 To daughter [name]) No answer at both numbers listed, uanble to leave voicemail.  Plan: RNCM will make outreach attempt to daughter again within 3 business days.   Lake Bells, RN, BSN, MHA/MSL, [place] Telephonic Care Manager Coordinator [facility] Direct Phone: [phone] Toll Free: [phone] Fax: [phone]

## 2016-06-25 NOTE — Patient Outreach (Signed)
[place] [facility]) Care Management  06/25/2016  [name] 1923-05-22 [id number]   Care Coordination:    Voicemail message received from [place] [phone]), regarding patient. HHRN wants to coordinate services with [facility] for home visit. Return call placed to [facility] to discuss patient case. Due to patient being difficult to reach, [facility] willing to coordinate visits with [facility]. HHRN will possibly discharge from [facility] services within 3 weeks.   Plan:  RNCM has already sent [facility] referrals and requested that visits be coordinated with [place].   Lake Bells, RN, BSN, MHA/MSL, [place] Telephonic Care Manager Coordinator [facility] Direct Phone: [phone] Toll Free: [phone] Fax: [phone]

## 2016-06-25 NOTE — Patient Outreach (Addendum)
[place] [facility]) Care Management  06/25/2016  [name] Sep 19, 1923 [id number]   Telephone Screen  Referral Date: 06/23/2016 Referral Source: [place] Service Referral Reason: "Medication Assistance from pharmacy" Insurance: [facility]    Incoming call from daughter [name]. ROI on file to speak with daughter. Screening call completed.  Social:  Patient lives alone. He is extremely hard of hearing. Patient is independent with ADLs. He cooks breakfast every morning (sausage, eggs, and toast). He receives a meal from [employer] 5 days/wk. His children provide transportation to all of his medical appointments. He uses a cane and a rolling walker at all times. On occasions, he uses a wheelchair for ambulation to medical appointments due to instability when walking. [place] Nurse [name]) made the referral for medication assistance. Per daughter, [name] next home visit is 06/29/16 at 2pm. Daughter recommends coordinating next visit with [facility] visits as patient doesn't do well with new people.  Conditions:  Pt has a history of Atrial Fibrillation, HTN, Chronic Kidney disease, stage 3, Coronary Artery Disease, Deficiency Anemia, Aneurysm, and Rectal Bleed.   Medications: Daughter stated patient had an INR checked last week. His INR results were 1.3. Home Health nurse has some concerns about patient being able to manage his medications. Patient switched pharmacies and patient has confusion regarding recognizing medications. Patient is taking a total of 7 medications. His children and HHRN attempts to help patient manage medications via med planner. Patient is able to afford medications, per daughter [name]).  Appointments: Patient has a audiologist appointment today at the [facility] hospital in [place]. He is followed by a urologist and cardiologist. Both are here in [place]. Daughter can't recall their names. Patient last visit with PCP was  04/21/16. Advanced Directives: Per daughter, patient has a Scientist, physiological. No copy on file.   Consent: [facility] services reviewed and discussed with [name]. Verbal consent for services given.  Daughter stated she can help coordinate visits with patient. Provided with son contact information [name], [phone]), who is available to assist.  Plan: RNCM will send [place] referral for medication management. RNCM will send [place] referral for further in-home evaluation and assessment of care needs.   Lake Bells, RN, BSN, MHA/MSL, [place] Telephonic Care Manager Coordinator [facility] Direct Phone: [phone] Toll Free: [phone] Fax: [phone]

## 2016-06-29 ENCOUNTER — Ambulatory Visit (INDEPENDENT_AMBULATORY_CARE_PROVIDER_SITE_OTHER): Payer: Medicare HMO | Admitting: General Practice

## 2016-06-29 ENCOUNTER — Ambulatory Visit: Payer: Self-pay

## 2016-06-29 DIAGNOSIS — I480 Paroxysmal atrial fibrillation: Secondary | ICD-10-CM | POA: Diagnosis not present

## 2016-06-29 DIAGNOSIS — M1000 Idiopathic gout, unspecified site: Secondary | ICD-10-CM | POA: Diagnosis not present

## 2016-06-29 DIAGNOSIS — I059 Rheumatic mitral valve disease, unspecified: Secondary | ICD-10-CM | POA: Diagnosis not present

## 2016-06-29 DIAGNOSIS — N183 Chronic kidney disease, stage 3 (moderate): Secondary | ICD-10-CM | POA: Diagnosis not present

## 2016-06-29 DIAGNOSIS — M1991 Primary osteoarthritis, unspecified site: Secondary | ICD-10-CM | POA: Diagnosis not present

## 2016-06-29 DIAGNOSIS — I129 Hypertensive chronic kidney disease with stage 1 through stage 4 chronic kidney disease, or unspecified chronic kidney disease: Secondary | ICD-10-CM | POA: Diagnosis not present

## 2016-06-29 LAB — POCT INR: INR: 1.6

## 2016-06-29 NOTE — Progress Notes (Signed)
I agree with this plan.

## 2016-06-29 NOTE — Patient Instructions (Signed)
Pre visit review using our clinic review tool, if applicable. No additional management support is needed unless otherwise documented below in the visit note. 

## 2016-06-30 ENCOUNTER — Ambulatory Visit: Payer: Self-pay

## 2016-07-03 ENCOUNTER — Ambulatory Visit: Payer: Medicare HMO

## 2016-07-03 ENCOUNTER — Other Ambulatory Visit: Payer: Self-pay

## 2016-07-03 NOTE — Patient Outreach (Signed)
[place] [facility]) Care Management   07/03/16  CADAN MAGGART February 15, 1924 [id number]  RNCM reached out to [place], patient's [facility]. Scheduled a co-visit for next week so that RNCM can complete an initial assessment and identify any needs.  Plan: RNCM will complete initial assessment next week.  [place] R. Malayia Spizzirri, RN, BSN, [place] Management Coordinator [phone]

## 2016-07-04 DIAGNOSIS — I729 Aneurysm of unspecified site: Secondary | ICD-10-CM | POA: Diagnosis not present

## 2016-07-04 DIAGNOSIS — I059 Rheumatic mitral valve disease, unspecified: Secondary | ICD-10-CM | POA: Diagnosis not present

## 2016-07-04 DIAGNOSIS — M1991 Primary osteoarthritis, unspecified site: Secondary | ICD-10-CM | POA: Diagnosis not present

## 2016-07-04 DIAGNOSIS — I129 Hypertensive chronic kidney disease with stage 1 through stage 4 chronic kidney disease, or unspecified chronic kidney disease: Secondary | ICD-10-CM | POA: Diagnosis not present

## 2016-07-04 DIAGNOSIS — N183 Chronic kidney disease, stage 3 (moderate): Secondary | ICD-10-CM | POA: Diagnosis not present

## 2016-07-04 DIAGNOSIS — Z7901 Long term (current) use of anticoagulants: Secondary | ICD-10-CM | POA: Diagnosis not present

## 2016-07-04 DIAGNOSIS — D539 Nutritional anemia, unspecified: Secondary | ICD-10-CM | POA: Diagnosis not present

## 2016-07-04 DIAGNOSIS — Z5181 Encounter for therapeutic drug level monitoring: Secondary | ICD-10-CM | POA: Diagnosis not present

## 2016-07-04 DIAGNOSIS — I480 Paroxysmal atrial fibrillation: Secondary | ICD-10-CM | POA: Diagnosis not present

## 2016-07-04 DIAGNOSIS — M1000 Idiopathic gout, unspecified site: Secondary | ICD-10-CM | POA: Diagnosis not present

## 2016-07-04 DIAGNOSIS — I779 Disorder of arteries and arterioles, unspecified: Secondary | ICD-10-CM | POA: Diagnosis not present

## 2016-07-06 ENCOUNTER — Ambulatory Visit (INDEPENDENT_AMBULATORY_CARE_PROVIDER_SITE_OTHER): Payer: Medicare HMO | Admitting: General Practice

## 2016-07-06 ENCOUNTER — Ambulatory Visit: Payer: Self-pay

## 2016-07-06 ENCOUNTER — Telehealth: Payer: Self-pay | Admitting: Internal Medicine

## 2016-07-06 ENCOUNTER — Other Ambulatory Visit: Payer: Self-pay | Admitting: General Practice

## 2016-07-06 DIAGNOSIS — M1000 Idiopathic gout, unspecified site: Secondary | ICD-10-CM | POA: Diagnosis not present

## 2016-07-06 DIAGNOSIS — N183 Chronic kidney disease, stage 3 (moderate): Secondary | ICD-10-CM | POA: Diagnosis not present

## 2016-07-06 DIAGNOSIS — I4891 Unspecified atrial fibrillation: Secondary | ICD-10-CM

## 2016-07-06 DIAGNOSIS — M1991 Primary osteoarthritis, unspecified site: Secondary | ICD-10-CM | POA: Diagnosis not present

## 2016-07-06 DIAGNOSIS — I129 Hypertensive chronic kidney disease with stage 1 through stage 4 chronic kidney disease, or unspecified chronic kidney disease: Secondary | ICD-10-CM | POA: Diagnosis not present

## 2016-07-06 DIAGNOSIS — I059 Rheumatic mitral valve disease, unspecified: Secondary | ICD-10-CM | POA: Diagnosis not present

## 2016-07-06 DIAGNOSIS — Z5181 Encounter for therapeutic drug level monitoring: Secondary | ICD-10-CM

## 2016-07-06 DIAGNOSIS — I480 Paroxysmal atrial fibrillation: Secondary | ICD-10-CM | POA: Diagnosis not present

## 2016-07-06 LAB — POCT INR: INR: 2.6

## 2016-07-06 MED ORDER — WARFARIN SODIUM 5 MG PO TABS: ORAL_TABLET | ORAL | 0 refills | Status: DC

## 2016-07-06 NOTE — Telephone Encounter (Signed)
Pt daughter called in and would like refill for his warfarin (COUMADIN) 5 MG tablet [[id number]]  Sent to [facility] mail order pharmacy

## 2016-07-06 NOTE — Progress Notes (Signed)
I agree with this plan.

## 2016-07-06 NOTE — Patient Instructions (Signed)
Pre visit review using our clinic review tool, if applicable. No additional management support is needed unless otherwise documented below in the visit note. 

## 2016-07-11 IMAGING — DX DG CHEST 1V PORT
1 series · 1 of 1 positions shown · non-contrast
Comparison: 02/24/2012

CLINICAL DATA: Neck pain, back pain, chest pain

EXAM:
PORTABLE CHEST 1 VIEW

[chest ap]
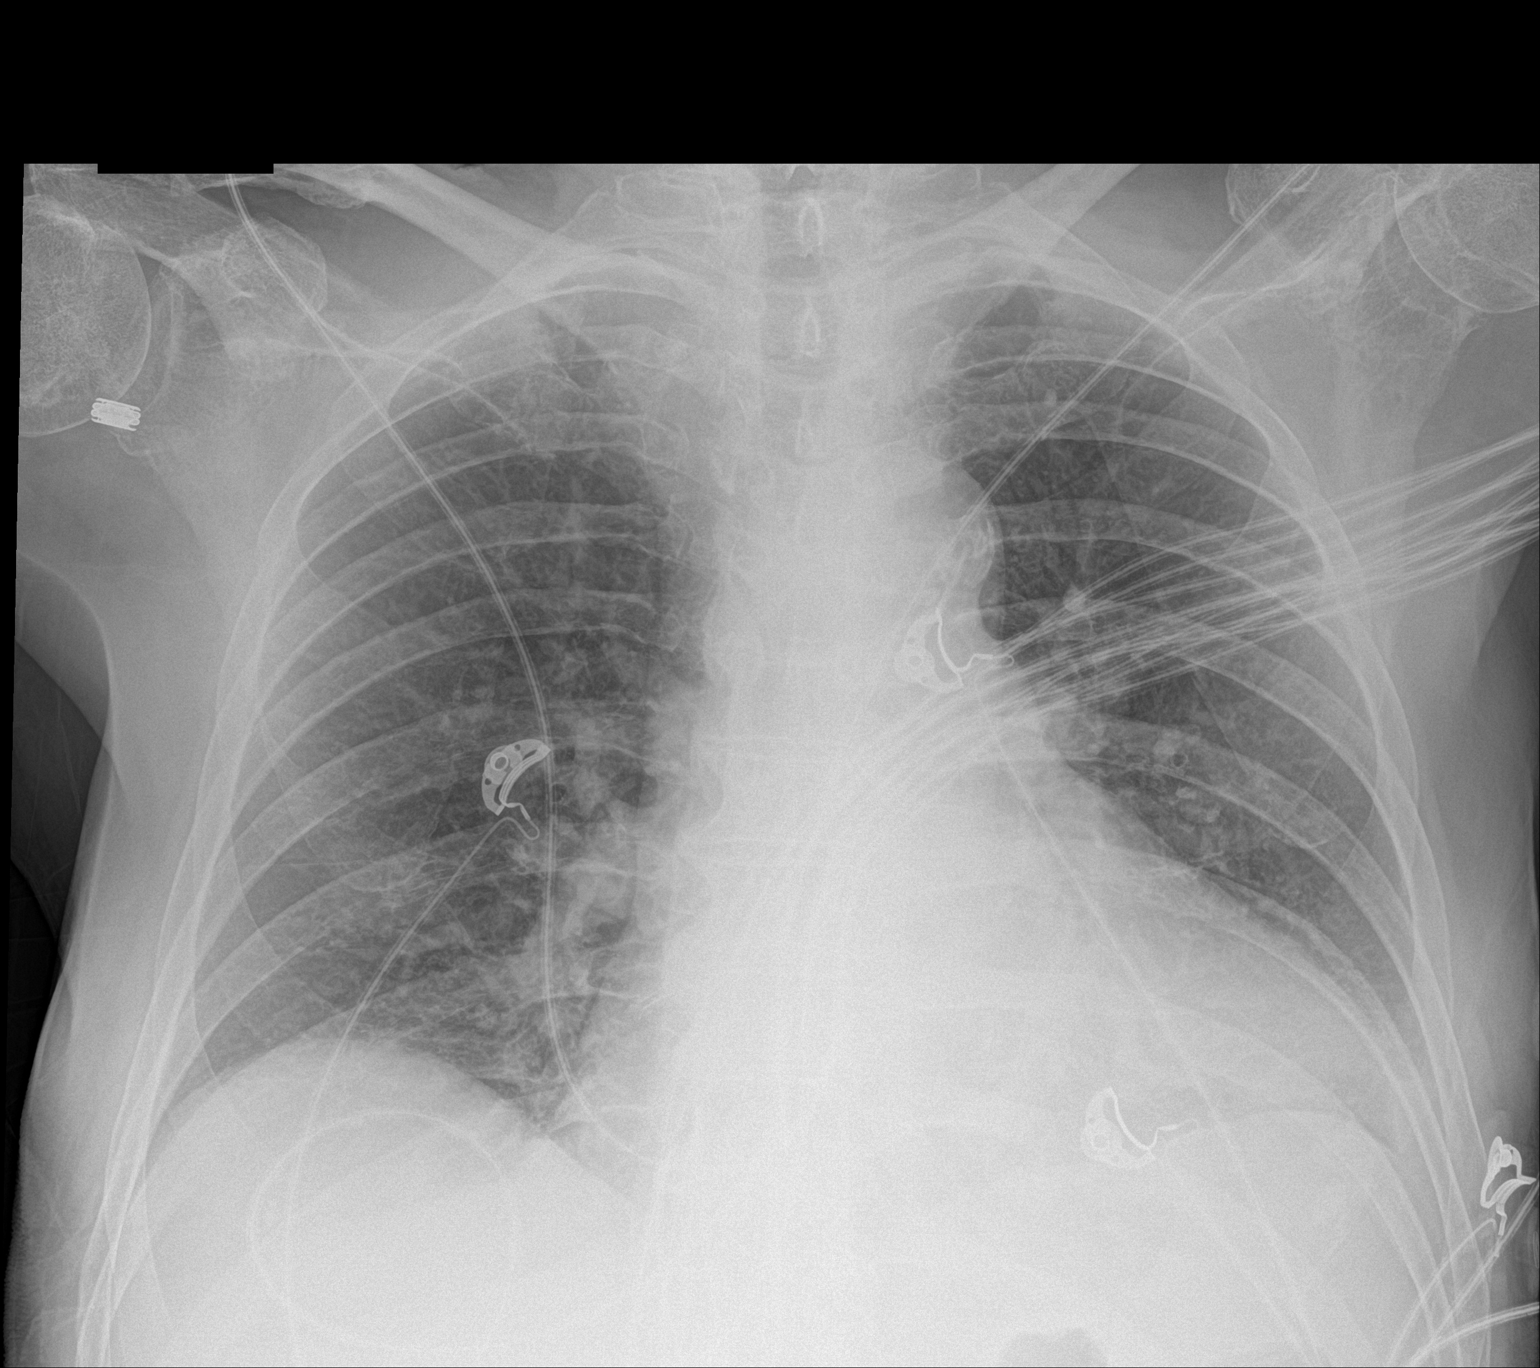

[1 of 1 positions shown; findings below may reference images not displayed]

FINDINGS: Cardiomegaly again noted. No pulmonary edema. There is trace left
pleural effusion with left basilar atelectasis or infiltrate.
Degenerative changes thoracic spine.
IMPRESSION: No pulmonary edema. Trace left pleural effusion with left basilar
atelectasis or infiltrate.

## 2016-07-13 ENCOUNTER — Ambulatory Visit (INDEPENDENT_AMBULATORY_CARE_PROVIDER_SITE_OTHER): Payer: Medicare HMO | Admitting: General Practice

## 2016-07-13 ENCOUNTER — Other Ambulatory Visit: Payer: Self-pay

## 2016-07-13 DIAGNOSIS — I129 Hypertensive chronic kidney disease with stage 1 through stage 4 chronic kidney disease, or unspecified chronic kidney disease: Secondary | ICD-10-CM | POA: Diagnosis not present

## 2016-07-13 DIAGNOSIS — I480 Paroxysmal atrial fibrillation: Secondary | ICD-10-CM | POA: Diagnosis not present

## 2016-07-13 DIAGNOSIS — M1991 Primary osteoarthritis, unspecified site: Secondary | ICD-10-CM | POA: Diagnosis not present

## 2016-07-13 DIAGNOSIS — I4891 Unspecified atrial fibrillation: Secondary | ICD-10-CM

## 2016-07-13 DIAGNOSIS — Z5181 Encounter for therapeutic drug level monitoring: Secondary | ICD-10-CM

## 2016-07-13 DIAGNOSIS — M1000 Idiopathic gout, unspecified site: Secondary | ICD-10-CM | POA: Diagnosis not present

## 2016-07-13 DIAGNOSIS — I059 Rheumatic mitral valve disease, unspecified: Secondary | ICD-10-CM | POA: Diagnosis not present

## 2016-07-13 DIAGNOSIS — N183 Chronic kidney disease, stage 3 (moderate): Secondary | ICD-10-CM | POA: Diagnosis not present

## 2016-07-13 LAB — POCT INR: INR: 2.3

## 2016-07-13 NOTE — Patient Outreach (Signed)
[facility]) Care Management   07/13/2016  [name] [birth year] [id number]  [name] is an 83 y.o. male  Subjective:   Objective:  Blood pressure 124/68, pulse 88, height 1.803 m (5\' 11" ), weight 173 lb (78.5 kg), SpO2 98 %. Review of Systems  Constitutional: Negative.   HENT: Positive for hearing loss.   Eyes: Negative.   Respiratory: Negative.   Cardiovascular: Positive for leg swelling.  Gastrointestinal: Negative.   Genitourinary: Negative.   Musculoskeletal: Negative.   Skin: Negative.   Neurological: Negative.   Endo/Heme/Allergies: Bruises/bleeds easily.  Psychiatric/Behavioral: Negative.     Physical Exam  Constitutional: He is oriented to person, place, and time. He appears well-developed. He is cooperative.  HENT:  Right Ear: Decreased hearing is noted.  Left Ear: Decreased hearing is noted.  Neck: No JVD present.  Cardiovascular: Normal rate, regular rhythm and normal heart sounds.   Respiratory: Effort normal and breath sounds normal.  Neurological: He is alert and oriented to person, place, and time.  Skin: Skin is warm, dry and intact.  Psychiatric: He has a normal mood and affect. His speech is normal and behavior is normal. Judgment and thought content normal. Cognition and memory are normal.    Encounter Medications:   Outpatient Encounter Prescriptions as of 07/13/2016  Medication Sig Note  . allopurinol (ZYLOPRIM) 100 MG tablet Take 100 mg by mouth daily. Take 2 tablets by mouth daily for gout prevention   . amLODipine (NORVASC) 5 MG tablet Take 5 mg by mouth daily.   Marland Kitchen atenolol (TENORMIN) 25 MG tablet Take 25 mg by mouth daily.   . colchicine 0.6 MG tablet Take 1 tablet (0.6 mg total) by mouth 2 (two) times daily.   . diclofenac sodium (VOLTAREN) 1 % GEL Apply 2 g topically 4 (four) times daily.   . digoxin (LANOXIN) 0.125 MG tablet Take 1 tablet (0.125 mg total) by mouth daily.   Marland Kitchen lisinopril (PRINIVIL,ZESTRIL) 40 MG  tablet Take 40 mg by mouth daily.   . Omega-3 Fatty Acids (FISH OIL) 1000 MG CAPS Take 1,000 mg by mouth daily. 07/13/2016: Patient is taking 1200 mg daily   . Tamsulosin HCl (FLOMAX) 0.4 MG CAPS Take 0.4 mg by mouth daily.   Marland Kitchen warfarin (COUMADIN) 5 MG tablet Take as directed by anticoagulation clinic.   . quinapril (ACCUPRIL) 10 MG tablet Take 10 mg by mouth daily. 07/13/2016: Patient is taking lisinopril in the place of quinapril   No facility-administered encounter medications on file as of 07/13/2016.     Functional Status:   In your present state of health, do you have any difficulty performing the following activities: 07/13/2016  Hearing? Y  Vision? N  Difficulty concentrating or making decisions? Y  Walking or climbing stairs? Y  Dressing or bathing? N  Doing errands, shopping? Y  Preparing Food and eating ? N  Using the Toilet? N  In the past six months, have you accidently leaked urine? N  Do you have problems with loss of bowel control? N  Managing your Medications? Y  Managing your Finances? N  Housekeeping or managing your Housekeeping? Y  Some recent data might be hidden    Fall/Depression Screening:    PHQ 2/9 Scores 07/13/2016 07/25/2015  PHQ - 2 Score 0 0    Assessment:   Home co-visit completed with Ruben Abide, RN with [name]. [name] has been following patient weekly for in-home INR checks and reports them to patient's Coumadin clinic at  [place]. Today's INR is 2.3. Initial assessment completed. Patient stated that he has no complaints. He stated that the issue he has most difficulty with is getting around. He reported that he used to be a runner. He reported that he had hip replacement surgery 12-15 years ago and continues to have arthritis in his right hip. He has a straight cane and a rolling walker, but does not typically use them in the home. He stated that he usually holds on to the walls when ambulating down the hall. Patient also stated that he has arthritis in  his right wrist. Today's weight is 173 lbs. He does have some edema in bilateral lower extremities, but patient reported this is his normal.  Patient is very hard of hearing. He has a lot of difficulty talking on the phone due to the inability to hear who he is talking with. He reported that he has an appointment on May 12 at the [facility] to check on his hearing aides.  He does not have any complaints or concerns. RNCM spoke with [name] who stated that the main reason for the referral to [name] was because he will be closing his case with the patient soon and they need someone to follow up regarding patient's pillbox. Home health has been filling his pillbox and stated that he will need someone to do this once he is no longer seeing the patient. He does state that patient has a 30 day pillbox and only takes his medications once a day. He stated that patient does occasionally miss a day here or there, but has been doing very well with taking his medications lately. A referral to [place] was also made by [name]. Patient will need to have his INR checked in the Coumadin clinic. Patient stated that usually his daughter or son takes him to his appointments and currently denies any needs for assistance with transportation.   Plan:  Follow up home visit in a couple of weeks and will have pharmacist accompany RNCM to introduce to patient. Will continue to assess for needs.  [place] R. Antanasia Kaczynski, RN, BSN, [place] Management Coordinator [phone]

## 2016-07-13 NOTE — Patient Instructions (Signed)
Pre visit review using our clinic review tool, if applicable. No additional management support is needed unless otherwise documented below in the visit note. 

## 2016-07-13 NOTE — Progress Notes (Signed)
I agree with this plan.

## 2016-07-20 ENCOUNTER — Ambulatory Visit (INDEPENDENT_AMBULATORY_CARE_PROVIDER_SITE_OTHER): Payer: Medicare HMO | Admitting: General Practice

## 2016-07-20 DIAGNOSIS — Z5181 Encounter for therapeutic drug level monitoring: Secondary | ICD-10-CM

## 2016-07-20 DIAGNOSIS — N183 Chronic kidney disease, stage 3 (moderate): Secondary | ICD-10-CM | POA: Diagnosis not present

## 2016-07-20 DIAGNOSIS — I480 Paroxysmal atrial fibrillation: Secondary | ICD-10-CM | POA: Diagnosis not present

## 2016-07-20 DIAGNOSIS — M1991 Primary osteoarthritis, unspecified site: Secondary | ICD-10-CM | POA: Diagnosis not present

## 2016-07-20 DIAGNOSIS — I129 Hypertensive chronic kidney disease with stage 1 through stage 4 chronic kidney disease, or unspecified chronic kidney disease: Secondary | ICD-10-CM | POA: Diagnosis not present

## 2016-07-20 DIAGNOSIS — I059 Rheumatic mitral valve disease, unspecified: Secondary | ICD-10-CM | POA: Diagnosis not present

## 2016-07-20 DIAGNOSIS — M1000 Idiopathic gout, unspecified site: Secondary | ICD-10-CM | POA: Diagnosis not present

## 2016-07-20 DIAGNOSIS — I4891 Unspecified atrial fibrillation: Secondary | ICD-10-CM

## 2016-07-20 LAB — POCT INR: INR: 2.6

## 2016-07-20 NOTE — Patient Instructions (Signed)
Pre visit review using our clinic review tool, if applicable. No additional management support is needed unless otherwise documented below in the visit note. 

## 2016-07-20 NOTE — Progress Notes (Signed)
I agree with this plan.

## 2016-07-23 ENCOUNTER — Other Ambulatory Visit: Payer: Self-pay | Admitting: Pharmacist

## 2016-07-23 NOTE — Patient Outreach (Signed)
[facility]) Care Management  07/23/2016  TELL ROZELLE 03-06-1924 [id number]  [place] received referral from [place] regarding concerns of patient safely managing medications.    Patient is receiving services from [place]---care coordination call to [place] who reports it would be best to complete an initial visit to patient as co-visit with her.  She reports patient is aware her next visit she may have a pharmacist with her.    Plan:  Co-visit scheduled to see patient with [facility] RN.   Karrie Meres, PharmD, [place] [phone]

## 2016-07-28 ENCOUNTER — Other Ambulatory Visit: Payer: Self-pay | Admitting: Pharmacist

## 2016-07-28 ENCOUNTER — Other Ambulatory Visit: Payer: Self-pay

## 2016-07-28 NOTE — Patient Outreach (Addendum)
[place] [facility]) Care Management  [place]   07/28/2016  [name] 27-May-1923 [id number]  Late entry for 07/28/16.    Subjective:  Patient was referred to [place] by [place], [place], for concerns with safely managing his medications, specifically warfarin.   Home visit completed 07/28/16 in conjunction with [place].  Patient is very hard of hearing.  He has a 31 day pill planner which was previously filled, he reports by home health.  Home health has apparently ended and patient is needing resources with getting pill box filled.   Patient reports he gets most of his medications from the [facility].  He reports he doesn't really know what medications he takes, just that he "takes them from the bottles or pill box."   He reports he has a daughter in [place] who checks on him, typically at least once a month and he reports she checks his pill box.    He reports when he takes his medications, he leaves that day open once empty so he knows what day to take next.     Objective:   Encounter Medications: Outpatient Encounter Prescriptions as of 07/28/2016  Medication Sig Note  . allopurinol (ZYLOPRIM) 100 MG tablet Take 100 mg by mouth daily. Take 2 tablets by mouth daily for gout prevention 07/28/2016: Patient taking 1 tablet daily.    Marland Kitchen amLODipine (NORVASC) 10 MG tablet Take 10 mg by mouth daily.    Marland Kitchen atenolol (TENORMIN) 25 MG tablet Take 25 mg by mouth daily.   . diclofenac sodium (VOLTAREN) 1 % GEL Apply 2 g topically 4 (four) times daily.   . digoxin (LANOXIN) 0.125 MG tablet Take 1 tablet (0.125 mg total) by mouth daily.   Marland Kitchen lisinopril (PRINIVIL,ZESTRIL) 40 MG tablet Take 40 mg by mouth daily.   . Omega-3 Fatty Acids (FISH OIL) 1000 MG CAPS Take 1,000 mg by mouth daily. 07/13/2016: Patient is taking 1200 mg daily   . Tamsulosin HCl (FLOMAX) 0.4 MG CAPS Take 0.4 mg by mouth daily.   Marland Kitchen warfarin (COUMADIN) 5 MG tablet Take as  directed by anticoagulation clinic.   . colchicine 0.6 MG tablet Take 1 tablet (0.6 mg total) by mouth 2 (two) times daily. (Patient not taking: Reported on 07/28/2016)   . [DISCONTINUED] quinapril (ACCUPRIL) 10 MG tablet Take 10 mg by mouth daily. 07/13/2016: Patient is taking lisinopril in the place of quinapril   No facility-administered encounter medications on file as of 07/28/2016.     Functional Status: In your present state of health, do you have any difficulty performing the following activities: 07/13/2016  Hearing? Y  Vision? N  Difficulty concentrating or making decisions? Y  Walking or climbing stairs? Y  Dressing or bathing? N  Doing errands, shopping? Y  Preparing Food and eating ? N  Using the Toilet? N  In the past six months, have you accidently leaked urine? N  Do you have problems with loss of bowel control? N  Managing your Medications? Y  Managing your Finances? N  Housekeeping or managing your Housekeeping? Y  Some recent data might be hidden    Fall/Depression Screening: Fall Risk  07/13/2016 07/25/2015  Falls in the past year? Yes No  Number falls in past yr: 1 -  Injury with Fall? No -  Risk for fall due to : History of fall(s);Impaired balance/gait;Impaired mobility -  Follow up Falls evaluation completed;Education provided;Falls prevention discussed -   PHQ 2/9 Scores  07/13/2016 07/25/2015  PHQ - 2 Score 0 0      Assessment:  Medications reviewed with patient's bottles in his home.  Drugs sorted by system:  Cardiovascular: -amlodipine 10 mg  -atenolol -digoxin  -lisinopril -warfarin   Endocrine: -allopurinol 100 mg tabs---directions noted as 2 tabs daily---patient only taking one tab daily -colchicine   Pain: -diclofenac gel  Vitamins/Minerals: -omega-3-fatty acid (OTC)   Miscellaneous: -tamsulosin  Drug interactions:  -allopurinol and warfarin---allopurinol can affect INR if allopurinol dose is changed---if adjustment made to  allopurinol recommend close monitoring of INR   Other issues noted:  -1) amlodipine---medication list in chart said 5 mg---prescription from [place] for amlodipine 10 mg and patient currently taking 10 mg daily -2) allopurinol---medication list and prescription bottle note 100 mg tabs, 2 tabs daily.  Patient currently taking 1 tablet once daily.  -3) Some days on his pill planner contained digoxin .25 mg instead of .125 mg as noted [facility]---the incorrect dose was removed and adjusted to reflect .125 mg daily of digoxin per medication list and prescription bottle from [place].    Warfarin--- Patient has 5 mg tablets.  His pill box was not filled correctly with warfarin dose.   Per chart review, most recent dose of warfarin was 5 mg tab---7.5 mg daily except 5 mg Tuesday and Saturday as of 07/20/16.    Patients pill box contained:   Wed 5/16:  5 mg Thurs 5/17: 5 mg Fri 5/18: 7.5 mg Sat 5/19:  5 mg Sun 5/20:  5 mg Mon 5/21:  7.5 mg Tues 5/22: 5 mg Wed 5/23: 5 mg Thurs 5/24:  7.5 mg Fri 5/25:  5 mg Sat 5/26:  5 mg Sun 5/27:  5 mg Mon 5/28:  7.5 mg Tues 5/29:  5 mg Wed 5/30:  7.5 mg Thurs 5/31:  7.5 mg Fri 6/1:  5 mg Sat 6/2:  7.5 mg   [facility]N RN had already placed call to Caren Griffins, RN with Dr Alona Bene office managing patient's warfarin as was told she was out of office until Wednesday.    Pill box management: Discussed [facility] [employer] with patient, which for a sliding scale fee based on income, could have a Therapist, sports fill patient's pill box.  Patient is not willing to pay for this service.   Blister packaging of medications was considered, however patient would need to switch from getting his maintenance medications from the [place] to a local pharmacy which may cause patient to incur additional prescription costs---he isn't interested in this.    Another consideration could be getting warfarin from a local pharmacy and blister  packaging----his warfarin management provider may need to provide specific directions on the prescription label to the dispensing pharmacy for it to be packaged.  Currently he most recently received warfarin from [place].    Medication adherence: His 31 day pill box has numbered day of month on it, not day of week.  He last took medications from 14th slot. He doesn't know why the 14th was last dose taken---it is certainly possible patient missed a dose.    He was counseled to take medications from slot numbered the 16th on Wednesday.    Plan:  Will place phone call to Elzie Rings, RN regarding patient's warfarin dose, as frankly there is no way of knowing what dose patient has been taking given the major discrepancy in his pill box.    Will route this note  to PCP as well.    Karrie Meres, PharmD, [place] [phone]

## 2016-07-28 NOTE — Patient Outreach (Signed)
[place] [facility]) Care Management  [place]  07/28/2016   [name] 01-26-1924 [id number]   Home co-visit completed with [name]ist.   Subjective: Patient stated that he has been feeling fine. Denies any complaints or concerns.   Objective:   Encounter Medications:  Outpatient Encounter Prescriptions as of 07/28/2016  Medication Sig Note  . allopurinol (ZYLOPRIM) 100 MG tablet Take 100 mg by mouth daily. Take 2 tablets by mouth daily for gout prevention   . amLODipine (NORVASC) 5 MG tablet Take 5 mg by mouth daily.   Marland Kitchen atenolol (TENORMIN) 25 MG tablet Take 25 mg by mouth daily.   . colchicine 0.6 MG tablet Take 1 tablet (0.6 mg total) by mouth 2 (two) times daily.   . diclofenac sodium (VOLTAREN) 1 % GEL Apply 2 g topically 4 (four) times daily.   . digoxin (LANOXIN) 0.125 MG tablet Take 1 tablet (0.125 mg total) by mouth daily.   Marland Kitchen lisinopril (PRINIVIL,ZESTRIL) 40 MG tablet Take 40 mg by mouth daily.   . Omega-3 Fatty Acids (FISH OIL) 1000 MG CAPS Take 1,000 mg by mouth daily. 07/13/2016: Patient is taking 1200 mg daily   . quinapril (ACCUPRIL) 10 MG tablet Take 10 mg by mouth daily. 07/13/2016: Patient is taking lisinopril in the place of quinapril  . Tamsulosin HCl (FLOMAX) 0.4 MG CAPS Take 0.4 mg by mouth daily.   Marland Kitchen warfarin (COUMADIN) 5 MG tablet Take as directed by anticoagulation clinic.    No facility-administered encounter medications on file as of 07/28/2016.     Functional Status:  In your present state of health, do you have any difficulty performing the following activities: 07/13/2016  Hearing? Y  Vision? N  Difficulty concentrating or making decisions? Y  Walking or climbing stairs? Y  Dressing or bathing? N  Doing errands, shopping? Y  Preparing Food and eating ? N  Using the Toilet? N  In the past six months, have you accidently leaked urine? N  Do you have problems with loss of bowel control? N  Managing your Medications? Y   Managing your Finances? N  Housekeeping or managing your Housekeeping? Y  Some recent data might be hidden    Fall/Depression Screening: Fall Risk  07/13/2016 07/25/2015  Falls in the past year? Yes No  Number falls in past yr: 1 -  Injury with Fall? No -  Risk for fall due to : History of fall(s);Impaired balance/gait;Impaired mobility -  Follow up Falls evaluation completed;Education provided;Falls prevention discussed -   PHQ 2/9 Scores 07/13/2016 07/25/2015  PHQ - 2 Score 0 0    Assessment:  Patient has no complaints today. He reported that he has been feeling fine. He denies any palpitations, weakness, chest pain or shortness of breath. Patient stated that he has been taking his medications daily. He stated that he is on "Day 14." Patient's pillbox is a 30 day planner, once a day with number days instead of Sunday - Saturday weekly. Patient should be on day 15 based on the date, indicating that he may have missed a day. Patient stated that he always leaves the box open for the date that he has taken his medication so he will remember that he took it. He did state that he took his medication today and left it open on the 14th. [facility] pharmacist is reviewing his pillbox. Patient has no complaints. RNCM provided education about signs and symptoms to report to his provider related to atrial fibrillation.  Patient stated that he has not had any symptoms for a few years. He is also on Coumadin therapy. Patient denies any bruising or bleeding. RNCM provided education on safety while using Coumadin and the discussed the increased risk of bruising and bleeding. Patient verbalized understanding. Outreach completed with Ruben Abide, RN with [facility] to determine goals of care. Per [name], he has closed his home health case. Patient has been stable with his INR x 1 month and is in need of an appointment next week to have his INR checked again. Patient stated that he will likely need a ride if it is soon. RNCM  provided this information to [name] as he is reviewing patient's pillbox. RNCM advised if patient has an appointment this week or early next week, will attempt to assist with transportation if his family is unable due to short notice. However, encouraged patient to talk with his daughters about taking him to this appointment.  Plan:  RNCM will continue to follow for possible care coordination of scheduling INR in coumadin clinic. RNCM will continue to monitor patient's a-fib and provide education. Will follow up in a couple of weeks (or sooner if needed).  Ruben R. Rumaldo Difatta, RN, BSN, [name] Management Coordinator [phone]

## 2016-07-30 ENCOUNTER — Other Ambulatory Visit: Payer: Self-pay | Admitting: Pharmacist

## 2016-07-30 ENCOUNTER — Telehealth: Payer: Self-pay | Admitting: General Practice

## 2016-07-30 NOTE — Telephone Encounter (Signed)
Left message with daughter, Siri Cole to call office about pt.

## 2016-07-30 NOTE — Patient Outreach (Signed)
[facility]) Care Management  07/30/2016  [name] June 26, 1923 [id number]  Late entry for 07/29/16.  Placed call to Dr Alona Bene office, and spoke with Caren Griffins, RN managing patient's warfarin.  Discussed with Caren Griffins concerns with patient's warfarin dose in pill box and requested they provide direction on dosage patient should take.  Warfarin management team requests patient take last recorded dose of 47.5 mg/week--- 7.5 mg daily, except 5 mg Tuesday and Saturday.    Discussed concerns with patient being able to fill pill box, and patient declining [place].  Can consider blister packaging of warfarin from local pharmacy but patient will need to be willing to do this, may have a co-pay for prescription and may have a charge for blister packaging.  His family may also need to provide transportation to get refill each month or whenever dose changes.    Cynthia requests [facility] Pharmacist attempt to contact patient to get medication box adjusted to reflect last documented warfarin dose in chart from 07/20/16.  She plans to contact patient's daughter.  Advised [facility] Pharmacist may not be able to reach out to patient until Thursday.   Discussed [facility] Pharmacist can temporarily fill pill box, but it is not a permanent solution and would be temporary.   Plan:  Will attempt outreach to patient.   Karrie Meres, PharmD, [place] [phone]

## 2016-07-30 NOTE — Patient Outreach (Signed)
[facility]) Care Management  07/30/2016  [name] 10/02/1923 [id number]  Attempted to reach patient multiple times today to schedule home visit to fix patient's warfarin dosage per warfarin management providers instructions.   1106 patient's phone rang but no option to leave a message.   Numerous attempts were made from 1600-1625 and line was busy during each attempt.   Plan:  Will update Meriam Sprague, RN, [facility] Pharmacist has been unable to reach patient.   Will try to reach patient again tomorrow.   Karrie Meres, PharmD, [place] [phone]

## 2016-07-31 ENCOUNTER — Other Ambulatory Visit: Payer: Self-pay | Admitting: Pharmacist

## 2016-07-31 ENCOUNTER — Telehealth: Payer: Self-pay | Admitting: General Practice

## 2016-07-31 NOTE — Patient Outreach (Signed)
[name] [facility]) Care Management  [place]   07/31/2016  [name] [birth year] [id number]  Subjective:  Acute home visit today with patient to review current warfarin dosing in patient's pill box per last warfarin clinic note in chart from 07/20/16.  Patient's daughter [name] was also present during home visit.    Patient was initially very frustrated being told his warfarin dose was filled different in his pill box than what anti-coagulation clinic had in patient's chart.  He was also frustrated warfarin dose changes at times.  Patient frustrated that home Burns previously checking his INR and filling his pill box and is no longer doing either of these for patient.  Patient also asked about medications he has seen advertised on TV in place of warfarin.  After his daughter left, he stated his other daughter [name], helps with his medications more and requested [name] call [name] to update her.  He stated he felt dumb for not getting dose right, and doesn't like to ask for help.    Objective:   Encounter Medications: Outpatient Encounter Prescriptions as of 07/31/2016  Medication Sig Note  . allopurinol (ZYLOPRIM) 100 MG tablet Take by mouth daily. Take 2 tablets by mouth daily for gout prevention 07/28/2016: Patient taking 1 tablet daily.    Marland Kitchen amLODipine (NORVASC) 10 MG tablet Take 10 mg by mouth daily.    Marland Kitchen atenolol (TENORMIN) 25 MG tablet Take 25 mg by mouth daily.   . colchicine 0.6 MG tablet Take 1 tablet (0.6 mg total) by mouth 2 (two) times daily. (Patient not taking: Reported on 07/28/2016)   . diclofenac sodium (VOLTAREN) 1 % GEL Apply 2 g topically 4 (four) times daily.   . digoxin (LANOXIN) 0.125 MG tablet Take 1 tablet (0.125 mg total) by mouth daily.   Marland Kitchen lisinopril (PRINIVIL,ZESTRIL) 40 MG tablet Take 40 mg by mouth daily.   . Omega-3 Fatty Acids (FISH OIL) 1000 MG CAPS Take 1,000 mg by mouth daily. 07/13/2016: Patient is taking 1200 mg daily   .  Tamsulosin HCl (FLOMAX) 0.4 MG CAPS Take 0.4 mg by mouth daily.   Marland Kitchen warfarin (COUMADIN) 5 MG tablet Take as directed by anticoagulation clinic.    No facility-administered encounter medications on file as of 07/31/2016.     Functional Status: In your present state of health, do you have any difficulty performing the following activities: 07/13/2016  Hearing? Y  Vision? N  Difficulty concentrating or making decisions? Y  Walking or climbing stairs? Y  Dressing or bathing? N  Doing errands, shopping? Y  Preparing Food and eating ? N  Using the Toilet? N  In the past six months, have you accidently leaked urine? N  Do you have problems with loss of bowel control? N  Managing your Medications? Y  Managing your Finances? N  Housekeeping or managing your Housekeeping? Y  Some recent data might be hidden    Fall/Depression Screening: Fall Risk  07/13/2016 07/25/2015  Falls in the past year? Yes No  Number falls in past yr: 1 -  Injury with Fall? No -  Risk for fall due to : History of fall(s);Impaired balance/gait;Impaired mobility -  Follow up Falls evaluation completed;Education provided;Falls prevention discussed -   PHQ 2/9 Scores 07/13/2016 07/25/2015  PHQ - 2 Score 0 0      Assessment:  Medication adherence: Warfarin dose in patient's pill box was adjusted for Sat 08/01/16-Fri 08/07/16.   His pill box currently has the following:  Sat 08/01/16---5 mg Sun 08/02/16---7.5 mg Mon 08/03/16---7.5 mg Tues 08/04/16---5 mg  Wed 08/05/16---7.5 mg Thurs 08/06/16---7.5 mg Fri 08/07/16---7.5 mg  Neither he nor his daughter wanted warfarin dosages taken out of the rest of his pill planner for after his INR check---warfarin dose in patient's pill box will need to be adjusted for 08/08/16 onward.  His daughter and he are aware of this.    He and his daughter were counseled as [facility] Burns is unsure what patient's warfarin dose will be following 08/07/16 INR check, unable to fill warfarin dose  past 08/07/16.    During home visit noticed that patient had a print out from anti-coagulation clinic dated 06/17/16 hanging by his medications.  It was an old warfarin dosage.  This may have contributed to patient's warfarin dosage discrepancy if someone (it is unknown who filled pill box after [facility], or when [place] had pill box filled through) went off of the previous warfarin directions.   Counseled patient and his daughter be sure to get the current dose directions from his provider following his INR check on 08/07/16.  Also counseled them to take his medication box and warfarin to his INR appointment with him.    Patient counseling: Counseled patient how warfarin dose is adjusted based on INR and individual factors such as diet (leafy greens) and medications can affect warfarin dose.  Counseled patient on importance of routine INR monitoring to ensure INR remains within therapeutic range.    Counseled patient he will need to discuss with his medical provider alternative options to warfarin as it would be up to his medical provider if one of these agents is appropriate for him.  Unsure which medication he saw advertised on TV but discussed with patient these medications are more expensive, he may have a deductible and co-pay may be at least $45-50/month.   [name] provided support to patient and said [name] did not think patient was dumb and understood why he was frustrated.  Explained [name] is here to help with this current transition in his care.    Care coordination: Placed call to [name], per his request at 1519, HIPAA details verified, and ~30 minute call with his daughter.   Updated [name] on what happened with warfarin dose, that pill planner has correct dose through 08/07/16 but warfarin dose will need to filled/adjusted following that.  Discussed with [name] options for pill box fills include [facility] [employer] and income based  fee with it.  Discussed blister packaging of medications and that patient would have medication co-pays along with potentially blister packaging/bubble packaging fees.    Daughter doesn't feel patient will want to change prescriptions from [place] or pay for these services.    Daughter reports she tries to see patient every 2-4 weeks [place] and that if warfarin dose is stable, she is willing/able to fill patient's pill box.   Plan:  Will route this note to Elzie Rings, RN with anti-coagulation clinic.   Daughter has [facility] Burns phone number and was advised she can call if she knows she will be in town Mon-Fri to set up time for [facility] to meet with her and patient.    Will plan to outreach patient/daughter week of 08/11/16 to follow-up on pill planner.    Karrie Meres, PharmD, [place] [phone]

## 2016-07-31 NOTE — Telephone Encounter (Signed)
I spoke to patient's daughter, Siri Cole and she is going to bring patient in to the coumadin clinic on Friday, 5/25 for and INR check.  She also stated that she will call the Pharmacist, Lennette Bihari with [facility] about scheduling a time for him to go out and re-fill patient's pill box.  I thanked Siri Cole for returning my call and she thanked me for reaching out to her.

## 2016-07-31 NOTE — Patient Outreach (Signed)
[facility]) Care Management  07/31/2016  [name] [birth year] [id number]  Received a voice message from patient's daughter requesting return call to set-up a time to review patient's pill box for his warfarin dosage, which per home visit earlier this week, is filled incorrectly. Patient's daughter was given [facility] Pharmacist phone number by Caren Griffins, RN with anti-coagulation clinic at [facility].    Phone call to Elzie Rings, RN to update her [facility] Pharmacist can address warfarin dosage in pill planner for patient this afternoon.  Noted INR check is scheduled for next Friday afternoon, discussed with RN that patient's warfarin box will reflect last documented dose from 07/20/16 of 7.5 mg warfarin daily, except 5 mg Tuesday and Saturday.    Plan:    Acute home visit scheduled for this afternoon.    Karrie Meres, PharmD, [place] [phone]

## 2016-08-07 ENCOUNTER — Ambulatory Visit (INDEPENDENT_AMBULATORY_CARE_PROVIDER_SITE_OTHER): Payer: Medicare HMO | Admitting: General Practice

## 2016-08-07 DIAGNOSIS — I4891 Unspecified atrial fibrillation: Secondary | ICD-10-CM

## 2016-08-07 DIAGNOSIS — Z5181 Encounter for therapeutic drug level monitoring: Secondary | ICD-10-CM

## 2016-08-07 LAB — POCT INR: INR: 4.3

## 2016-08-07 NOTE — Patient Instructions (Addendum)
Pre visit review using our clinic review tool, if applicable. No additional management support is needed unless otherwise documented below in the visit  Today Patient's INR was high at 4.7.  Patient's daughter brought pill box and it was not filled correctly.  Today I gave patient a calendar with dosage for each day.  Daughter, [name] said that she would fill pill box accordingly.  She does say that patient moves pills around in the box sometimes.  [name] is going to check with the [place] and see if patient can get Xarelto or Eliquis and I am going to check with Dr. Ronnald Ramp and find out if this might be an option for patient.  Today I asked patient to skip 2 days worth of warfarin and resume taking it on Sunday (he already took today's dose).  Patient and daughter verbalize understanding of instructions.

## 2016-08-13 ENCOUNTER — Other Ambulatory Visit: Payer: Self-pay | Admitting: Pharmacist

## 2016-08-13 NOTE — Patient Outreach (Signed)
[facility]) Care Management  08/13/2016  [name] [birth year] [id number]  Follow-up call to patient.  Patient answered and was able to verify his name, he did not verify DOB/address.  Patient states he does not remember [facility] Pharmacist.  When asked if his daughter, [name], filled his warfarin in his pill box he reports, "I have no idea, you need to call her and ask her."  Patient sounded agitated on the phone and disconnected the call.    Attempted to contact, daughter [name], who appears to have taken patient to his INR check appointment.  No answer, HIPAA compliant message left requesting return call.   Plan:  Will make another follow-up call to patient's daughter [name], or Peter Congo next week, if no return call as patient instructed [facility] Pharmacist call his daughter.   Karrie Meres, PharmD, [place] [phone]

## 2016-08-17 ENCOUNTER — Other Ambulatory Visit: Payer: Self-pay

## 2016-08-17 NOTE — Patient Outreach (Signed)
[place] [facility]) Care Management  08/17/16  [name] September 18, 1923 [id number]  Successful outreach completed with patient. Patient was very hard of hearing and RNCM had to shout into the phone. However, patient was able to verbalize that he knew who RNCM was and was agreeable to a home visit next week. He stated he was doing ok right now and has no needs.  Plan: RNCM will perform home visit next week and close case.  [place] R. Avni Traore, RN, BSN, [place] Management Coordinator [phone]

## 2016-08-19 ENCOUNTER — Other Ambulatory Visit: Payer: Self-pay | Admitting: Pharmacist

## 2016-08-19 NOTE — Patient Outreach (Signed)
[place] [facility]) Care Management  08/19/2016  [name] 01-Dec-1923 [id number]  Unsuccessful phone outreach to patient's daughter, [name], HIPAA compliant message left requesting return call.   Last week, patient had requested [facility] Pharmacist contact his daughter to see if she knew how his pill box was set up then he disconnected call.  Have discussed with patient and his daughter options for pill box filling at previous home visit/phone calls and patient had not been interested in those options.    This was second unsuccessful attempt to reach his daughter, [name].    Plan:  Will make another outreach attempt to his daughter in the next week.    Karrie Meres, PharmD, [place] [phone]

## 2016-08-25 ENCOUNTER — Other Ambulatory Visit: Payer: Self-pay

## 2016-08-27 ENCOUNTER — Ambulatory Visit (INDEPENDENT_AMBULATORY_CARE_PROVIDER_SITE_OTHER): Payer: Medicare HMO | Admitting: General Practice

## 2016-08-27 DIAGNOSIS — Z5181 Encounter for therapeutic drug level monitoring: Secondary | ICD-10-CM | POA: Diagnosis not present

## 2016-08-27 DIAGNOSIS — I4891 Unspecified atrial fibrillation: Secondary | ICD-10-CM

## 2016-08-27 LAB — POCT INR: INR: 1.3

## 2016-08-27 NOTE — Patient Instructions (Signed)
Pre visit review using our clinic review tool, if applicable. No additional management support is needed unless otherwise documented below in the visit note. 

## 2016-08-28 ENCOUNTER — Ambulatory Visit: Payer: Medicare HMO

## 2016-09-03 NOTE — Patient Outreach (Signed)
[facility]) Care Management   09/03/2016  Late entry for 08/25/2016  [name] September 17, 1923 [id number]  Home visit scheduled with patient.   [name] is an 83 y.o. male  Subjective:  Patient stated he was doing well and has no complaints today. He stated that he thought "the nurse" was supposed to come and fill his pillbox."   Objective:   ROS  Physical Exam  Encounter Medications:   Outpatient Encounter Prescriptions as of 08/25/2016  Medication Sig Note  . allopurinol (ZYLOPRIM) 100 MG tablet Take by mouth daily. Take 2 tablets by mouth daily for gout prevention 07/28/2016: Patient taking 1 tablet daily.    Marland Kitchen amLODipine (NORVASC) 10 MG tablet Take 10 mg by mouth daily.    Marland Kitchen atenolol (TENORMIN) 25 MG tablet Take 25 mg by mouth daily.   . colchicine 0.6 MG tablet Take 1 tablet (0.6 mg total) by mouth 2 (two) times daily. (Patient not taking: Reported on 07/28/2016)   . diclofenac sodium (VOLTAREN) 1 % GEL Apply 2 g topically 4 (four) times daily.   . digoxin (LANOXIN) 0.125 MG tablet Take 1 tablet (0.125 mg total) by mouth daily.   Marland Kitchen lisinopril (PRINIVIL,ZESTRIL) 40 MG tablet Take 40 mg by mouth daily.   . Omega-3 Fatty Acids (FISH OIL) 1000 MG CAPS Take 1,000 mg by mouth daily. 07/13/2016: Patient is taking 1200 mg daily   . Tamsulosin HCl (FLOMAX) 0.4 MG CAPS Take 0.4 mg by mouth daily.   Marland Kitchen warfarin (COUMADIN) 5 MG tablet Take as directed by anticoagulation clinic.    No facility-administered encounter medications on file as of 08/25/2016.     Functional Status:   In your present state of health, do you have any difficulty performing the following activities: 07/13/2016  Hearing? Y  Vision? N  Difficulty concentrating or making decisions? Y  Walking or climbing stairs? Y  Dressing or bathing? N  Doing errands, shopping? Y  Preparing Food and eating ? N  Using the Toilet? N  In the past six months, have you accidently leaked urine? N  Do you have  problems with loss of bowel control? N  Managing your Medications? Y  Managing your Finances? N  Housekeeping or managing your Housekeeping? Y  Some recent data might be hidden    Fall/Depression Screening:    Fall Risk  07/13/2016 07/25/2015  Falls in the past year? Yes No  Number falls in past yr: 1 -  Injury with Fall? No -  Risk for fall due to : History of fall(s);Impaired balance/gait;Impaired mobility -  Follow up Falls evaluation completed;Education provided;Falls prevention discussed -   PHQ 2/9 Scores 07/13/2016 07/25/2015  PHQ - 2 Score 0 0    Assessment:   Upon arrival, patient was out in his laundry room (that is outside the home off of his garage). He was ambulating without his walker, taking small, shuffling steps. No balance issues noted, but patient does move very slowly in order to ambulate safely. Patient noted using the wall to help keep him steady when stepping up the 2 steps to enter his home.   Upon review of patient's pillbox, it had not been filled for the month of June. However, there were some doses of pills remaining for the first week and a few scattered days in the pillbox. Patient has a 30-day pillbox labeled by numerical dates rather than a Sunday to Saturday pillbox. Patient stated that it was not a big deal because  he had continued to take his medications out of the pill bottles. He stated that he was reading the directions on the bottles of the medications except for coumadin, and for that he was following written instructions provided by his doctor's office the last time he was there.  RNCM spent much of the visit checking the remaining days and updating those with correct pills and filling in the remaining days. RNCM assessed patient's last INR visit with coumadin clinic to verify correct dose of coumadin and ensured it had been placed in the pillbox appropriately.  RNCM educated patient that his home health nurse, Dorice Lamas was no longer coming out to fill his  box and that Lennette Bihari, the pharmacist from [facility] had attempted to reach him, but was unable to schedule a home visit that day. Reminded patient of Kevin's discussions with him and his family about options for filling his pillbox, such as mail order, pre-packaged through the pharmacy and contacting the [place] for other options. Patient stated that he guessed he remembered that but had forgotten. RNCM reminded patient that we will need to work with him and his family to find a solution so that his pillbox will be filled and he can take his medications. Patient stated that he was confident that he could fill his pillbox himself, stating that all he has to do is take one out of each bottle and put in the pillbox for each day. RNCM reminded patient that currently, he is to take 1 1/2 tablets of his coumadin all days except 1 tablet on Tues and Saturday and that his pillbox could be confusing because it does not show the days of the week. Patient stated that the last time he was in the coumadin clinic that he was given a calendar that showed when he was supposed to take his coumadin and that is how he has been taking it. Patient was agreeable to have RNCM reach out to his daughters to discuss.  Patient now has pillbox filled through September 23, 2016 as RNCM did ensure that dates were re-filled in first 2 weeks for next month.  Patient has no other complaints or concerns this visit. He has not had any palpitations, any bruising or  Bleeding and denies any falls. He stated that he has been doing good. He reported that he does have a follow up appointment this Friday to have his blood checked again and that his daughter, [name] is taking him to this appointment. He denies any other needs.  **During visit, RNCM attempted to reach his daughter, [name] without success. Left HIPAA compliant voicemail with RNCM contact information and requested call back.  Plan: RNCM will await callback from patient's daughter to discuss plans for  patient's pillbox as this is main remaining issue for patient. RNCM will update [facility] pharmacist of today's visit. RNCM encouraged patient to call if he has any questions or concerns. Advised of RNCM plans to contact his daughter and [facility] pharmacist of goals of coming up with a long term plan for his medications/pillbox and patient was agreeable. Advised will follow up with him within next 3-4 weeks to discuss plan and outcomes of work with daughter and [facility] pharmacist and patient was agreeable.  [place] R. Ameriah Lint, RN, BSN, [place] Management Coordinator [phone]

## 2016-09-04 ENCOUNTER — Other Ambulatory Visit: Payer: Self-pay | Admitting: Pharmacist

## 2016-09-04 NOTE — Patient Outreach (Addendum)
[place] [facility]) Care Management  09/04/2016  ANDREWJAMES WEIRAUCH [birth year] [id number]  [facility] Pharmacist made third phone outreach to patient's daughter, per previous request from patient.    Patient's daughter, [name], has not yet returned calls---third outreach attempt to patient's daughter Frieda---no answer, HIPAA compliant message.  Unsuccessful phone outreach to patient's other daughter, Peter Congo, no voicemail set-up.   Unsuccessful outreach to patient, no voicemail set-up.   Plan:  Will make outreach attempt next week to patient.    Karrie Meres, PharmD, [place] [phone]  Addendum  [phone]:    Received a call back from patient's daughter, Peter Congo.  Peter Congo reports patient has been with her the past week.  Patient has previously provided consent to speak with his daughter.    Peter Congo reports patient has had increased diarrhea and wonders if medications could cause it---discussed diarrhea is a listed side effect of allopurinol or colchicine, though these medications are not new to patient---she reports patient's diarrhea has been ongoing---she was counseled patient should seek attention from a medical provider.   Peter Congo reports she feels she can fill patient's pill box at least once monthly.  She reports in the past she has tried 4-weekly pill boxes with patient and he didn't like it, so patient prefers the current 30 day pill box he has.    Discussed with Peter Congo patient has in the past denied interest in using resources [facility] Pharmacist offered him for medication management.  Discussed [facility] is no longer available.  Patient has declined wanting to have medications blister packaged by a local pharmacy vs his current [place] and mail order pharmacies.   Peter Congo reports patient has declined assistance from her previously when she offered to pay for someone to help him.  She reports patient has been  taking his medications the past week.   She reports while she will be able to fill his pill box, her concern is someone going to his home to make sure he took medications.  She reports she tries to call him to remind him to take medications.   Discussed with Peter Congo family would likely need to work with patient and each other to have someone see if he took his medications each day or he could look into private pay resources, which [facility] LCSW could assist with providing him options.  She declines this stating patient has declined this assistance from her.   Recommend they [facility] if [facility]---she reports she will follow-up with [facility] and family/family friends.    At this time, daughter reports she will fill pill box for patient and plans to call [facility] Pharmacist back in about 2 weeks.   Plan:  Will follow-up with patient/daughter in the next 2 weeks.   Updated [facility] RN about patient.   Will route note to PCP to make him aware.    Karrie Meres, PharmD, [place] [phone]

## 2016-09-08 ENCOUNTER — Ambulatory Visit: Payer: Medicare HMO | Admitting: Pharmacist

## 2016-09-09 ENCOUNTER — Ambulatory Visit (INDEPENDENT_AMBULATORY_CARE_PROVIDER_SITE_OTHER): Payer: Medicare HMO | Admitting: General Practice

## 2016-09-09 DIAGNOSIS — I4891 Unspecified atrial fibrillation: Secondary | ICD-10-CM

## 2016-09-09 DIAGNOSIS — Z5181 Encounter for therapeutic drug level monitoring: Secondary | ICD-10-CM

## 2016-09-09 LAB — POCT INR: INR: 2

## 2016-09-09 NOTE — Progress Notes (Signed)
I have reviewed and agree with the plan. 

## 2016-09-09 NOTE — Patient Instructions (Signed)
Pre visit review using our clinic review tool, if applicable. No additional management support is needed unless otherwise documented below in the visit note. 

## 2016-09-10 ENCOUNTER — Other Ambulatory Visit: Payer: Self-pay | Admitting: Internal Medicine

## 2016-09-10 DIAGNOSIS — I480 Paroxysmal atrial fibrillation: Secondary | ICD-10-CM

## 2016-09-15 ENCOUNTER — Other Ambulatory Visit: Payer: Self-pay | Admitting: Internal Medicine

## 2016-09-15 ENCOUNTER — Telehealth: Payer: Self-pay | Admitting: Internal Medicine

## 2016-09-15 NOTE — Telephone Encounter (Signed)
Daughter [name] called in and said that pt is confused and she is concerned.  Possible [facility], can a urine order just be put in for him to just come to the lab today    [place]

## 2016-09-16 NOTE — Telephone Encounter (Signed)
No, I am not comfortable with this request

## 2016-09-17 ENCOUNTER — Ambulatory Visit: Payer: Self-pay | Admitting: Pharmacist

## 2016-09-17 ENCOUNTER — Other Ambulatory Visit: Payer: Self-pay

## 2016-09-17 NOTE — Telephone Encounter (Signed)
Called mobile number but no voicemail to leave a message.   Left message on the home number of the Emergency Contact. Pt does not hear well and did not call him directly.   RE: Pt will need to be seen to test and treat the possible UTI.

## 2016-09-17 NOTE — Patient Outreach (Signed)
[facility]) Care Management  09/17/16  MARTY UY 1923/11/25 [id number]  Second attempt to reach patient's daughter [name] for follow up and long term plan of care without success. (Initial attempt was during home visit with patient on 08/25/2016). Left HIPAA compliant voicemail with RNCM contact information and invited callback.   Plan: RNCM will make a final attempt to reach patient's daughter and if no contact, will perform a case closure as patient has no further nursing case management needs.  [place] R. Drey Shaff, RN, BSN, [place] Management Coordinator [phone]

## 2016-09-18 ENCOUNTER — Encounter: Payer: Self-pay | Admitting: Pharmacist

## 2016-09-18 ENCOUNTER — Other Ambulatory Visit: Payer: Self-pay

## 2016-09-18 ENCOUNTER — Other Ambulatory Visit: Payer: Self-pay | Admitting: Pharmacist

## 2016-09-18 NOTE — Patient Outreach (Signed)
[facility]) Care Management  09/18/16  [name] 04/18/1923 [id number]  Third and final outreach attempt to patient's daughter, [name] completed without success. Left HIPAA compliant voicemail with RNCM contact information and requested callback.   Patient has no other nursing case management needs and unable to make successful contact with patient's daughter. [facility] pharmacist has been working with patient/family on long term plans for filling pillbox.  Plan- RNCM to send case closure letter to patient since he is hard of hearing on the phone. RNCM to notify pharmacist of case closure.  [place] R. Javayah Magaw, RN, BSN, [place] Management Coordinator [phone]

## 2016-09-18 NOTE — Patient Outreach (Signed)
[facility]) Care Management  09/18/2016  KURON DOCKEN [birth year] [id number]  Unsuccessful phone outreach to patient---line was busy.   Successful phone outreach to patient's daughter, Peter Congo, whom patient has requested [facility] Pharmacist speak with in the past.   Peter Congo reports patient's pill box is filled through 10/06/16 and that she plans to keep his pill box filled as best as she can.  She reports she has a family friend checking on patient to help make sure he takes his medications.  She is concerned mostly about warfarin.    Have in the past discussed options for blister packaging of medications with patient and he has declined this.   He is very hard of hearing.    Peter Congo understands that patient has declined options [facility] Pharmacist has provided patient.  She states patient has declined other resources/ideas she has offered him.   Dicussed with Peter Congo, patient could check into any resources offered by the [place], if patient is interested.    Discussed if patient/Gloria are looking for personal care services, patient would need to explore pay resources which [facility] LCSW may be able to provide information on, but so far patient's daughter reports patient has declined this.   Peter Congo understands if patient is interested in assistance/medication blister packaging at a later date [facility] can help him locate resources.   Plan:   Close pharmacy case at this time.   Will notify PCP of pharmacy case closure.   Karrie Meres, PharmD, [place] [phone]

## 2016-10-02 ENCOUNTER — Ambulatory Visit: Payer: Medicare HMO

## 2016-10-02 ENCOUNTER — Ambulatory Visit (INDEPENDENT_AMBULATORY_CARE_PROVIDER_SITE_OTHER): Payer: Medicare HMO | Admitting: General Practice

## 2016-10-02 DIAGNOSIS — Z5181 Encounter for therapeutic drug level monitoring: Secondary | ICD-10-CM

## 2016-10-02 DIAGNOSIS — I4891 Unspecified atrial fibrillation: Secondary | ICD-10-CM

## 2016-10-02 LAB — POCT INR: INR: 1.4

## 2016-10-13 NOTE — Progress Notes (Signed)
This encounter was created in error - please disregard.

## 2016-10-16 ENCOUNTER — Ambulatory Visit (INDEPENDENT_AMBULATORY_CARE_PROVIDER_SITE_OTHER): Payer: Medicare HMO | Admitting: General Practice

## 2016-10-16 DIAGNOSIS — Z5181 Encounter for therapeutic drug level monitoring: Secondary | ICD-10-CM

## 2016-10-16 DIAGNOSIS — I4891 Unspecified atrial fibrillation: Secondary | ICD-10-CM

## 2016-10-16 LAB — POCT INR: INR: 1.9

## 2016-10-16 NOTE — Patient Instructions (Signed)
Pre visit review using our clinic review tool, if applicable. No additional management support is needed unless otherwise documented below in the visit note. 

## 2016-10-16 NOTE — Progress Notes (Signed)
I have reviewed and agree with the plan. 

## 2016-11-13 ENCOUNTER — Ambulatory Visit (INDEPENDENT_AMBULATORY_CARE_PROVIDER_SITE_OTHER): Payer: Medicare HMO | Admitting: General Practice

## 2016-11-13 DIAGNOSIS — Z5181 Encounter for therapeutic drug level monitoring: Secondary | ICD-10-CM

## 2016-11-13 DIAGNOSIS — I4891 Unspecified atrial fibrillation: Secondary | ICD-10-CM | POA: Diagnosis not present

## 2016-11-13 LAB — POCT INR: INR: 3.3

## 2016-11-13 NOTE — Patient Instructions (Signed)
Pre visit review using our clinic review tool, if applicable. No additional management support is needed unless otherwise documented below in the visit note. 

## 2016-11-17 ENCOUNTER — Other Ambulatory Visit: Payer: Self-pay | Admitting: Internal Medicine

## 2016-11-17 DIAGNOSIS — I480 Paroxysmal atrial fibrillation: Secondary | ICD-10-CM

## 2016-11-18 ENCOUNTER — Other Ambulatory Visit: Payer: Self-pay | Admitting: General Practice

## 2016-11-18 DIAGNOSIS — I480 Paroxysmal atrial fibrillation: Secondary | ICD-10-CM

## 2016-11-18 MED ORDER — WARFARIN SODIUM 5 MG PO TABS: ORAL_TABLET | ORAL | 0 refills | Status: DC

## 2016-11-18 NOTE — Telephone Encounter (Signed)
Will you review the rx rq to refill the warfarin?

## 2016-12-04 ENCOUNTER — Other Ambulatory Visit: Payer: Self-pay | Admitting: Pharmacist

## 2016-12-04 NOTE — Patient Outreach (Signed)
[facility]) Care Management  12/04/2016  RHYLEN SHAHEEN 04-19-1923 [id number]  Received an in-basket message from [place], patient's daughter had concerns with medication management.    Patient known to [facility] Pharmacist from previous pharmacy episode.    Successful phone outreach to patient's daughter, Peter Congo, HIPAA details verified.    Peter Congo reports there are no medication management concerns at this time.  She reports she is filling patient's pill planner about every 2 weeks and has a family friend checking on patient daily to make sure he took his medications and has groceries.    She reports patient has followed up with his provider at the [facility] clinic as well.    She was appreciative of call but denies pharmacy needs/concerns at this time.   Plan:  Will not open a pharmacy episode as patient's daughter denies needs for patient at this time related to pharmacy.    Karrie Meres, PharmD, [place] [phone]

## 2016-12-11 ENCOUNTER — Ambulatory Visit (INDEPENDENT_AMBULATORY_CARE_PROVIDER_SITE_OTHER): Payer: Medicare HMO | Admitting: General Practice

## 2016-12-11 DIAGNOSIS — I4891 Unspecified atrial fibrillation: Secondary | ICD-10-CM | POA: Diagnosis not present

## 2016-12-11 DIAGNOSIS — Z5181 Encounter for therapeutic drug level monitoring: Secondary | ICD-10-CM | POA: Diagnosis not present

## 2016-12-11 DIAGNOSIS — Z7901 Long term (current) use of anticoagulants: Secondary | ICD-10-CM | POA: Diagnosis not present

## 2016-12-11 LAB — POCT INR: INR: 1.6

## 2016-12-11 NOTE — Patient Instructions (Signed)
Pre visit review using our clinic review tool, if applicable. No additional management support is needed unless otherwise documented below in the visit note. 

## 2016-12-11 NOTE — Progress Notes (Signed)
I have reviewed and agree with the plan. 

## 2016-12-16 ENCOUNTER — Ambulatory Visit (INDEPENDENT_AMBULATORY_CARE_PROVIDER_SITE_OTHER)
Admission: RE | Admit: 2016-12-16 | Discharge: 2016-12-16 | Disposition: A | Discharge status: Home / Self Care | Payer: Medicare HMO | Source: Ambulatory Visit | Attending: Family Medicine | Admitting: Family Medicine

## 2016-12-16 ENCOUNTER — Ambulatory Visit (INDEPENDENT_AMBULATORY_CARE_PROVIDER_SITE_OTHER): Payer: Medicare HMO | Admitting: Family Medicine

## 2016-12-16 ENCOUNTER — Encounter: Payer: Self-pay | Admitting: Family Medicine

## 2016-12-16 VITALS — BP 142/64 | HR 65 | Temp 97.6°F | Resp 16 | Ht 71.0 in | Wt 173.0 lb

## 2016-12-16 DIAGNOSIS — R079 Chest pain, unspecified: Secondary | ICD-10-CM

## 2016-12-16 NOTE — Progress Notes (Signed)
[name] - 83 y.o. male MRN [id number]  Date of birth: [birth year]  SUBJECTIVE:  Including CC & ROS.  No chief complaint on file.   [name] is a 83 year old male that is presenting with right-sided chest pain. This is intermittent in nature. He does not have any specific trigger. It is sharp. He denies any nausea or vomiting associated with it. He denies any previous pain. The pain started about 2 weeks ago. He has not taken any medications for this pain. He has no left-sided chest pain. He has not had any recent illnesses. He had some cough last night. Denies any recent illnesses.   Review of his labs from October 2017 show an elevated creatinine at 1.68. A CT Angela chest from 2017 shows mild to moderate emphysema. This does not show any aneurysm or dissection.  Review of Systems  Constitutional: Negative for fever.  Respiratory: Positive for cough. Negative for shortness of breath.   Cardiovascular: Positive for chest pain.    HISTORY: Past Medical, Surgical, Social, and Family History Reviewed & Updated per EMR.   Pertinent Historical Findings include:  Past Medical History:  Diagnosis Date  . Arthritis    "right wrist and hand" (02/25/2012)  . Atrial fibrillation ([place])   . Carotid artery occlusion   . Diverticula of colon   . Enlarged prostate   . Hypertension   . Knee pain, right    "twisted it couple days ago; pain getting worse since" (02/25/2012)  . Skin cancer of face     Past Surgical History:  Procedure Laterality Date  . CAROTID ENDARTERECTOMY  12/02/2009   RIGHT  cea  . COLON RESECTION  ~ 2010  . INGUINAL HERNIA REPAIR  ~ 2011   "left" (02/25/2012)  . JOINT REPLACEMENT Right   . SKIN CANCER EXCISION     "q once in awhile; on my face" (02/25/2012)  . TOTAL HIP ARTHROPLASTY  1980's   "right" (02/25/2012)    No Known Allergies  Family History  Problem Relation Age of Onset  . Heart disease Father        Heart Disease before age 37  . Heart attack  Father   . Healthy Mother   . Hypertension Brother   . Heart attack Brother   . Hypertension Brother   . Stroke Brother   . Heart disease Brother   . Hypertension Brother   . Stroke Brother   . Heart disease Brother   . Cancer Sister      Social History   Social History  . Marital status: Widowed    Spouse name: N/A  . Number of children: N/A  . Years of education: N/A   Occupational History  . Not on file.   Social History Main Topics  . Smoking status: Former Smoker    Packs/day: 1.00    Years: 33.00    Types: Cigarettes    Quit date: 03/16/1978  . Smokeless tobacco: Never Used     Comment: 02/25/2012 "quit smoking in 1980"  . Alcohol use No  . Drug use: No  . Sexual activity: Not Currently   Other Topics Concern  . Not on file   Social History Narrative  . No narrative on file     PHYSICAL EXAM:  VS: BP (!) 142/64 (BP Location: Right Arm, Patient Position: Sitting, Cuff Size: Normal)   Pulse 65   Temp 97.6 F (36.4 C) (Oral)   Resp 16   Ht 5\' 11"  (1.803 m)  Wt 173 lb (78.5 kg)   SpO2 95%   BMI 24.13 kg/m  Physical Exam Gen: NAD, alert, cooperative with exam, well-appearing ENT: normal lips, normal nasal mucosa,  Eye: normal EOM, normal conjunctiva and lids CV:  no edema, +2 pedal pulses, S1-S2  Resp: no accessory muscle use, non-labored, clear to auscultation bilaterally, no crackles or wheezes Skin: no rashes, no areas of induration  Neuro: normal tone, normal sensation to touch Psych:  normal insight, alert and oriented MSK:  Chest: No significant tenderness to palpation of the costal and his right chest. No tests palpation over the acromioclavicular joint or the sternoclavicular joint. Normal shoulder range of motion. No pain with resistance of the upper extremity. Normal strength to resistance. Neurovascular intact      ASSESSMENT & PLAN:   Right-sided chest pain Pain could be associated with costochondritis. Does not appear to be  infectious currently. Unclear if this associated with his underlying pleura. Not associated with any trauma to suggest fracture or repetitive coughing to suggest stress fracture. He is on chronic anticoagulation with warfarin. - Chest x-rays - Can apply Voltaren gel that he already has - If no improvement may need to consider course of steroids

## 2016-12-16 NOTE — Assessment & Plan Note (Signed)
Pain could be associated with costochondritis. Does not appear to be infectious currently. Unclear if this associated with his underlying pleura. Not associated with any trauma to suggest fracture or repetitive coughing to suggest stress fracture. He is on chronic anticoagulation with warfarin. - Chest x-rays - Can apply Voltaren gel that he already has - If no improvement may need to consider course of steroids

## 2016-12-16 NOTE — Patient Instructions (Signed)
Thank you for coming in,   We will call you with the results from today.   Please try to use the voltaren gel   Please give [place] a call back if there is no improvement.    Please feel free to call with any questions or concerns at any time, at [phone]. --Dr. Raeford Razor  Costochondritis Costochondritis is swelling and irritation (inflammation) of the tissue (cartilage) that connects your ribs to your breastbone (sternum). This causes pain in the front of your chest. Usually, the pain:  Starts gradually.  Is in more than one rib.  This condition usually goes away on its own over time. Follow these instructions at home:  Do not do anything that makes your pain worse.  If directed, put ice on the painful area: ? Put ice in a plastic bag. ? Place a towel between your skin and the bag. ? Leave the ice on for 20 minutes, 2-3 times a day.  If directed, put heat on the affected area as often as told by your doctor. Use the heat source that your doctor tells you to use, such as a moist heat pack or a heating pad. ? Place a towel between your skin and the heat source. ? Leave the heat on for 20-30 minutes. ? Take off the heat if your skin turns bright red. This is very important if you cannot feel pain, heat, or cold. You may have a greater risk of getting burned.  Take over-the-counter and prescription medicines only as told by your doctor.  Return to your normal activities as told by your doctor. Ask your doctor what activities are safe for you.  Keep all follow-up visits as told by your doctor. This is important. Contact a doctor if:  You have chills or a fever.  Your pain does not go away or it gets worse.  You have a cough that does not go away. Get help right away if:  You are short of breath. This information is not intended to replace advice given to you by your health care provider. Make sure you discuss any questions you have with your health care provider. Document  Released: 08/19/2007 Document Revised: 09/20/2015 Document Reviewed: 06/26/2015 Elsevier Interactive Patient Education  [employer].

## 2016-12-17 ENCOUNTER — Telehealth: Payer: Self-pay | Admitting: Family Medicine

## 2016-12-17 NOTE — Telephone Encounter (Signed)
Unable to leave VM for patient. If patient's daugher calls back please have her speak with a nurse/CMA and inform that her father's chest xray was normal. She can call us back if he doesn't have any improvement and we can consider sending in steroids.   If any questions then please take the best time and phone number to call and I will try to call her back.   Rosemarie Ax, MD [facility] 12/17/2016, 8:56 AM

## 2016-12-17 NOTE — Telephone Encounter (Signed)
Spoke with pt's daughter and gave her MD response. She expressed understanding and did not have any questions at this time.

## 2016-12-25 ENCOUNTER — Telehealth: Payer: Self-pay | Admitting: Internal Medicine

## 2016-12-25 ENCOUNTER — Other Ambulatory Visit: Payer: Self-pay

## 2016-12-25 MED ORDER — PREDNISONE 20 MG PO TABS: 40.0000 mg | ORAL_TABLET | Freq: Every day | ORAL | 0 refills | Status: DC

## 2016-12-25 NOTE — Telephone Encounter (Signed)
[name], Can you call Dr. Raeford Razor and ask what he was okay with rx'ing?

## 2016-12-25 NOTE — Telephone Encounter (Signed)
Spoke with Daughter and she is aware medication was sent to [place]. She said thank you very much!

## 2016-12-25 NOTE — Telephone Encounter (Signed)
Daughter called to advise pt is not better and saw Dr Raeford Razor for the right side chest pain on 10/03. She states pt was told could try a course of steroids if did not get any better.  Pt would like to proceed with this plan asap.   [place], [place] office phones are down and daughter contacted the [place] office for help.

## 2016-12-25 NOTE — Telephone Encounter (Signed)
Sent in prednisone, take 2 pills daily (same time) for 5 days for the pain.

## 2016-12-25 NOTE — Telephone Encounter (Signed)
Tried to call patients daughter back to let her know the medication was sent in and it didn't go through. I realized [facility] sent rx to [facility] so I re-sent it to [place]

## 2017-01-08 ENCOUNTER — Ambulatory Visit (INDEPENDENT_AMBULATORY_CARE_PROVIDER_SITE_OTHER): Payer: Medicare HMO | Admitting: General Practice

## 2017-01-08 ENCOUNTER — Encounter: Payer: Self-pay | Admitting: Family Medicine

## 2017-01-08 ENCOUNTER — Ambulatory Visit (INDEPENDENT_AMBULATORY_CARE_PROVIDER_SITE_OTHER): Payer: Medicare HMO | Admitting: Family Medicine

## 2017-01-08 ENCOUNTER — Other Ambulatory Visit (INDEPENDENT_AMBULATORY_CARE_PROVIDER_SITE_OTHER): Payer: Medicare HMO

## 2017-01-08 VITALS — BP 122/52 | HR 75 | Temp 97.5°F | Wt 163.0 lb

## 2017-01-08 DIAGNOSIS — Z7901 Long term (current) use of anticoagulants: Secondary | ICD-10-CM

## 2017-01-08 DIAGNOSIS — I4891 Unspecified atrial fibrillation: Secondary | ICD-10-CM

## 2017-01-08 DIAGNOSIS — R5383 Other fatigue: Secondary | ICD-10-CM

## 2017-01-08 LAB — COMPREHENSIVE METABOLIC PANEL
ALT: 13 U/L (ref 0–53)
AST: 14 U/L (ref 0–37)
Albumin: 3.5 g/dL (ref 3.5–5.2)
Alkaline Phosphatase: 51 U/L (ref 39–117)
BUN: 57 mg/dL — ABNORMAL HIGH (ref 6–23)
CALCIUM: 9.5 mg/dL (ref 8.4–10.5)
CHLORIDE: 108 meq/L (ref 96–112)
CO2: 24 meq/L (ref 19–32)
CREATININE: 1.63 mg/dL — AB (ref 0.40–1.50)
GFR: 42.29 mL/min — ABNORMAL LOW (ref >60.00)
Glucose, Bld: 106 mg/dL — ABNORMAL HIGH (ref 70–99)
Potassium: 4.9 mEq/L (ref 3.5–5.1)
Sodium: 138 mEq/L (ref 135–145)
Total Bilirubin: 0.9 mg/dL (ref 0.2–1.2)
Total Protein: 7 g/dL (ref 6.0–8.3)

## 2017-01-08 LAB — CBC WITH DIFFERENTIAL/PLATELET
BASOS PCT: 0.6 % (ref 0.0–3.0)
Basophils Absolute: 0.1 10*3/uL (ref 0.0–0.1)
EOS PCT: 1 % (ref 0.0–5.0)
Eosinophils Absolute: 0.1 10*3/uL (ref 0.0–0.7)
HCT: 27.3 % — ABNORMAL LOW (ref 39.0–52.0)
Hemoglobin: 8.8 g/dL — ABNORMAL LOW (ref 13.0–17.0)
LYMPHS ABS: 0.9 10*3/uL (ref 0.7–4.0)
Lymphocytes Relative: 10.3 % — ABNORMAL LOW (ref 12.0–46.0)
MCHC: 32.2 g/dL (ref 30.0–36.0)
MCV: 84.9 fl (ref 78.0–100.0)
MONOS PCT: 7.8 % (ref 3.0–12.0)
Monocytes Absolute: 0.7 10*3/uL (ref 0.1–1.0)
NEUTROS ABS: 7.2 10*3/uL (ref 1.4–7.7)
Neutrophils Relative %: 80.3 % — ABNORMAL HIGH (ref 43.0–77.0)
PLATELETS: 155 10*3/uL (ref 150.0–400.0)
RBC: 3.22 Mil/uL — ABNORMAL LOW (ref 4.22–5.81)
RDW: 20 % — AB (ref 11.5–15.5)
WBC: 9 10*3/uL (ref 4.0–10.5)

## 2017-01-08 LAB — T4, FREE: Free T4: 0.89 ng/dL (ref 0.60–1.60)

## 2017-01-08 LAB — TSH: TSH: 4.35 u[IU]/mL (ref 0.35–4.50)

## 2017-01-08 LAB — POCT INR: INR: 5.4

## 2017-01-08 NOTE — Patient Instructions (Signed)
Pre visit review using our clinic review tool, if applicable. No additional management support is needed unless otherwise documented below in the visit note. 

## 2017-01-08 NOTE — Assessment & Plan Note (Signed)
His fatigue is likely multifactorial in nature. He does have a history of anemia but no acute blood loss. Has some hypothyroidism upon chart review which could be contributing. He likely has some component of dementia. - CBC, TSH, CMP - If lab work is concerning may need supplementation or correction of medications. - If lab work is unrevealing then his symptoms are likely associated with dementia

## 2017-01-08 NOTE — Progress Notes (Addendum)
[name] - 83 y.o. male MRN [id number]  Date of birth: [birth year]  SUBJECTIVE:  Including CC & ROS.  Chief Complaint  Patient presents with  . sleep    patient sleeping 14 hours daily---can go to bed at 10:30 and not get up until 4pm----some pain around rib cage when he breathes deeply---some loose stools with incontinence    [name] is a 83 y.o. male that is presenting with increased somnolence and fatigue. History is provided by his son. Reports he has had increased bouts of incontinence but has suffered from stool incontinence for about a year now. Denies any recent illnesses. Has not had any fevers. Has been sleeping about 14 hours per day which is unusual for him. He denies any specific problems and says he feels fine. Has not had any recent changes in medications. He was recently treated for a chest pain with prednisone. He does not have a cough or congestion.  Review of his blood work from February 2018 shows an anemia.  Review of his blood work from October 2017 shows an elevated TSH. Hemoglobin A1c at that time was 6.3.  A review of his echo from 2012 shows that he has 50-55% ejection fraction, moderate concentric left ventricular hypertrophy, moderate to severe mitral valve regurgitation.   Review of Systems  Constitutional: Positive for activity change and fatigue. Negative for fever.  Respiratory: Negative for cough and shortness of breath.     HISTORY: Past Medical, Surgical, Social, and Family History Reviewed & Updated per EMR.   Pertinent Historical Findings include:  Past Medical History:  Diagnosis Date  . Arthritis    "right wrist and hand" (02/25/2012)  . Atrial fibrillation ([place])   . Carotid artery occlusion   . Diverticula of colon   . Enlarged prostate   . Hypertension   . Knee pain, right    "twisted it couple days ago; pain getting worse since" (02/25/2012)  . Skin cancer of face     Past Surgical History:  Procedure Laterality Date  .  CAROTID ENDARTERECTOMY  12/02/2009   RIGHT  cea  . COLON RESECTION  ~ 2010  . INGUINAL HERNIA REPAIR  ~ 2011   "left" (02/25/2012)  . JOINT REPLACEMENT Right   . SKIN CANCER EXCISION     "q once in awhile; on my face" (02/25/2012)  . TOTAL HIP ARTHROPLASTY  1980's   "right" (02/25/2012)    No Known Allergies  Family History  Problem Relation Age of Onset  . Heart disease Father        Heart Disease before age 19  . Heart attack Father   . Healthy Mother   . Hypertension Brother   . Heart attack Brother   . Hypertension Brother   . Stroke Brother   . Heart disease Brother   . Hypertension Brother   . Stroke Brother   . Heart disease Brother   . Cancer Sister      Social History   Social History  . Marital status: Widowed    Spouse name: N/A  . Number of children: N/A  . Years of education: N/A   Occupational History  . Not on file.   Social History Main Topics  . Smoking status: Former Smoker    Packs/day: 1.00    Years: 33.00    Types: Cigarettes    Quit date: 03/16/1978  . Smokeless tobacco: Never Used     Comment: 02/25/2012 "quit smoking in 1980"  . Alcohol use  No  . Drug use: No  . Sexual activity: Not Currently   Other Topics Concern  . Not on file   Social History Narrative  . No narrative on file     PHYSICAL EXAM:  VS: BP (!) 122/52   Pulse 75   Temp (!) 97.5 F (36.4 C)   Wt 163 lb (73.9 kg)   SpO2 98%   BMI 22.73 kg/m  Physical Exam Gen: NAD, alert, cooperative with exam,  ENT: normal lips, normal nasal mucosa,  Eye: normal EOM, normal conjunctiva and lids CV:  no edema, +2 pedal pulses S1-S2   Resp: no accessory muscle use, non-labored, lear to auscultation bilaterally, no crackles or wheezes Skin: no rashes, no areas of induration  Neuro: normal tone, normal sensation to touch, alert and oriented to self and date of birth. Not oriented to place, time, or months. Psych:  normal insight, alert  MSK: in a wheelchair, normal grip  strength      ASSESSMENT & PLAN:   Fatigue His fatigue is likely multifactorial in nature. He does have a history of anemia but no acute blood loss. Has some hypothyroidism upon chart review which could be contributing. He likely has some component of dementia. - CBC, TSH, CMP - If lab work is concerning may need supplementation or correction of medications. - If lab work is unrevealing then his symptoms are likely associated with dementia

## 2017-01-08 NOTE — Patient Instructions (Addendum)
Thank you for coming in,   We will call you with the results from today.    Please feel free to call with any questions or concerns at any time, at [phone]. --Dr. Schmitz  

## 2017-01-11 ENCOUNTER — Telehealth: Payer: Self-pay | Admitting: Family Medicine

## 2017-01-11 NOTE — Telephone Encounter (Signed)
Daughter called back.  Gave daughter Dr. Raeford Razor response.  Daughter is requesting call back.

## 2017-01-11 NOTE — Telephone Encounter (Signed)
Spoke with patient's daughter Peter Congo, given lab results. Verbalized understanding. States his energy is starting to improve.

## 2017-01-11 NOTE — Telephone Encounter (Signed)
Tried to leave a VM for patient's daughter. If she calls back please have her speak with a nurse/CMA and inform that his labs were at his baseline and nothing was drastically out of order.   If any questions then please take the best time and phone number to call and I will try to call her back.   Rosemarie Ax, MD [place] [facility] [facility] 01/11/2017, 9:46 AM

## 2017-01-20 ENCOUNTER — Ambulatory Visit: Payer: Medicare HMO | Admitting: Vascular Surgery

## 2017-01-20 ENCOUNTER — Other Ambulatory Visit: Payer: Self-pay | Admitting: Internal Medicine

## 2017-01-20 ENCOUNTER — Other Ambulatory Visit (HOSPITAL_COMMUNITY): Payer: Medicare HMO

## 2017-01-20 DIAGNOSIS — I480 Paroxysmal atrial fibrillation: Secondary | ICD-10-CM

## 2017-01-22 ENCOUNTER — Ambulatory Visit: Payer: Medicare HMO | Admitting: General Practice

## 2017-01-22 DIAGNOSIS — Z7901 Long term (current) use of anticoagulants: Secondary | ICD-10-CM | POA: Diagnosis not present

## 2017-01-22 LAB — POCT INR: INR: 5

## 2017-01-22 NOTE — Patient Instructions (Signed)
Pre visit review using our clinic review tool, if applicable. No additional management support is needed unless otherwise documented below in the visit note. 

## 2017-02-02 ENCOUNTER — Ambulatory Visit: Payer: Medicare HMO | Admitting: General Practice

## 2017-02-02 DIAGNOSIS — I4891 Unspecified atrial fibrillation: Secondary | ICD-10-CM | POA: Diagnosis not present

## 2017-02-02 DIAGNOSIS — Z7901 Long term (current) use of anticoagulants: Secondary | ICD-10-CM | POA: Diagnosis not present

## 2017-02-02 LAB — POCT INR: INR: 2.4

## 2017-02-02 NOTE — Patient Instructions (Signed)
Pre visit review using our clinic review tool, if applicable. No additional management support is needed unless otherwise documented below in the visit note.  Continue to take 1 tablet daily except 1 1/2 tablets on Wednesdays.  Re-check in 4 weeks in lab. Patient's son Nicki Reaper helps pt with getting to appointments and with his medication. [phone].

## 2017-02-03 ENCOUNTER — Encounter (HOSPITAL_COMMUNITY): Payer: Medicare HMO

## 2017-02-03 ENCOUNTER — Ambulatory Visit: Payer: Medicare HMO | Admitting: Family

## 2017-02-26 ENCOUNTER — Ambulatory Visit: Payer: Medicare HMO

## 2017-03-05 ENCOUNTER — Ambulatory Visit: Payer: Medicare HMO

## 2017-03-26 ENCOUNTER — Other Ambulatory Visit: Payer: Self-pay | Admitting: Internal Medicine

## 2017-03-26 DIAGNOSIS — I480 Paroxysmal atrial fibrillation: Secondary | ICD-10-CM

## 2017-03-29 ENCOUNTER — Ambulatory Visit (INDEPENDENT_AMBULATORY_CARE_PROVIDER_SITE_OTHER)
Admission: RE | Admit: 2017-03-29 | Discharge: 2017-03-29 | Disposition: A | Discharge status: Home / Self Care | Payer: Medicare HMO | Source: Ambulatory Visit | Attending: Internal Medicine | Admitting: Internal Medicine

## 2017-03-29 ENCOUNTER — Other Ambulatory Visit (INDEPENDENT_AMBULATORY_CARE_PROVIDER_SITE_OTHER): Payer: Medicare HMO

## 2017-03-29 ENCOUNTER — Ambulatory Visit (INDEPENDENT_AMBULATORY_CARE_PROVIDER_SITE_OTHER): Payer: Medicare HMO | Admitting: Internal Medicine

## 2017-03-29 ENCOUNTER — Encounter: Payer: Self-pay | Admitting: Internal Medicine

## 2017-03-29 VITALS — BP 140/58 | HR 68 | Temp 97.8°F | Ht 71.0 in | Wt 164.5 lb

## 2017-03-29 DIAGNOSIS — Z5181 Encounter for therapeutic drug level monitoring: Secondary | ICD-10-CM

## 2017-03-29 DIAGNOSIS — S4992XA Unspecified injury of left shoulder and upper arm, initial encounter: Secondary | ICD-10-CM

## 2017-03-29 DIAGNOSIS — S40022A Contusion of left upper arm, initial encounter: Secondary | ICD-10-CM

## 2017-03-29 DIAGNOSIS — M7989 Other specified soft tissue disorders: Secondary | ICD-10-CM | POA: Diagnosis not present

## 2017-03-29 DIAGNOSIS — S6992XA Unspecified injury of left wrist, hand and finger(s), initial encounter: Secondary | ICD-10-CM | POA: Diagnosis not present

## 2017-03-29 DIAGNOSIS — M79632 Pain in left forearm: Secondary | ICD-10-CM | POA: Diagnosis not present

## 2017-03-29 DIAGNOSIS — D539 Nutritional anemia, unspecified: Secondary | ICD-10-CM | POA: Diagnosis not present

## 2017-03-29 DIAGNOSIS — S59912A Unspecified injury of left forearm, initial encounter: Secondary | ICD-10-CM | POA: Diagnosis not present

## 2017-03-29 DIAGNOSIS — M79622 Pain in left upper arm: Secondary | ICD-10-CM | POA: Diagnosis not present

## 2017-03-29 LAB — PROTIME-INR
INR: 4.6 ratio — ABNORMAL HIGH (ref 0.8–1.0)
PROTHROMBIN TIME: 49 s — AB (ref 9.6–13.1)

## 2017-03-29 NOTE — Patient Instructions (Signed)
Anemia Anemia is a condition in which you do not have enough red blood cells or hemoglobin. Hemoglobin is a substance in red blood cells that carries oxygen. When you do not have enough red blood cells or hemoglobin (are anemic), your body cannot get enough oxygen and your organs may not work properly. As a result, you may feel very tired or have other problems. What are the causes? Common causes of anemia include:  Excessive bleeding. Anemia can be caused by excessive bleeding inside or outside the body, including bleeding from the intestine or from periods in women.  Poor nutrition.  Long-lasting (chronic) kidney, thyroid, and liver disease.  Bone marrow disorders.  Cancer and treatments for cancer.  HIV (human immunodeficiency virus) and AIDS (acquired immunodeficiency syndrome).  Treatments for HIV and AIDS.  Spleen problems.  Blood disorders.  Infections, medicines, and autoimmune disorders that destroy red blood cells.  What are the signs or symptoms? Symptoms of this condition include:  Minor weakness.  Dizziness.  Headache.  Feeling heartbeats that are irregular or faster than normal (palpitations).  Shortness of breath, especially with exercise.  Paleness.  Cold sensitivity.  Indigestion.  Nausea.  Difficulty sleeping.  Difficulty concentrating.  Symptoms may occur suddenly or develop slowly. If your anemia is mild, you may not have symptoms. How is this diagnosed? This condition is diagnosed based on:  Blood tests.  Your medical history.  A physical exam.  Bone marrow biopsy.  Your health care provider may also check your stool (feces) for blood and may do additional testing to look for the cause of your bleeding. You may also have other tests, including:  Imaging tests, such as a CT scan or MRI.  Endoscopy.  Colonoscopy.  How is this treated? Treatment for this condition depends on the cause. If you continue to lose a lot of blood,  you may need to be treated at a hospital. Treatment may include:  Taking supplements of iron, vitamin B12, or folic acid.  Taking a hormone medicine (erythropoietin) that can help to stimulate red blood cell growth.  Having a blood transfusion. This may be needed if you lose a lot of blood.  Making changes to your diet.  Having surgery to remove your spleen.  Follow these instructions at home:  Take over-the-counter and prescription medicines only as told by your health care provider.  Take supplements only as told by your health care provider.  Follow any diet instructions that you were given.  Keep all follow-up visits as told by your health care provider. This is important. Contact a health care provider if:  You develop new bleeding anywhere in the body. Get help right away if:  You are very weak.  You are short of breath.  You have pain in your abdomen or chest.  You are dizzy or feel faint.  You have trouble concentrating.  You have bloody or black, tarry stools.  You vomit repeatedly or you vomit up blood. Summary  Anemia is a condition in which you do not have enough red blood cells or enough of a substance in your red blood cells that carries oxygen (hemoglobin).  Symptoms may occur suddenly or develop slowly.  If your anemia is mild, you may not have symptoms.  This condition is diagnosed with blood tests as well as a medical history and physical exam. Other tests may be needed.  Treatment for this condition depends on the cause of the anemia. This information is not intended to replace advice   given to you by your health care provider. Make sure you discuss any questions you have with your health care provider. Document Released: 04/09/2004 Document Revised: 04/03/2016 Document Reviewed: 04/03/2016 Elsevier Interactive Patient Education  [employer].

## 2017-03-29 NOTE — Progress Notes (Addendum)
Subjective:  Patient ID: [name], male    DOB: 1923-05-12  Age: 84 y.o. MRN: [id number]  CC: Anemia and Arm Injury   HPI [name] presents for an anemia recheck and concerns about his left arm.  He fell one night prior to this visit and injured his left upper extremity.  He complains of pain from his elbow down to his hand.  He has noticed dependent swelling and bruising in his hand.  He is able to use his fingers with no difficulty.  He denies numbness, weakness, or tingling in his left upper extremity.  He does have some discomfort with range of motion in the shoulder, elbow, and wrist.  He describes a fall as being a mechanical event.  Outpatient Medications Prior to Visit  Medication Sig Dispense Refill  . allopurinol (ZYLOPRIM) 100 MG tablet Take by mouth daily. Take 2 tablets by mouth daily for gout prevention    . amLODipine (NORVASC) 10 MG tablet Take 10 mg by mouth daily.     Marland Kitchen atenolol (TENORMIN) 25 MG tablet Take 25 mg by mouth daily.    . diclofenac sodium (VOLTAREN) 1 % GEL Apply 2 g topically 4 (four) times daily. 100 g 11  . digoxin (LANOXIN) 0.125 MG tablet Take 1 tablet (0.125 mg total) by mouth daily. 90 tablet 3  . lisinopril (PRINIVIL,ZESTRIL) 40 MG tablet Take 40 mg by mouth daily.    . Omega-3 Fatty Acids (FISH OIL) 1000 MG CAPS Take 1,000 mg by mouth daily.    . Tamsulosin HCl (FLOMAX) 0.4 MG CAPS Take 0.4 mg by mouth daily.    Marland Kitchen warfarin (COUMADIN) 5 MG tablet TAKE AS DIRECTED BY ANTICOAGULATION CLINIC. 145 tablet 0  . colchicine 0.6 MG tablet Take 1 tablet (0.6 mg total) by mouth 2 (two) times daily. 60 tablet 11  . predniSONE (DELTASONE) 20 MG tablet Take 2 tablets (40 mg total) by mouth daily with breakfast. (Patient not taking: Reported on 01/08/2017) 10 tablet 0   No facility-administered medications prior to visit.     ROS Review of Systems  Constitutional: Negative for diaphoresis and fatigue.  HENT: Negative.   Eyes: Negative for visual  disturbance.  Respiratory: Negative for cough, chest tightness, shortness of breath and wheezing.   Cardiovascular: Negative for chest pain, palpitations and leg swelling.  Gastrointestinal: Negative for abdominal pain, constipation, diarrhea, nausea and vomiting.  Endocrine: Negative.   Genitourinary: Negative.   Musculoskeletal: Positive for arthralgias. Negative for back pain, gait problem, joint swelling, myalgias, neck pain and neck stiffness.  Skin: Negative for color change.  Allergic/Immunologic: Negative.   Neurological: Negative.  Negative for dizziness, syncope, weakness, numbness and headaches.  Hematological: Negative for adenopathy. Bruises/bleeds easily.  Psychiatric/Behavioral: Negative.     Objective:  BP (!) 140/58 (BP Location: Right Arm, Patient Position: Sitting, Cuff Size: Normal)   Pulse 68   Temp 97.8 F (36.6 C) (Oral)   Ht 5\' 11"  (1.803 m)   Wt 164 lb 8 oz (74.6 kg)   SpO2 94%   BMI 22.94 kg/m   BP Readings from Last 3 Encounters:  03/29/17 (!) 140/58  01/08/17 (!) 122/52  12/16/16 (!) 142/64    Wt Readings from Last 3 Encounters:  03/29/17 164 lb 8 oz (74.6 kg)  01/08/17 163 lb (73.9 kg)  12/16/16 173 lb (78.5 kg)    Physical Exam  Constitutional: He is oriented to person, place, and time. No distress.  HENT:  Mouth/Throat: Oropharynx is  clear and moist. No oropharyngeal exudate.  Eyes: Conjunctivae are normal. Left eye exhibits no discharge.  Neck: Normal range of motion. Neck supple. No JVD present. No thyromegaly present.  Cardiovascular: Normal rate, regular rhythm and normal heart sounds. Exam reveals no gallop.  No murmur heard. Pulses:      Radial pulses are 1+ on the right side, and 1+ on the left side.  Pulmonary/Chest: Effort normal and breath sounds normal. No respiratory distress. He has no wheezes. He has no rales.  Abdominal: Soft. Bowel sounds are normal. He exhibits no mass. There is no tenderness. There is no guarding.    Musculoskeletal:       Left shoulder: He exhibits decreased range of motion and tenderness. He exhibits no bony tenderness, no swelling, no deformity, no laceration, no spasm and normal strength.       Left elbow: He exhibits decreased range of motion and swelling. He exhibits no effusion, no deformity and no laceration. Tenderness found.       Left wrist: He exhibits tenderness and swelling. He exhibits normal range of motion, no bony tenderness, no effusion, no crepitus and no deformity.       Left upper arm: He exhibits tenderness. He exhibits no bony tenderness, no swelling, no edema, no deformity and no laceration.       Left forearm: He exhibits tenderness, swelling and edema. He exhibits no bony tenderness, no deformity and no laceration.       Left hand: He exhibits swelling. He exhibits normal range of motion, no tenderness, no bony tenderness and normal capillary refill.  There is diffuse swelling and ecchymosis most predominantly on the dorsum of the left hand but it does extend onto the dorsum of the left forearm.  Lymphadenopathy:    He has no cervical adenopathy.  Neurological: He is alert and oriented to person, place, and time.  Skin: Skin is warm and dry. No rash noted. He is not diaphoretic. No erythema. No pallor.  Vitals reviewed.   Lab Results  Component Value Date   WBC 9.0 01/08/2017   HGB 8.8 (L) 01/08/2017   HCT 27.3 (L) 01/08/2017   PLT 155.0 01/08/2017   GLUCOSE 106 (H) 01/08/2017   CHOL 166 01/01/2016   TRIG 168.0 (H) 01/01/2016   HDL 34.40 (L) 01/01/2016   LDLCALC 98 01/01/2016   ALT 13 01/08/2017   AST 14 01/08/2017   NA 138 01/08/2017   K 4.9 01/08/2017   CL 108 01/08/2017   CREATININE 1.63 (H) 01/08/2017   BUN 57 (H) 01/08/2017   CO2 24 01/08/2017   TSH 4.35 01/08/2017   INR 4.6 (H) 03/29/2017   HGBA1C 6.3 01/01/2016    Dg Chest 2 View  Result Date: 12/16/2016 CLINICAL DATA:  Right-sided chest pain for 2 weeks. History of hypertension and  atrial fibrillation. EXAM: CHEST  2 VIEW COMPARISON:  07/08/2015. FINDINGS: Cardiac silhouette is mildly enlarged. No mediastinal or hilar masses. No evidence of adenopathy. Minor linear opacities noted at the lung bases consistent with scarring. Lungs otherwise clear. No pleural effusion or pneumothorax. Skeletal structures are demineralized but intact. IMPRESSION: 1. No acute cardiopulmonary disease. 2. Mild cardiomegaly. Electronically Signed   By: Lajean Manes M.D.   On: 12/16/2016 16:48    Assessment & Plan:   Lonie was seen today for anemia and arm injury.  Diagnoses and all orders for this visit:  Deficiency anemia- will recheck his CBC -     CBC with Differential/Platelet; Future  Injury of left upper arm, initial encounter- see below -     DG Humerus Left; Future -     DG Forearm Left; Future -     DG Hand Complete Left; Future  Encounter for therapeutic drug monitoring-his INR is elevated at 4.6 and he has acute injury with ecchymosis and swelling.  I have asked his family to hold his Coumadin for 3 days and then to recheck his INR. -     INR/PT; Future  Contusion of multiple sites of left upper extremity, initial encounter- Exam and x-rays are negative for fracture or laceration.  He does have a significant contusion with swelling and ecchymosis.  The family will help him elevate his left upper extremity as much as possible.  He wants something for pain so I prescribed oxycodone. -     oxyCODONE (OXY IR/ROXICODONE) 5 MG immediate release tablet; Take 1 tablet (5 mg total) by mouth every 4 (four) hours as needed for severe pain.   I have discontinued Kishaun M. Cornia's predniSONE. I am also having him start on oxyCODONE. Additionally, I am having him maintain his tamsulosin, Fish Oil, amLODipine, atenolol, digoxin, diclofenac sodium, lisinopril, allopurinol, and warfarin.  Meds ordered this encounter  Medications  . oxyCODONE (OXY IR/ROXICODONE) 5 MG immediate release tablet     Sig: Take 1 tablet (5 mg total) by mouth every 4 (four) hours as needed for severe pain.    Dispense:  20 tablet    Refill:  0     Follow-up: Return in about 1 week (around 04/05/2017).  Scarlette Calico, MD

## 2017-03-30 ENCOUNTER — Ambulatory Visit (INDEPENDENT_AMBULATORY_CARE_PROVIDER_SITE_OTHER): Payer: Medicare HMO | Admitting: General Practice

## 2017-03-30 ENCOUNTER — Telehealth: Payer: Self-pay | Admitting: Internal Medicine

## 2017-03-30 ENCOUNTER — Ambulatory Visit: Payer: Medicare HMO

## 2017-03-30 DIAGNOSIS — S4992XA Unspecified injury of left shoulder and upper arm, initial encounter: Secondary | ICD-10-CM | POA: Insufficient documentation

## 2017-03-30 DIAGNOSIS — S40022A Contusion of left upper arm, initial encounter: Secondary | ICD-10-CM | POA: Insufficient documentation

## 2017-03-30 DIAGNOSIS — Z7901 Long term (current) use of anticoagulants: Secondary | ICD-10-CM

## 2017-03-30 MED ORDER — OXYCODONE HCL 5 MG PO TABS: 5.0000 mg | ORAL_TABLET | ORAL | 0 refills | Status: DC | PRN

## 2017-03-30 NOTE — Telephone Encounter (Signed)
Copied from [place] [id number]. Topic: General - Other >> Mar 30, 2017 10:00 AM Lolita Rieger, RMA wrote: Reason for CRM: Pt daughter called and stated that her father was seen yesterday and he did not want any pain medication at that time but pt is in more pain today than he was yesterday and would like to get something for pain at this point. Please contact pt daughter @ [phone]

## 2017-03-30 NOTE — Patient Instructions (Addendum)
Pre visit review using our clinic review tool, if applicable. No additional management support is needed unless otherwise documented below in the visit note.  Called patient's daughter.  Hold patient's coumadin for 2 days and lower dosage to 1 tablet daily. Re-check in 1 week.  Patient's son Nicki Reaper helps pt with getting to appointments and with his medication. [phone].

## 2017-03-30 NOTE — Telephone Encounter (Signed)
Copied from [place] [id number]. Topic: General - Other >> Mar 30, 2017 10:00 AM Lolita Rieger, RMA wrote: Reason for CRM: Pt daughter called and stated that her father was seen yesterday and he did not want any pain medication at that time but pt is in more pain today than he was yesterday and would like to get something for pain at this point. Please contact pt daughter @ [phone]

## 2017-03-31 ENCOUNTER — Telehealth: Payer: Self-pay | Admitting: Internal Medicine

## 2017-03-31 ENCOUNTER — Telehealth: Payer: Self-pay

## 2017-03-31 DIAGNOSIS — N189 Chronic kidney disease, unspecified: Secondary | ICD-10-CM

## 2017-03-31 DIAGNOSIS — M109 Gout, unspecified: Secondary | ICD-10-CM

## 2017-03-31 DIAGNOSIS — M1000 Idiopathic gout, unspecified site: Secondary | ICD-10-CM

## 2017-03-31 MED ORDER — COLCHICINE 0.6 MG PO TABS: 0.6000 mg | ORAL_TABLET | Freq: Two times a day (BID) | ORAL | 3 refills | Status: AC

## 2017-03-31 NOTE — Telephone Encounter (Signed)
Reviewed chart pt is up-to-date sent refills to [facility].../lmb  

## 2017-03-31 NOTE — Telephone Encounter (Signed)
Pts daughter calling back and needs to speak with Dr. Ronnald Ramp nurse asap as she has to head back to [place] to work. She needs to know who Dr. Ronnald Ramp will be referring her dad to for home health? I did not see the referral in pts chart. He was seen on 03/29/17. Please advise

## 2017-03-31 NOTE — Telephone Encounter (Signed)
Per dr Ronnald Ramp, ok to place urgent home health referral just as patient had for last 6 months--previous referral has expired---I have entered referral request and advised lori/referrals, she is calling [facility] to urgently start referral

## 2017-03-31 NOTE — Telephone Encounter (Signed)
error 

## 2017-03-31 NOTE — Telephone Encounter (Signed)
Copied from [place] [phone]. Topic: Inquiry >> Mar 31, 2017  9:45 AM Malena Catholic I, NT wrote: Reason for CRM:pt Daughter call and want to  know went home care will Contac  them, or they need the num of Home care agency  ,thanks

## 2017-03-31 NOTE — Telephone Encounter (Signed)
Copied from [place] [id number]. Topic: Quick Communication - Rx Refill/Question >> Mar 31, 2017  9:52 AM Malena Catholic I, NT wrote: Medication: Colchicine 0.6 mg  Has the patient contacted their pharmacy yes   (Agent: If no, request that the patient contact the pharmacy for the refill yes  Preferred Pharmacy (with phone number or street name [facility] [phone]  Agent: Please be advised that RX refills may take up to 3 business days. We ask that you follow-up with your pharmacy.

## 2017-03-31 NOTE — Telephone Encounter (Signed)
Recd verbal ok from dr Ronnald Ramp to restart home health referral like the referral placed 6 months ago and [facility] urgent---I have placed referral and advised lori/referrals, she is calling [facility]---patient's daughter advised by tammy/sched

## 2017-04-01 NOTE — Telephone Encounter (Signed)
Faxed referral to [facility] [phone]  Did send msg to [facility] rep through epic yesterday

## 2017-04-01 NOTE — Telephone Encounter (Signed)
Pt's daughter called in. She said that she spoke with [facility] and they have not received referral for pt. I did advise of referral status. She would like to know if Cecille Rubin could refax to : [phone]

## 2017-04-06 ENCOUNTER — Ambulatory Visit (INDEPENDENT_AMBULATORY_CARE_PROVIDER_SITE_OTHER): Payer: Medicare HMO | Admitting: General Practice

## 2017-04-06 DIAGNOSIS — I4891 Unspecified atrial fibrillation: Secondary | ICD-10-CM

## 2017-04-06 DIAGNOSIS — F0390 Unspecified dementia without behavioral disturbance: Secondary | ICD-10-CM | POA: Diagnosis not present

## 2017-04-06 DIAGNOSIS — Z7901 Long term (current) use of anticoagulants: Secondary | ICD-10-CM | POA: Diagnosis not present

## 2017-04-06 DIAGNOSIS — D539 Nutritional anemia, unspecified: Secondary | ICD-10-CM | POA: Diagnosis not present

## 2017-04-06 DIAGNOSIS — I129 Hypertensive chronic kidney disease with stage 1 through stage 4 chronic kidney disease, or unspecified chronic kidney disease: Secondary | ICD-10-CM | POA: Diagnosis not present

## 2017-04-06 DIAGNOSIS — S4992XD Unspecified injury of left shoulder and upper arm, subsequent encounter: Secondary | ICD-10-CM | POA: Diagnosis not present

## 2017-04-06 DIAGNOSIS — S40022D Contusion of left upper arm, subsequent encounter: Secondary | ICD-10-CM | POA: Diagnosis not present

## 2017-04-06 LAB — POCT INR: INR: 4.2

## 2017-04-06 NOTE — Patient Instructions (Signed)
Pre visit review using our clinic review tool, if applicable. No additional management support is needed unless otherwise documented below in the visit note.  Hold patient's coumadin for 2 days and give patient 2.5 mg on Thursday, 5 mg on Friday, 2.5 mg on Sat and Sunday and 5 mg on Monday.  Re-check on Tuesday.  Dosing instructions given to Dorice Lamas, RN @ [place] [phone].

## 2017-04-07 DIAGNOSIS — I4891 Unspecified atrial fibrillation: Secondary | ICD-10-CM | POA: Diagnosis not present

## 2017-04-07 DIAGNOSIS — D539 Nutritional anemia, unspecified: Secondary | ICD-10-CM | POA: Diagnosis not present

## 2017-04-07 DIAGNOSIS — S40022D Contusion of left upper arm, subsequent encounter: Secondary | ICD-10-CM | POA: Diagnosis not present

## 2017-04-07 DIAGNOSIS — F0390 Unspecified dementia without behavioral disturbance: Secondary | ICD-10-CM | POA: Diagnosis not present

## 2017-04-07 DIAGNOSIS — S4992XD Unspecified injury of left shoulder and upper arm, subsequent encounter: Secondary | ICD-10-CM | POA: Diagnosis not present

## 2017-04-07 DIAGNOSIS — I129 Hypertensive chronic kidney disease with stage 1 through stage 4 chronic kidney disease, or unspecified chronic kidney disease: Secondary | ICD-10-CM | POA: Diagnosis not present

## 2017-04-09 ENCOUNTER — Telehealth: Payer: Self-pay | Admitting: Internal Medicine

## 2017-04-09 DIAGNOSIS — I4891 Unspecified atrial fibrillation: Secondary | ICD-10-CM | POA: Diagnosis not present

## 2017-04-09 DIAGNOSIS — D539 Nutritional anemia, unspecified: Secondary | ICD-10-CM | POA: Diagnosis not present

## 2017-04-09 DIAGNOSIS — S40022D Contusion of left upper arm, subsequent encounter: Secondary | ICD-10-CM | POA: Diagnosis not present

## 2017-04-09 DIAGNOSIS — I129 Hypertensive chronic kidney disease with stage 1 through stage 4 chronic kidney disease, or unspecified chronic kidney disease: Secondary | ICD-10-CM | POA: Diagnosis not present

## 2017-04-09 DIAGNOSIS — S4992XD Unspecified injury of left shoulder and upper arm, subsequent encounter: Secondary | ICD-10-CM | POA: Diagnosis not present

## 2017-04-09 DIAGNOSIS — F0390 Unspecified dementia without behavioral disturbance: Secondary | ICD-10-CM | POA: Diagnosis not present

## 2017-04-09 NOTE — Telephone Encounter (Signed)
Copied from [place]. Topic: Quick Communication - See Telephone Encounter >> Apr 09, 2017 12:08 PM Vernona Rieger wrote: CRM for notification. See Telephone encounter for:   04/09/17.  Don from [facility] called and needs OT for 1 time a week for 1 week, 0 times a week for one week, one time a week for one week Therapeutic exercise & home exercise program. Will be Discharged when goals are met Call back is [phone]

## 2017-04-09 NOTE — Telephone Encounter (Signed)
Left detailed message for [facility] with verbal okay as requested.

## 2017-04-12 ENCOUNTER — Ambulatory Visit (INDEPENDENT_AMBULATORY_CARE_PROVIDER_SITE_OTHER): Payer: Medicare HMO | Admitting: General Practice

## 2017-04-12 DIAGNOSIS — Z7901 Long term (current) use of anticoagulants: Secondary | ICD-10-CM | POA: Diagnosis not present

## 2017-04-12 DIAGNOSIS — S4992XD Unspecified injury of left shoulder and upper arm, subsequent encounter: Secondary | ICD-10-CM | POA: Diagnosis not present

## 2017-04-12 DIAGNOSIS — I4891 Unspecified atrial fibrillation: Secondary | ICD-10-CM

## 2017-04-12 DIAGNOSIS — D539 Nutritional anemia, unspecified: Secondary | ICD-10-CM | POA: Diagnosis not present

## 2017-04-12 DIAGNOSIS — S40022D Contusion of left upper arm, subsequent encounter: Secondary | ICD-10-CM | POA: Diagnosis not present

## 2017-04-12 DIAGNOSIS — I129 Hypertensive chronic kidney disease with stage 1 through stage 4 chronic kidney disease, or unspecified chronic kidney disease: Secondary | ICD-10-CM | POA: Diagnosis not present

## 2017-04-12 DIAGNOSIS — F0390 Unspecified dementia without behavioral disturbance: Secondary | ICD-10-CM | POA: Diagnosis not present

## 2017-04-12 LAB — POCT INR: INR: 2.4

## 2017-04-12 NOTE — Patient Instructions (Signed)
Pre visit review using our clinic review tool, if applicable. No additional management support is needed unless otherwise documented below in the visit note.  Please take 5 mg daily except 2.5 mg on Monday/Wednesday/Friday.  Dosing instructions given to Dorice Lamas, RN @ [place] [phone].  Also called Peter Congo, patient's daughter.

## 2017-04-13 ENCOUNTER — Ambulatory Visit: Payer: Medicare HMO

## 2017-04-15 DIAGNOSIS — S40022D Contusion of left upper arm, subsequent encounter: Secondary | ICD-10-CM | POA: Diagnosis not present

## 2017-04-15 DIAGNOSIS — D539 Nutritional anemia, unspecified: Secondary | ICD-10-CM | POA: Diagnosis not present

## 2017-04-15 DIAGNOSIS — I4891 Unspecified atrial fibrillation: Secondary | ICD-10-CM | POA: Diagnosis not present

## 2017-04-15 DIAGNOSIS — I129 Hypertensive chronic kidney disease with stage 1 through stage 4 chronic kidney disease, or unspecified chronic kidney disease: Secondary | ICD-10-CM | POA: Diagnosis not present

## 2017-04-15 DIAGNOSIS — F0390 Unspecified dementia without behavioral disturbance: Secondary | ICD-10-CM | POA: Diagnosis not present

## 2017-04-15 DIAGNOSIS — S4992XD Unspecified injury of left shoulder and upper arm, subsequent encounter: Secondary | ICD-10-CM | POA: Diagnosis not present

## 2017-04-19 ENCOUNTER — Ambulatory Visit (INDEPENDENT_AMBULATORY_CARE_PROVIDER_SITE_OTHER): Payer: Medicare HMO | Admitting: General Practice

## 2017-04-19 DIAGNOSIS — D539 Nutritional anemia, unspecified: Secondary | ICD-10-CM | POA: Diagnosis not present

## 2017-04-19 DIAGNOSIS — Z7901 Long term (current) use of anticoagulants: Secondary | ICD-10-CM

## 2017-04-19 DIAGNOSIS — I4891 Unspecified atrial fibrillation: Secondary | ICD-10-CM

## 2017-04-19 DIAGNOSIS — S4992XD Unspecified injury of left shoulder and upper arm, subsequent encounter: Secondary | ICD-10-CM | POA: Diagnosis not present

## 2017-04-19 DIAGNOSIS — S40022D Contusion of left upper arm, subsequent encounter: Secondary | ICD-10-CM | POA: Diagnosis not present

## 2017-04-19 DIAGNOSIS — I129 Hypertensive chronic kidney disease with stage 1 through stage 4 chronic kidney disease, or unspecified chronic kidney disease: Secondary | ICD-10-CM | POA: Diagnosis not present

## 2017-04-19 DIAGNOSIS — F0390 Unspecified dementia without behavioral disturbance: Secondary | ICD-10-CM | POA: Diagnosis not present

## 2017-04-19 LAB — POCT INR: INR: 2.2

## 2017-04-19 NOTE — Patient Instructions (Addendum)
Pre visit review using our clinic review tool, if applicable. No additional management support is needed unless otherwise documented below in the visit note.  Please take 5 mg daily except 2.5 mg on Monday/Wednesday/Friday.  Dosing instructions given to Dorice Lamas, RN @ [place] [phone].  Also called Peter Congo, patient's daughter.

## 2017-04-20 DIAGNOSIS — S4992XD Unspecified injury of left shoulder and upper arm, subsequent encounter: Secondary | ICD-10-CM | POA: Diagnosis not present

## 2017-04-20 DIAGNOSIS — I129 Hypertensive chronic kidney disease with stage 1 through stage 4 chronic kidney disease, or unspecified chronic kidney disease: Secondary | ICD-10-CM | POA: Diagnosis not present

## 2017-04-20 DIAGNOSIS — D539 Nutritional anemia, unspecified: Secondary | ICD-10-CM | POA: Diagnosis not present

## 2017-04-20 DIAGNOSIS — S40022D Contusion of left upper arm, subsequent encounter: Secondary | ICD-10-CM | POA: Diagnosis not present

## 2017-04-20 DIAGNOSIS — F0390 Unspecified dementia without behavioral disturbance: Secondary | ICD-10-CM | POA: Diagnosis not present

## 2017-04-20 DIAGNOSIS — I4891 Unspecified atrial fibrillation: Secondary | ICD-10-CM | POA: Diagnosis not present

## 2017-04-22 DIAGNOSIS — S40022D Contusion of left upper arm, subsequent encounter: Secondary | ICD-10-CM | POA: Diagnosis not present

## 2017-04-22 DIAGNOSIS — F0390 Unspecified dementia without behavioral disturbance: Secondary | ICD-10-CM | POA: Diagnosis not present

## 2017-04-22 DIAGNOSIS — S4992XD Unspecified injury of left shoulder and upper arm, subsequent encounter: Secondary | ICD-10-CM | POA: Diagnosis not present

## 2017-04-22 DIAGNOSIS — I129 Hypertensive chronic kidney disease with stage 1 through stage 4 chronic kidney disease, or unspecified chronic kidney disease: Secondary | ICD-10-CM | POA: Diagnosis not present

## 2017-04-22 DIAGNOSIS — I4891 Unspecified atrial fibrillation: Secondary | ICD-10-CM | POA: Diagnosis not present

## 2017-04-22 DIAGNOSIS — D539 Nutritional anemia, unspecified: Secondary | ICD-10-CM | POA: Diagnosis not present

## 2017-04-26 DIAGNOSIS — I129 Hypertensive chronic kidney disease with stage 1 through stage 4 chronic kidney disease, or unspecified chronic kidney disease: Secondary | ICD-10-CM | POA: Diagnosis not present

## 2017-04-26 DIAGNOSIS — S40022D Contusion of left upper arm, subsequent encounter: Secondary | ICD-10-CM | POA: Diagnosis not present

## 2017-04-26 DIAGNOSIS — I4891 Unspecified atrial fibrillation: Secondary | ICD-10-CM | POA: Diagnosis not present

## 2017-04-26 DIAGNOSIS — D539 Nutritional anemia, unspecified: Secondary | ICD-10-CM | POA: Diagnosis not present

## 2017-04-26 DIAGNOSIS — S4992XD Unspecified injury of left shoulder and upper arm, subsequent encounter: Secondary | ICD-10-CM | POA: Diagnosis not present

## 2017-04-26 DIAGNOSIS — F0390 Unspecified dementia without behavioral disturbance: Secondary | ICD-10-CM | POA: Diagnosis not present

## 2017-04-27 ENCOUNTER — Ambulatory Visit (HOSPITAL_COMMUNITY)
Admission: RE | Admit: 2017-04-27 | Discharge: 2017-04-27 | Disposition: A | Discharge status: Home / Self Care | Payer: Medicare HMO | Source: Ambulatory Visit | Attending: Family | Admitting: Family

## 2017-04-27 ENCOUNTER — Encounter: Payer: Self-pay | Admitting: Family

## 2017-04-27 ENCOUNTER — Ambulatory Visit (INDEPENDENT_AMBULATORY_CARE_PROVIDER_SITE_OTHER)
Admission: RE | Admit: 2017-04-27 | Discharge: 2017-04-27 | Disposition: A | Discharge status: Home / Self Care | Payer: Medicare HMO | Source: Ambulatory Visit | Attending: Family | Admitting: Family

## 2017-04-27 ENCOUNTER — Ambulatory Visit (INDEPENDENT_AMBULATORY_CARE_PROVIDER_SITE_OTHER): Payer: Medicare HMO | Admitting: Family

## 2017-04-27 VITALS — BP 141/75 | HR 75 | Temp 97.0°F | Resp 16 | Ht 71.0 in | Wt 166.0 lb

## 2017-04-27 DIAGNOSIS — I6522 Occlusion and stenosis of left carotid artery: Secondary | ICD-10-CM | POA: Insufficient documentation

## 2017-04-27 DIAGNOSIS — I714 Abdominal aortic aneurysm, without rupture, unspecified: Secondary | ICD-10-CM

## 2017-04-27 DIAGNOSIS — F0390 Unspecified dementia without behavioral disturbance: Secondary | ICD-10-CM | POA: Diagnosis not present

## 2017-04-27 DIAGNOSIS — I6521 Occlusion and stenosis of right carotid artery: Secondary | ICD-10-CM

## 2017-04-27 DIAGNOSIS — I723 Aneurysm of iliac artery: Secondary | ICD-10-CM | POA: Insufficient documentation

## 2017-04-27 DIAGNOSIS — Z9889 Other specified postprocedural states: Secondary | ICD-10-CM

## 2017-04-27 DIAGNOSIS — D539 Nutritional anemia, unspecified: Secondary | ICD-10-CM | POA: Diagnosis not present

## 2017-04-27 DIAGNOSIS — I4891 Unspecified atrial fibrillation: Secondary | ICD-10-CM | POA: Diagnosis not present

## 2017-04-27 DIAGNOSIS — S4992XD Unspecified injury of left shoulder and upper arm, subsequent encounter: Secondary | ICD-10-CM | POA: Diagnosis not present

## 2017-04-27 DIAGNOSIS — S40022D Contusion of left upper arm, subsequent encounter: Secondary | ICD-10-CM | POA: Diagnosis not present

## 2017-04-27 DIAGNOSIS — I129 Hypertensive chronic kidney disease with stage 1 through stage 4 chronic kidney disease, or unspecified chronic kidney disease: Secondary | ICD-10-CM | POA: Diagnosis not present

## 2017-04-27 NOTE — Patient Instructions (Signed)
 Abdominal Aortic Aneurysm Blood pumps away from the heart through tubes (blood vessels) called arteries. Aneurysms are weak or damaged places in the wall of an artery. It bulges out like a balloon. An abdominal aortic aneurysm happens in the main artery of the body (aorta). It can burst or tear, causing bleeding inside the body. This is an emergency. It needs treatment right away. What are the causes? The exact cause is unknown. Things that could cause this problem include:  Fat and other substances building up in the lining of a tube.  Swelling of the walls of a blood vessel.  Certain tissue diseases.  Belly (abdominal) trauma.  An infection in the main artery of the body.  What increases the risk? There are things that make it more likely for you to have an aneurysm. These include:  Being over the age of 82 years old.  Having high blood pressure (hypertension).  Being a male.  Being white.  Being very overweight (obese).  Having a family history of aneurysm.  Using tobacco products.  What are the signs or symptoms? Symptoms depend on the size of the aneurysm and how fast it grows. There may not be symptoms. If symptoms occur, they can include:  Pain (belly, side, lower back, or groin).  Feeling full after eating a small amount of food.  Feeling sick to your stomach (nauseous), throwing up (vomiting), or both.  Feeling a lump in your belly that feels like it is beating (pulsating).  Feeling like you will pass out (faint).  How is this treated?  Medicine to control blood pressure and pain.  Imaging tests to see if the aneurysm gets bigger.  Surgery. How is this prevented? To lessen your chance of getting this condition:  Stop smoking. Stop chewing tobacco.  Limit or avoid alcohol.  Keep your blood pressure, blood sugar, and cholesterol within normal limits.  Eat less salt.  Eat foods low in saturated fats and cholesterol. These are found in animal  and whole dairy products.  Eat more fiber. Fiber is found in whole grains, vegetables, and fruits.  Keep a healthy weight.  Stay active and exercise often.  This information is not intended to replace advice given to you by your health care provider. Make sure you discuss any questions you have with your health care provider. Document Released: 06/27/2012 Document Revised: 08/08/2015 Document Reviewed: 04/01/2012 Elsevier Interactive Patient Education  2017 Else[employer] Inc.     Stroke Prevention Some health problems and behaviors may make it more likely for you to have a stroke. Below are ways to lessen your risk of having a stroke.  Be active for at least 30 minutes on most or all days.  Do not smoke. Try not to be around others who smoke.  Do not drink too much alcohol. ? Do not have more than 2 drinks a day if you are a man. ? Do not have more than 1 drink a day if you are a woman and are not pregnant.  Eat healthy foods, such as fruits and vegetables. If you were put on a specific diet, follow the diet as told.  Keep your cholesterol levels under control through diet and medicines. Look for foods that are low in saturated fat, trans fat, cholesterol, and are high in fiber.  If you have diabetes, follow all diet plans and take your medicine as told.  Ask your doctor if you need treatment to lower your blood pressure. If you have high blood   pressure (hypertension), follow all diet plans and take your medicine as told by your doctor.  If you are 18-39 years old, have your blood pressure checked every 3-5 years. If you are age 40 or older, have your blood pressure checked every year.  Keep a healthy weight. Eat foods that are low in calories, salt, saturated fat, trans fat, and cholesterol.  Do not take drugs.  Avoid birth control pills, if this applies. Talk to your doctor about the risks of taking birth control pills.  Talk to your doctor if you have sleep problems (sleep  apnea).  Take all medicine as told by your doctor. ? You may be told to take aspirin or blood thinner medicine. Take this medicine as told by your doctor. ? Understand your medicine instructions.  Make sure any other conditions you have are being taken care of.  Get help right away if:  You suddenly lose feeling (you feel numb) or have weakness in your face, arm, or leg.  Your face or eyelid hangs down to one side.  You suddenly feel confused.  You have trouble talking (aphasia) or understanding what people are saying.  You suddenly have trouble seeing in one or both eyes.  You suddenly have trouble walking.  You are dizzy.  You lose your balance or your movements are clumsy (uncoordinated).  You suddenly have a very bad headache and you do not know the cause.  You have new chest pain.  Your heart feels like it is fluttering or skipping a beat (irregular heartbeat). Do not wait to see if the symptoms above go away. Get help right away. Call your local emergency services (911 in [place] Do not drive yourself to the hospital. This information is not intended to replace advice given to you by your health care provider. Make sure you discuss any questions you have with your health care provider. Document Released: 09/01/2011 Document Revised: 08/08/2015 Document Reviewed: 09/02/2012 Elsevier Interactive Patient Education  2018 Elsevier Inc.  

## 2017-04-27 NOTE — Progress Notes (Signed)
[facility] [place] HISTORY AND PHYSICAL   CC: Follow up extracranial carotid artery stenosis and AAA   History of Present Illness:   [name] is a 84 y.o. male patient of Dr. Scot Dock who was apparently having some chest pain. This prompted a CT scan of the chest abdomen and pelvis. This did not show any evidence of dissection. However, an incidental finding was bilateral internal iliac artery aneurysms. For this reason the patient was referred for vascular consultation.  Dr. Scot Dock last saw pt on 07/17/15. At that time the patient had a 3.8 cm left internal iliac artery aneurysm. However this is thrombosed and no treatment is needed for this reason. He has a small 1.7 cm right internal iliac artery aneurysm which is also thrombosed. His distal abdominal aorta measures 3.2 cm in maximum diameter and thus would qualify as an aneurysm. For this reason Dr. Scot Dock recommended an ultrasound study in 18 months and Dr. Scot Dock or Dr. Donnetta Hutching will see him back at that time. He is not a smoker.   This patient underwent a right carotid endarterectomy in 2011 but then was lost to follow up. Dr. Scot Dock ordered a carotid duplex scan and an office visit with our nurse practitioner. Given his age, if his duplex looks normal that he may not need yearly studies but perhaps a follow up study in 2-3 years.  The patient has had no further chest pain. Patient denies any abdominal pain or back pain. He denies any postprandial abdominal pain. He denies any pelvic pain  He is currently receiving home physical therapy to improve his shuffling gait. He states "I have a bad right hip". He wants no more surgery in his right hip. His daughter states he has fallen a few times.   The patient denies any history of TIA or stroke symptoms, specifically the patient denies a history of amaurosis fugax or monocular blindness, denies a history unilateral  of facial drooping, denies a history of  hemiplegia, and denies a history of receptive or expressive aphasia.    The patient denies New Medical or Surgical History. He denies chest pain or dyspnea.  Pt Diabetic: no Pt smoker: former smoker, quit in 1980, smoked 1 ppd x 33 years   Pt meds include: Statin : no ASA: no Other anticoagulants/antiplatelets: warfarin, has atrial fib    Current Outpatient Medications  Medication Sig Dispense Refill  . allopurinol (ZYLOPRIM) 100 MG tablet Take by mouth daily. Take 2 tablets by mouth daily for gout prevention    . amLODipine (NORVASC) 10 MG tablet Take 10 mg by mouth daily.     Marland Kitchen atenolol (TENORMIN) 25 MG tablet Take 25 mg by mouth daily.    . colchicine 0.6 MG tablet Take 1 tablet (0.6 mg total) by mouth 2 (two) times daily. 60 tablet 3  . diclofenac sodium (VOLTAREN) 1 % GEL Apply 2 g topically 4 (four) times daily. 100 g 11  . digoxin (LANOXIN) 0.125 MG tablet Take 1 tablet (0.125 mg total) by mouth daily. 90 tablet 3  . lisinopril (PRINIVIL,ZESTRIL) 40 MG tablet Take 40 mg by mouth daily.    . Omega-3 Fatty Acids (FISH OIL) 1000 MG CAPS Take 1,000 mg by mouth daily.    . Tamsulosin HCl (FLOMAX) 0.4 MG CAPS Take 0.4 mg by mouth daily.    Marland Kitchen warfarin (COUMADIN) 5 MG tablet TAKE AS DIRECTED BY ANTICOAGULATION CLINIC. 145 tablet 0   No current facility-administered medications for this visit.  Past Medical History:  Diagnosis Date  . Arthritis    "right wrist and hand" (02/25/2012)  . Atrial fibrillation ([place])   . Carotid artery occlusion   . Diverticula of colon   . Enlarged prostate   . Hypertension   . Knee pain, right    "twisted it couple days ago; pain getting worse since" (02/25/2012)  . Skin cancer of face     Social History Social History   Tobacco Use  . Smoking status: Former Smoker    Packs/day: 1.00    Years: 33.00    Pack years: 33.00    Types: Cigarettes    Last attempt to quit: 03/16/1978    Years since quitting: 39.1  . Smokeless tobacco:  Never Used  . Tobacco comment: 02/25/2012 "quit smoking in 1980"  Substance Use Topics  . Alcohol use: No    Alcohol/week: 0.0 oz  . Drug use: No    Family History Family History  Problem Relation Age of Onset  . Heart disease Father        Heart Disease before age 53  . Heart attack Father   . Healthy Mother   . Hypertension Brother   . Heart attack Brother   . Hypertension Brother   . Stroke Brother   . Heart disease Brother   . Hypertension Brother   . Stroke Brother   . Heart disease Brother   . Cancer Sister     Surgical History Past Surgical History:  Procedure Laterality Date  . CAROTID ENDARTERECTOMY  12/02/2009   RIGHT  cea  . COLON RESECTION  ~ 2010  . INGUINAL HERNIA REPAIR  ~ 2011   "left" (02/25/2012)  . JOINT REPLACEMENT Right   . SKIN CANCER EXCISION     "q once in awhile; on my face" (02/25/2012)  . TOTAL HIP ARTHROPLASTY  1980's   "right" (02/25/2012)    No Known Allergies  Current Outpatient Medications  Medication Sig Dispense Refill  . allopurinol (ZYLOPRIM) 100 MG tablet Take by mouth daily. Take 2 tablets by mouth daily for gout prevention    . amLODipine (NORVASC) 10 MG tablet Take 10 mg by mouth daily.     Marland Kitchen atenolol (TENORMIN) 25 MG tablet Take 25 mg by mouth daily.    . colchicine 0.6 MG tablet Take 1 tablet (0.6 mg total) by mouth 2 (two) times daily. 60 tablet 3  . diclofenac sodium (VOLTAREN) 1 % GEL Apply 2 g topically 4 (four) times daily. 100 g 11  . digoxin (LANOXIN) 0.125 MG tablet Take 1 tablet (0.125 mg total) by mouth daily. 90 tablet 3  . lisinopril (PRINIVIL,ZESTRIL) 40 MG tablet Take 40 mg by mouth daily.    . Omega-3 Fatty Acids (FISH OIL) 1000 MG CAPS Take 1,000 mg by mouth daily.    . Tamsulosin HCl (FLOMAX) 0.4 MG CAPS Take 0.4 mg by mouth daily.    Marland Kitchen warfarin (COUMADIN) 5 MG tablet TAKE AS DIRECTED BY ANTICOAGULATION CLINIC. 145 tablet 0   No current facility-administered medications for this visit.      REVIEW OF  SYSTEMS: See HPI for pertinent positives and negatives.  Physical Examination Vitals:   04/27/17 1202 04/27/17 1204  BP: 137/72 (!) 141/75  Pulse: 75   Resp: 16   Temp: (!) 97 F (36.1 C)   TempSrc: Oral   SpO2: 99%   Weight: 166 lb (75.3 kg)   Height: 5\' 11"  (1.803 m)    Body mass index is 23.15  kg/m.  General:  WDWN elderly male in NAD GAIT: seated in wheelchair HENT: Full beard, no gross abnormalities  Eyes: PERRLA Pulmonary:  Non-labored respirations, CTAB. Cardiac: Irregular rhythm, no detected murmur.  VASCULAR EXAM Carotid Bruits Right Left   Negative Negative     Abdominal aortic pulse Aorta is not palpable. Radial pulses are 3+ palpable right and 2+ palpable left.                                                                                                                                          LE Pulses Right Left       FEMORAL  2+ palpable  2+ palpable        POPLITEAL  2+ palpable   2+ palpable       POSTERIOR TIBIAL  not palpable   faintly palpable        DORSALIS PEDIS      ANTERIOR TIBIAL faintly palpable  not palpable     Gastrointestinal: soft, nontender, BS WNL, no r/g, reducible small hernia about 2 inches right of umbilicus. Musculoskeletal: age related muscle atrophy/wasting. M/S 4/5 throughout, extremities without ischemic changes. Skin: No rash, no cellulitis, no ulcers noted.  Neurologic: A&O X 3; speech is loud, CN 2-12 intact except is hard of hearing, pain and light touch intact in extremities, motor exam as listed above. Psychiatric: Normal thought content, irritated mood.      ASSESSMENT:  AYLAN BAYONA is a 84 y.o. male who is s/p right carotid endarterectomy in 2011.  He has no history of stroke or TIA.  We are also monitoring a small asymptomatic AAA.   By CTA chest/abd/pelvis on 07/08/15, both internal iliac arteries are thrombosed and a small abdominal aortic aneurysm is present. Pt has 2+ palpable bilateral  popliteal pulses, feet are pink and warm. He does not walk much since his right hip is painful with walking.    DATA  Carotid Duplex (04/27/17): Right ICA: (CEA site), no restenosis Left ICA: 1-39% stenosis Bilateral vertebral artery flow is antegrade.  Bilateral subclavian artery waveforms are normal.  No significant change compared to the exams on 07-25-15 and 08-21-15.   AAA Duplex (04/27/17): 3.2 cm at the distal abdominal aorta, unchanged from 3.2 cm by CT in April 2017. Right CIA: 1.8 cm; Left CIA: 1.8 cm  Plan:  His physical therapist is showing him seated leg exercises.   Follow-up in 18 months with Carotid Duplex scan and already requested bilateral aortoiliac duplex.    I discussed in depth with the patient the nature of atherosclerosis, and emphasized the importance of maximal medical management including strict control of blood pressure, blood glucose, and lipid levels, obtaining regular exercise, and cessation of smoking.  The patient is aware that without maximal medical management the underlying atherosclerotic disease process will progress, limiting the benefit of any interventions.  Consideration for repair of  AAA would be made when the size approaches 5.0 cm, growth > 1 cm/yr, and symptomatic status. The patient was given information about AAA including signs, symptoms, treatment,  what symptoms should prompt the patient to seek immediate medical care, and how to minimize the risk of enlargement and rupture of aneurysms.   The patient was given information about stroke prevention and what symptoms should prompt the patient to seek immediate medical care.  The patient was given information about PAD including signs, symptoms, treatment, what symptoms should prompt the patient to seek immediate medical care, and risk reduction measures to take.  Thank you for allowing [place] to participate in this patient's care.  Clemon Chambers, RN, MSN, FNP-C [facility] Office: [phone]  Clinic MD: Early 04/27/2017 12:29 PM

## 2017-04-28 LAB — VAS US CAROTID
LCCAPDIAS: 19 cm/s
LEFT VERTEBRAL DIAS: 7 cm/s
LICADDIAS: -34 cm/s
LICADSYS: -95 cm/s
LICAPDIAS: -14 cm/s
LICAPSYS: -96 cm/s
Left CCA dist dias: -23 cm/s
Left CCA dist sys: -143 cm/s
Left CCA prox sys: 113 cm/s
RCCAPDIAS: 10 cm/s
RIGHT CCA MID DIAS: 8 cm/s
RIGHT VERTEBRAL DIAS: 18 cm/s
Right CCA prox sys: 86 cm/s
Right cca dist sys: -66 cm/s

## 2017-04-29 DIAGNOSIS — I4891 Unspecified atrial fibrillation: Secondary | ICD-10-CM | POA: Diagnosis not present

## 2017-04-29 DIAGNOSIS — I129 Hypertensive chronic kidney disease with stage 1 through stage 4 chronic kidney disease, or unspecified chronic kidney disease: Secondary | ICD-10-CM | POA: Diagnosis not present

## 2017-04-29 DIAGNOSIS — S40022D Contusion of left upper arm, subsequent encounter: Secondary | ICD-10-CM | POA: Diagnosis not present

## 2017-04-29 DIAGNOSIS — F0390 Unspecified dementia without behavioral disturbance: Secondary | ICD-10-CM | POA: Diagnosis not present

## 2017-04-29 DIAGNOSIS — D539 Nutritional anemia, unspecified: Secondary | ICD-10-CM | POA: Diagnosis not present

## 2017-04-29 DIAGNOSIS — S4992XD Unspecified injury of left shoulder and upper arm, subsequent encounter: Secondary | ICD-10-CM | POA: Diagnosis not present

## 2017-05-03 DIAGNOSIS — D539 Nutritional anemia, unspecified: Secondary | ICD-10-CM | POA: Diagnosis not present

## 2017-05-03 DIAGNOSIS — I4891 Unspecified atrial fibrillation: Secondary | ICD-10-CM | POA: Diagnosis not present

## 2017-05-03 DIAGNOSIS — S4992XD Unspecified injury of left shoulder and upper arm, subsequent encounter: Secondary | ICD-10-CM | POA: Diagnosis not present

## 2017-05-03 DIAGNOSIS — I129 Hypertensive chronic kidney disease with stage 1 through stage 4 chronic kidney disease, or unspecified chronic kidney disease: Secondary | ICD-10-CM | POA: Diagnosis not present

## 2017-05-03 DIAGNOSIS — S40022D Contusion of left upper arm, subsequent encounter: Secondary | ICD-10-CM | POA: Diagnosis not present

## 2017-05-03 DIAGNOSIS — F0390 Unspecified dementia without behavioral disturbance: Secondary | ICD-10-CM | POA: Diagnosis not present

## 2017-05-03 LAB — POCT INR: INR: 2.1

## 2017-05-04 DIAGNOSIS — D539 Nutritional anemia, unspecified: Secondary | ICD-10-CM | POA: Diagnosis not present

## 2017-05-04 DIAGNOSIS — I129 Hypertensive chronic kidney disease with stage 1 through stage 4 chronic kidney disease, or unspecified chronic kidney disease: Secondary | ICD-10-CM | POA: Diagnosis not present

## 2017-05-04 DIAGNOSIS — S40022D Contusion of left upper arm, subsequent encounter: Secondary | ICD-10-CM | POA: Diagnosis not present

## 2017-05-04 DIAGNOSIS — I4891 Unspecified atrial fibrillation: Secondary | ICD-10-CM | POA: Diagnosis not present

## 2017-05-04 DIAGNOSIS — S4992XD Unspecified injury of left shoulder and upper arm, subsequent encounter: Secondary | ICD-10-CM | POA: Diagnosis not present

## 2017-05-04 DIAGNOSIS — F0390 Unspecified dementia without behavioral disturbance: Secondary | ICD-10-CM | POA: Diagnosis not present

## 2017-05-05 ENCOUNTER — Ambulatory Visit (INDEPENDENT_AMBULATORY_CARE_PROVIDER_SITE_OTHER): Payer: Medicare HMO | Admitting: General Practice

## 2017-05-05 DIAGNOSIS — I4891 Unspecified atrial fibrillation: Secondary | ICD-10-CM | POA: Diagnosis not present

## 2017-05-05 DIAGNOSIS — Z7901 Long term (current) use of anticoagulants: Secondary | ICD-10-CM | POA: Diagnosis not present

## 2017-05-05 NOTE — Patient Instructions (Signed)
Please take 5 mg daily except 2.5 mg on Monday/Wednesday/Friday.  Also called Peter Congo, patient's daughter. Patient dc'd from [facility] on 2/17.

## 2017-05-05 NOTE — Progress Notes (Signed)
I have reviewed and agree with note, evaluation, plan.   Ainslie Mazurek, MD  

## 2017-05-11 ENCOUNTER — Telehealth: Payer: Self-pay | Admitting: Internal Medicine

## 2017-05-11 DIAGNOSIS — S4992XD Unspecified injury of left shoulder and upper arm, subsequent encounter: Secondary | ICD-10-CM | POA: Diagnosis not present

## 2017-05-11 DIAGNOSIS — D539 Nutritional anemia, unspecified: Secondary | ICD-10-CM | POA: Diagnosis not present

## 2017-05-11 DIAGNOSIS — F0390 Unspecified dementia without behavioral disturbance: Secondary | ICD-10-CM | POA: Diagnosis not present

## 2017-05-11 DIAGNOSIS — I4891 Unspecified atrial fibrillation: Secondary | ICD-10-CM | POA: Diagnosis not present

## 2017-05-11 DIAGNOSIS — I129 Hypertensive chronic kidney disease with stage 1 through stage 4 chronic kidney disease, or unspecified chronic kidney disease: Secondary | ICD-10-CM | POA: Diagnosis not present

## 2017-05-11 DIAGNOSIS — S40022D Contusion of left upper arm, subsequent encounter: Secondary | ICD-10-CM | POA: Diagnosis not present

## 2017-05-11 NOTE — Telephone Encounter (Signed)
Spoke with patient. The daughter is going to call back once she knows what days the patient can come in.   It is OKAY for them to transfer the call to me [place]). THANK YOU.

## 2017-05-11 NOTE — Telephone Encounter (Signed)
Copied from [place] [id number]. Topic: Quick Communication - See Telephone Encounter >> May 11, 2017 10:17 AM Lolita Rieger, RMA wrote: CRM for notification. See Telephone encounter for:   05/11/17.pt daughter called and stated that pt has had dark colored stool for a few days and would like to know if he should make an appointment to come in or if something can be called in for him [phone] [facility]

## 2017-05-18 ENCOUNTER — Ambulatory Visit: Payer: Medicare HMO | Admitting: Internal Medicine

## 2017-05-18 ENCOUNTER — Ambulatory Visit: Payer: Medicare HMO

## 2017-05-31 ENCOUNTER — Other Ambulatory Visit: Payer: Self-pay | Admitting: Internal Medicine

## 2017-05-31 DIAGNOSIS — I480 Paroxysmal atrial fibrillation: Secondary | ICD-10-CM

## 2017-06-01 ENCOUNTER — Ambulatory Visit: Payer: Medicare HMO

## 2017-06-01 ENCOUNTER — Ambulatory Visit: Payer: Medicare HMO | Admitting: Internal Medicine

## 2017-06-21 ENCOUNTER — Telehealth: Payer: Self-pay | Admitting: Internal Medicine

## 2017-06-21 NOTE — Telephone Encounter (Signed)
Is there a home device to check INR?

## 2017-06-21 NOTE — Telephone Encounter (Signed)
Copied from [place]. Topic: Quick Communication - See Telephone Encounter >> Jun 21, 2017 10:20 AM Bea Graff, NT wrote: CRM for notification. See Telephone encounter for: 06/21/17. Pts daughter [name] calling to is orders for home health can be put in for her dads INR checks? She states they have a hard time getting him to his appointments due to its hard to get her dad "mobile" and also transportation issues between her and her brother.

## 2017-06-21 NOTE — Telephone Encounter (Signed)
There is a company that I can get him set up with if his insurance will cover it.

## 2017-06-22 ENCOUNTER — Ambulatory Visit: Payer: Medicare HMO

## 2017-06-22 ENCOUNTER — Ambulatory Visit: Payer: Medicare HMO | Admitting: Internal Medicine

## 2017-06-23 NOTE — Telephone Encounter (Signed)
I will call patient's daughter about getting pt set with [employer] home monitoring.  We have been through this before and they declined the service but I will check again.  Thanks.

## 2017-07-08 ENCOUNTER — Ambulatory Visit: Payer: Self-pay | Admitting: *Deleted

## 2017-07-08 DIAGNOSIS — G9340 Encephalopathy, unspecified: Secondary | ICD-10-CM | POA: Diagnosis not present

## 2017-07-08 DIAGNOSIS — E872 Acidosis: Secondary | ICD-10-CM | POA: Diagnosis not present

## 2017-07-08 DIAGNOSIS — Z515 Encounter for palliative care: Secondary | ICD-10-CM | POA: Diagnosis not present

## 2017-07-08 DIAGNOSIS — K625 Hemorrhage of anus and rectum: Secondary | ICD-10-CM | POA: Diagnosis not present

## 2017-07-08 DIAGNOSIS — R262 Difficulty in walking, not elsewhere classified: Secondary | ICD-10-CM | POA: Diagnosis not present

## 2017-07-08 DIAGNOSIS — D509 Iron deficiency anemia, unspecified: Secondary | ICD-10-CM | POA: Diagnosis not present

## 2017-07-08 DIAGNOSIS — E871 Hypo-osmolality and hyponatremia: Secondary | ICD-10-CM | POA: Diagnosis not present

## 2017-07-08 DIAGNOSIS — F0390 Unspecified dementia without behavioral disturbance: Secondary | ICD-10-CM | POA: Diagnosis not present

## 2017-07-08 DIAGNOSIS — J9811 Atelectasis: Secondary | ICD-10-CM | POA: Diagnosis not present

## 2017-07-08 DIAGNOSIS — F0391 Unspecified dementia with behavioral disturbance: Secondary | ICD-10-CM | POA: Diagnosis not present

## 2017-07-08 DIAGNOSIS — D649 Anemia, unspecified: Secondary | ICD-10-CM | POA: Diagnosis not present

## 2017-07-08 DIAGNOSIS — N170 Acute kidney failure with tubular necrosis: Secondary | ICD-10-CM | POA: Diagnosis not present

## 2017-07-08 DIAGNOSIS — J181 Lobar pneumonia, unspecified organism: Secondary | ICD-10-CM | POA: Diagnosis not present

## 2017-07-08 DIAGNOSIS — K922 Gastrointestinal hemorrhage, unspecified: Secondary | ICD-10-CM | POA: Diagnosis not present

## 2017-07-08 DIAGNOSIS — R4182 Altered mental status, unspecified: Secondary | ICD-10-CM | POA: Diagnosis not present

## 2017-07-08 DIAGNOSIS — R5381 Other malaise: Secondary | ICD-10-CM | POA: Diagnosis not present

## 2017-07-08 DIAGNOSIS — J189 Pneumonia, unspecified organism: Secondary | ICD-10-CM | POA: Diagnosis not present

## 2017-07-08 DIAGNOSIS — R918 Other nonspecific abnormal finding of lung field: Secondary | ICD-10-CM | POA: Diagnosis not present

## 2017-07-08 DIAGNOSIS — N179 Acute kidney failure, unspecified: Secondary | ICD-10-CM | POA: Diagnosis not present

## 2017-07-08 DIAGNOSIS — E878 Other disorders of electrolyte and fluid balance, not elsewhere classified: Secondary | ICD-10-CM | POA: Diagnosis not present

## 2017-07-08 DIAGNOSIS — R0602 Shortness of breath: Secondary | ICD-10-CM | POA: Diagnosis not present

## 2017-07-08 DIAGNOSIS — N401 Enlarged prostate with lower urinary tract symptoms: Secondary | ICD-10-CM | POA: Diagnosis not present

## 2017-07-08 DIAGNOSIS — I110 Hypertensive heart disease with heart failure: Secondary | ICD-10-CM | POA: Diagnosis not present

## 2017-07-08 DIAGNOSIS — R451 Restlessness and agitation: Secondary | ICD-10-CM | POA: Diagnosis not present

## 2017-07-08 DIAGNOSIS — I130 Hypertensive heart and chronic kidney disease with heart failure and stage 1 through stage 4 chronic kidney disease, or unspecified chronic kidney disease: Secondary | ICD-10-CM | POA: Diagnosis not present

## 2017-07-08 DIAGNOSIS — I480 Paroxysmal atrial fibrillation: Secondary | ICD-10-CM | POA: Diagnosis not present

## 2017-07-08 DIAGNOSIS — J810 Acute pulmonary edema: Secondary | ICD-10-CM | POA: Diagnosis not present

## 2017-07-08 DIAGNOSIS — K5730 Diverticulosis of large intestine without perforation or abscess without bleeding: Secondary | ICD-10-CM | POA: Diagnosis not present

## 2017-07-08 DIAGNOSIS — I509 Heart failure, unspecified: Secondary | ICD-10-CM | POA: Diagnosis not present

## 2017-07-08 NOTE — Telephone Encounter (Signed)
Just spoke with Peter Congo, patient's daughter.  She is asking about home health to check her dad's INR.  I informed her that home health will not come out just to do that.  There has to be another indication for the visit.  I am trying to work with her to find a solution.

## 2017-07-08 NOTE — Telephone Encounter (Signed)
Pt daughter requesting [facility] nurse to come to home for INR. Pt is with his daughter but will be home 07/12/17. Please call her to advise ph# [phone].

## 2017-07-08 NOTE — Telephone Encounter (Signed)
  I returned a call to [name], daughter who said she is the POA.   She was driving and her Dad was in the car with her.     He is complaining of aching all over, non-productive coughing, sore throat, runny nose and congestion with sneezing.  The pt is in [place] right now visiting with his daughter [name].   She was wanting Dr. Ronnald Ramp to call in medication for him but I let her know the doctor would want to examine him first before prescribing medications.   I suggested she take him to the urgent care there is [place] where they are located.   [name] was agreeable with this plan.   Reason for Disposition . Patient sounds very sick or weak to the triager  Answer Assessment - Initial Assessment Questions 1. ONSET: "When did the nasal discharge start?"      Started today that he is congested.   Today a little runny nose and sneezing and a congested cough.   The cough just started today. 2. AMOUNT: "How much discharge is there?"      Not coughing up anything. 3. COUGH: "Do you have a cough?" If yes, ask: "Describe the color of your sputum" (clear, white, yellow, green)     See above 4. RESPIRATORY DISTRESS: "Describe your breathing."      No 5. FEVER: "Do you have a fever?" If so, ask: "What is your temperature, how was it measured, and when did it start?"     No   Not checked.   He said my body aches. 6. SEVERITY: "Overall, how bad are you feeling right now?" (e.g., doesn't interfere with normal activities, staying home from school/work, staying in bed)      Aches all over per pt. 7. OTHER SYMPTOMS: "Do you have any other symptoms?" (e.g., sore throat, earache, wheezing, vomiting)     Sore throat, mild wheezing.    8. PREGNANCY: "Is there any chance you are pregnant?" "When was your last menstrual period?"     N/A  Protocols used: COMMON COLD-A-AH

## 2017-07-21 DIAGNOSIS — R4182 Altered mental status, unspecified: Secondary | ICD-10-CM | POA: Diagnosis not present

## 2017-07-21 DIAGNOSIS — I110 Hypertensive heart disease with heart failure: Secondary | ICD-10-CM | POA: Diagnosis not present

## 2017-07-21 DIAGNOSIS — R5381 Other malaise: Secondary | ICD-10-CM | POA: Diagnosis not present

## 2017-07-21 DIAGNOSIS — R262 Difficulty in walking, not elsewhere classified: Secondary | ICD-10-CM | POA: Diagnosis not present

## 2017-07-21 DIAGNOSIS — I509 Heart failure, unspecified: Secondary | ICD-10-CM | POA: Diagnosis not present

## 2017-07-21 DIAGNOSIS — R451 Restlessness and agitation: Secondary | ICD-10-CM | POA: Diagnosis not present

## 2017-07-21 DIAGNOSIS — Z5181 Encounter for therapeutic drug level monitoring: Secondary | ICD-10-CM | POA: Diagnosis not present

## 2017-07-21 DIAGNOSIS — K921 Melena: Secondary | ICD-10-CM | POA: Diagnosis not present

## 2017-07-21 DIAGNOSIS — J189 Pneumonia, unspecified organism: Secondary | ICD-10-CM | POA: Diagnosis not present

## 2017-07-21 DIAGNOSIS — D649 Anemia, unspecified: Secondary | ICD-10-CM | POA: Diagnosis not present

## 2017-07-21 DIAGNOSIS — F0390 Unspecified dementia without behavioral disturbance: Secondary | ICD-10-CM | POA: Diagnosis not present

## 2017-07-21 DIAGNOSIS — N401 Enlarged prostate with lower urinary tract symptoms: Secondary | ICD-10-CM | POA: Diagnosis not present

## 2017-07-21 DIAGNOSIS — I480 Paroxysmal atrial fibrillation: Secondary | ICD-10-CM | POA: Diagnosis not present

## 2017-07-21 DIAGNOSIS — Z7901 Long term (current) use of anticoagulants: Secondary | ICD-10-CM | POA: Diagnosis not present

## 2017-07-21 DIAGNOSIS — I481 Persistent atrial fibrillation: Secondary | ICD-10-CM | POA: Diagnosis not present

## 2017-07-21 DIAGNOSIS — I4891 Unspecified atrial fibrillation: Secondary | ICD-10-CM | POA: Diagnosis not present

## 2017-07-21 DIAGNOSIS — F0391 Unspecified dementia with behavioral disturbance: Secondary | ICD-10-CM | POA: Diagnosis not present

## 2017-07-22 DIAGNOSIS — Z7901 Long term (current) use of anticoagulants: Secondary | ICD-10-CM | POA: Diagnosis not present

## 2017-07-22 DIAGNOSIS — I480 Paroxysmal atrial fibrillation: Secondary | ICD-10-CM | POA: Diagnosis not present

## 2017-07-22 DIAGNOSIS — Z5181 Encounter for therapeutic drug level monitoring: Secondary | ICD-10-CM | POA: Diagnosis not present

## 2017-07-22 DIAGNOSIS — J189 Pneumonia, unspecified organism: Secondary | ICD-10-CM | POA: Diagnosis not present

## 2017-07-23 DIAGNOSIS — K921 Melena: Secondary | ICD-10-CM | POA: Diagnosis not present

## 2017-07-23 DIAGNOSIS — J189 Pneumonia, unspecified organism: Secondary | ICD-10-CM | POA: Diagnosis not present

## 2017-07-23 DIAGNOSIS — N401 Enlarged prostate with lower urinary tract symptoms: Secondary | ICD-10-CM | POA: Diagnosis not present

## 2017-07-23 DIAGNOSIS — I481 Persistent atrial fibrillation: Secondary | ICD-10-CM | POA: Diagnosis not present

## 2017-07-30 DIAGNOSIS — I481 Persistent atrial fibrillation: Secondary | ICD-10-CM | POA: Diagnosis not present

## 2017-07-30 DIAGNOSIS — K921 Melena: Secondary | ICD-10-CM | POA: Diagnosis not present

## 2017-07-30 DIAGNOSIS — N401 Enlarged prostate with lower urinary tract symptoms: Secondary | ICD-10-CM | POA: Diagnosis not present

## 2017-07-30 DIAGNOSIS — J189 Pneumonia, unspecified organism: Secondary | ICD-10-CM | POA: Diagnosis not present

## 2017-08-02 ENCOUNTER — Other Ambulatory Visit: Payer: Self-pay | Admitting: General Practice

## 2017-08-02 ENCOUNTER — Ambulatory Visit (INDEPENDENT_AMBULATORY_CARE_PROVIDER_SITE_OTHER): Payer: Medicare HMO | Admitting: General Practice

## 2017-08-02 DIAGNOSIS — I110 Hypertensive heart disease with heart failure: Secondary | ICD-10-CM | POA: Diagnosis not present

## 2017-08-02 DIAGNOSIS — I509 Heart failure, unspecified: Secondary | ICD-10-CM | POA: Diagnosis not present

## 2017-08-02 DIAGNOSIS — I4891 Unspecified atrial fibrillation: Secondary | ICD-10-CM

## 2017-08-02 DIAGNOSIS — Z7901 Long term (current) use of anticoagulants: Secondary | ICD-10-CM

## 2017-08-02 DIAGNOSIS — M109 Gout, unspecified: Secondary | ICD-10-CM | POA: Diagnosis not present

## 2017-08-02 DIAGNOSIS — I480 Paroxysmal atrial fibrillation: Secondary | ICD-10-CM | POA: Diagnosis not present

## 2017-08-02 DIAGNOSIS — K625 Hemorrhage of anus and rectum: Secondary | ICD-10-CM | POA: Diagnosis not present

## 2017-08-02 DIAGNOSIS — D649 Anemia, unspecified: Secondary | ICD-10-CM | POA: Diagnosis not present

## 2017-08-02 LAB — POCT INR: INR: 1.9 — AB (ref 2.0–3.0)

## 2017-08-02 MED ORDER — WARFARIN SODIUM 3 MG PO TABS: 3.0000 mg | ORAL_TABLET | Freq: Every day | ORAL | 0 refills | Status: DC

## 2017-08-02 MED ORDER — WARFARIN SODIUM 3 MG PO TABS: 3.0000 mg | ORAL_TABLET | Freq: Every day | ORAL | 1 refills | Status: AC

## 2017-08-02 NOTE — Patient Instructions (Addendum)
Pre visit review using our clinic review tool, if applicable. No additional management support is needed unless otherwise documented below in the visit note.  Pt recently dc'd from re-hab on 3 mg of coumadin daily.  Please continue 3 mg daily and re-check in 1 week.  Dosing instructions given to daughter Peter Congo.  Buzz, RN with [facility] is seeing patient in the home.  [phone].

## 2017-08-03 ENCOUNTER — Telehealth: Payer: Self-pay | Admitting: Internal Medicine

## 2017-08-03 DIAGNOSIS — K625 Hemorrhage of anus and rectum: Secondary | ICD-10-CM | POA: Diagnosis not present

## 2017-08-03 DIAGNOSIS — D649 Anemia, unspecified: Secondary | ICD-10-CM | POA: Diagnosis not present

## 2017-08-03 DIAGNOSIS — M109 Gout, unspecified: Secondary | ICD-10-CM | POA: Diagnosis not present

## 2017-08-03 DIAGNOSIS — I110 Hypertensive heart disease with heart failure: Secondary | ICD-10-CM | POA: Diagnosis not present

## 2017-08-03 DIAGNOSIS — I480 Paroxysmal atrial fibrillation: Secondary | ICD-10-CM | POA: Diagnosis not present

## 2017-08-03 DIAGNOSIS — I509 Heart failure, unspecified: Secondary | ICD-10-CM | POA: Diagnosis not present

## 2017-08-03 NOTE — Telephone Encounter (Signed)
Noted -  Pt refused and [facility] is dc'ing pt from [facility] OT

## 2017-08-03 NOTE — Telephone Encounter (Signed)
Copied from [place] [id number]. Topic: Quick Communication - See Telephone Encounter >> Aug 03, 2017  2:03 PM Arletha Grippe wrote: CRM for notification. See Telephone encounter for: 08/03/17. Jennifer with [facility] called Pt is refusing OT.  [name] is discharging that order  Cb is [phone]

## 2017-08-05 DIAGNOSIS — D649 Anemia, unspecified: Secondary | ICD-10-CM | POA: Diagnosis not present

## 2017-08-05 DIAGNOSIS — I509 Heart failure, unspecified: Secondary | ICD-10-CM | POA: Diagnosis not present

## 2017-08-05 DIAGNOSIS — M109 Gout, unspecified: Secondary | ICD-10-CM | POA: Diagnosis not present

## 2017-08-05 DIAGNOSIS — I110 Hypertensive heart disease with heart failure: Secondary | ICD-10-CM | POA: Diagnosis not present

## 2017-08-05 DIAGNOSIS — K625 Hemorrhage of anus and rectum: Secondary | ICD-10-CM | POA: Diagnosis not present

## 2017-08-05 DIAGNOSIS — I480 Paroxysmal atrial fibrillation: Secondary | ICD-10-CM | POA: Diagnosis not present

## 2017-08-11 ENCOUNTER — Telehealth: Payer: Self-pay | Admitting: General Practice

## 2017-08-11 DIAGNOSIS — I110 Hypertensive heart disease with heart failure: Secondary | ICD-10-CM | POA: Diagnosis not present

## 2017-08-11 DIAGNOSIS — K625 Hemorrhage of anus and rectum: Secondary | ICD-10-CM | POA: Diagnosis not present

## 2017-08-11 DIAGNOSIS — I480 Paroxysmal atrial fibrillation: Secondary | ICD-10-CM | POA: Diagnosis not present

## 2017-08-11 DIAGNOSIS — M109 Gout, unspecified: Secondary | ICD-10-CM | POA: Diagnosis not present

## 2017-08-11 DIAGNOSIS — I509 Heart failure, unspecified: Secondary | ICD-10-CM | POA: Diagnosis not present

## 2017-08-11 DIAGNOSIS — D649 Anemia, unspecified: Secondary | ICD-10-CM | POA: Diagnosis not present

## 2017-08-11 NOTE — Telephone Encounter (Signed)
Buzz, RN with [facility] called to report that patient would not let him check his INR yesterday.  Dorice Lamas will try again on Thursday to get INR.  If he cannot check it Thursday Buzz will have to discharge patient from home health.

## 2017-08-12 ENCOUNTER — Inpatient Hospital Stay: Payer: Medicare HMO | Admitting: Internal Medicine

## 2017-08-12 DIAGNOSIS — K625 Hemorrhage of anus and rectum: Secondary | ICD-10-CM | POA: Diagnosis not present

## 2017-08-12 DIAGNOSIS — I509 Heart failure, unspecified: Secondary | ICD-10-CM | POA: Diagnosis not present

## 2017-08-12 DIAGNOSIS — I110 Hypertensive heart disease with heart failure: Secondary | ICD-10-CM | POA: Diagnosis not present

## 2017-08-12 DIAGNOSIS — M109 Gout, unspecified: Secondary | ICD-10-CM | POA: Diagnosis not present

## 2017-08-12 DIAGNOSIS — D649 Anemia, unspecified: Secondary | ICD-10-CM | POA: Diagnosis not present

## 2017-08-12 DIAGNOSIS — I480 Paroxysmal atrial fibrillation: Secondary | ICD-10-CM | POA: Diagnosis not present

## 2017-08-13 ENCOUNTER — Ambulatory Visit (INDEPENDENT_AMBULATORY_CARE_PROVIDER_SITE_OTHER): Payer: Medicare HMO | Admitting: General Practice

## 2017-08-13 DIAGNOSIS — I4891 Unspecified atrial fibrillation: Secondary | ICD-10-CM

## 2017-08-13 DIAGNOSIS — Z7901 Long term (current) use of anticoagulants: Secondary | ICD-10-CM

## 2017-08-13 LAB — POCT INR: INR: 1.6 — AB (ref 2.0–3.0)

## 2017-08-13 NOTE — Patient Instructions (Signed)
Pre visit review using our clinic review tool, if applicable. No additional management support is needed unless otherwise documented below in the visit note.  Take 2 tablets today (5/13) and then change dosage and take 1 tablet daily except 2 tablets on Wednesdays. Dosing instructions given to patient's daughter, Peter Congo and to [place], [place] [facility] [employer].  Please re-check INR in 1 week.

## 2017-08-16 DIAGNOSIS — I509 Heart failure, unspecified: Secondary | ICD-10-CM | POA: Diagnosis not present

## 2017-08-16 DIAGNOSIS — D649 Anemia, unspecified: Secondary | ICD-10-CM | POA: Diagnosis not present

## 2017-08-16 DIAGNOSIS — I110 Hypertensive heart disease with heart failure: Secondary | ICD-10-CM | POA: Diagnosis not present

## 2017-08-16 DIAGNOSIS — I480 Paroxysmal atrial fibrillation: Secondary | ICD-10-CM | POA: Diagnosis not present

## 2017-08-16 DIAGNOSIS — M109 Gout, unspecified: Secondary | ICD-10-CM | POA: Diagnosis not present

## 2017-08-16 DIAGNOSIS — K625 Hemorrhage of anus and rectum: Secondary | ICD-10-CM | POA: Diagnosis not present

## 2017-08-17 ENCOUNTER — Telehealth: Payer: Self-pay | Admitting: General Practice

## 2017-08-17 ENCOUNTER — Ambulatory Visit: Payer: Self-pay | Admitting: General Practice

## 2017-08-17 ENCOUNTER — Telehealth: Payer: Self-pay

## 2017-08-17 NOTE — Telephone Encounter (Signed)
I spoke to [place], patient's daughter.  She is talking with [facility] about getting a different RN to go out and check patient's INR.  She wants to keep her dad on coumadin as long as possible.  Daughter will be in contact with Villa Herb, RN.

## 2017-08-17 NOTE — Telephone Encounter (Signed)
I spoke with Peter Congo, patient's daughter today.  She would like patient to continue taking coumadin.  I explained to her that patient's INR will have to be checked on a regular basis if he is to continue taking the medication.  She will be speaking to [place] today about continuing  Home health.

## 2017-08-17 NOTE — Telephone Encounter (Signed)
Refill request for coumadin.

## 2017-08-17 NOTE — Telephone Encounter (Signed)
-----   Message from Janith Lima, MD sent at 08/16/2017  5:14 PM EDT ----- Regarding: FW: Anticoagulation Yes, you can stop the coumadin   ----- Message ----- From: Warden Fillers, RN Sent: 08/16/2017   4:00 PM To: Janith Lima, MD Subject: Anticoagulation                                Hi Dr. Ronnald Ramp,  Home health RN is reaching out to say that patient is not taking his medications, ie coumadin and RN feels there is no reason to keep checking his INR.  RN also states that patient is belligerent when they go to the home and patient has thrown them out several times.  I'm not sure what the next step should be.  I have spoken with patient's daughter a couple of times about home monitoring but she will not commit to that.  She lives in [place] and apparently is the only one of his children that takes an interest in him.  How do you feel about stopping patient's coumadin?  He isn't taking it anyway.  Please advise.  Thanks, [employer]

## 2017-08-17 NOTE — Telephone Encounter (Signed)
I spoke with the director of [facility] today and she will be going out to patient's home this week to access patient.  [facility] will contact Villa Herb, RN after the visit.

## 2017-08-17 NOTE — Telephone Encounter (Signed)
Copied from [place] [id number]. Topic: Quick Communication - See Telephone Encounter >> Aug 17, 2017  8:55 AM Hewitt Shorts wrote: Pt daughter Peter Congo is call to talk with either Dr. Ronnald Ramp or his assistant about  Buzz from [facility] -he has called stating that the patient does not enough meals in the day ,does not take the medication r Regularly and was told patient to stop his coumadin-Gloria states that none of this was true and also she would aslo like them to know that they have not received any rx for his coumadin-humana was supposed to call Peter Congo and she has heard nothing  Best number [phone]

## 2017-08-18 ENCOUNTER — Other Ambulatory Visit: Payer: Self-pay | Admitting: General Practice

## 2017-08-18 ENCOUNTER — Ambulatory Visit (INDEPENDENT_AMBULATORY_CARE_PROVIDER_SITE_OTHER): Payer: Medicare HMO | Admitting: General Practice

## 2017-08-18 DIAGNOSIS — Z5181 Encounter for therapeutic drug level monitoring: Secondary | ICD-10-CM | POA: Diagnosis not present

## 2017-08-18 LAB — POCT INR: INR: 2 (ref 2.0–3.0)

## 2017-08-18 NOTE — Patient Instructions (Addendum)
Pre visit review using our clinic review tool, if applicable. No additional management support is needed unless otherwise documented below in the visit note.  Take 3 mg daily except 6 mg on Wednesdays.  Re-check in 3 weeks.

## 2017-08-26 DIAGNOSIS — J9691 Respiratory failure, unspecified with hypoxia: Secondary | ICD-10-CM | POA: Diagnosis not present

## 2017-08-26 DIAGNOSIS — R4182 Altered mental status, unspecified: Secondary | ICD-10-CM | POA: Diagnosis not present

## 2017-08-27 DIAGNOSIS — R262 Difficulty in walking, not elsewhere classified: Secondary | ICD-10-CM | POA: Diagnosis not present

## 2017-08-27 DIAGNOSIS — R918 Other nonspecific abnormal finding of lung field: Secondary | ICD-10-CM | POA: Diagnosis not present

## 2017-08-27 DIAGNOSIS — N179 Acute kidney failure, unspecified: Secondary | ICD-10-CM | POA: Diagnosis not present

## 2017-08-27 DIAGNOSIS — J9601 Acute respiratory failure with hypoxia: Secondary | ICD-10-CM | POA: Diagnosis not present

## 2017-08-27 DIAGNOSIS — K559 Vascular disorder of intestine, unspecified: Secondary | ICD-10-CM | POA: Diagnosis not present

## 2017-08-27 DIAGNOSIS — I878 Other specified disorders of veins: Secondary | ICD-10-CM | POA: Diagnosis not present

## 2017-08-27 DIAGNOSIS — Z452 Encounter for adjustment and management of vascular access device: Secondary | ICD-10-CM | POA: Diagnosis not present

## 2017-08-27 DIAGNOSIS — I517 Cardiomegaly: Secondary | ICD-10-CM | POA: Diagnosis not present

## 2017-08-27 DIAGNOSIS — R5381 Other malaise: Secondary | ICD-10-CM | POA: Diagnosis not present

## 2017-08-27 DIAGNOSIS — R0902 Hypoxemia: Secondary | ICD-10-CM | POA: Diagnosis not present

## 2017-08-27 DIAGNOSIS — K551 Chronic vascular disorders of intestine: Secondary | ICD-10-CM | POA: Diagnosis not present

## 2017-08-27 DIAGNOSIS — E46 Unspecified protein-calorie malnutrition: Secondary | ICD-10-CM | POA: Diagnosis not present

## 2017-08-27 DIAGNOSIS — K51511 Left sided colitis with rectal bleeding: Secondary | ICD-10-CM | POA: Diagnosis not present

## 2017-08-27 DIAGNOSIS — M25531 Pain in right wrist: Secondary | ICD-10-CM | POA: Diagnosis not present

## 2017-08-27 DIAGNOSIS — N189 Chronic kidney disease, unspecified: Secondary | ICD-10-CM | POA: Diagnosis not present

## 2017-08-27 DIAGNOSIS — M6281 Muscle weakness (generalized): Secondary | ICD-10-CM | POA: Diagnosis not present

## 2017-08-27 DIAGNOSIS — R531 Weakness: Secondary | ICD-10-CM | POA: Diagnosis not present

## 2017-08-27 DIAGNOSIS — K8020 Calculus of gallbladder without cholecystitis without obstruction: Secondary | ICD-10-CM | POA: Diagnosis not present

## 2017-08-27 DIAGNOSIS — I509 Heart failure, unspecified: Secondary | ICD-10-CM | POA: Diagnosis not present

## 2017-08-27 DIAGNOSIS — Z515 Encounter for palliative care: Secondary | ICD-10-CM | POA: Diagnosis not present

## 2017-08-27 DIAGNOSIS — D649 Anemia, unspecified: Secondary | ICD-10-CM | POA: Diagnosis not present

## 2017-08-27 DIAGNOSIS — S52551A Other extraarticular fracture of lower end of right radius, initial encounter for closed fracture: Secondary | ICD-10-CM | POA: Diagnosis not present

## 2017-08-27 DIAGNOSIS — K6389 Other specified diseases of intestine: Secondary | ICD-10-CM | POA: Diagnosis not present

## 2017-08-27 DIAGNOSIS — R0602 Shortness of breath: Secondary | ICD-10-CM | POA: Diagnosis not present

## 2017-08-27 DIAGNOSIS — R600 Localized edema: Secondary | ICD-10-CM | POA: Diagnosis not present

## 2017-08-27 DIAGNOSIS — J9811 Atelectasis: Secondary | ICD-10-CM | POA: Diagnosis not present

## 2017-08-27 DIAGNOSIS — R1312 Dysphagia, oropharyngeal phase: Secondary | ICD-10-CM | POA: Diagnosis not present

## 2017-08-27 DIAGNOSIS — D62 Acute posthemorrhagic anemia: Secondary | ICD-10-CM | POA: Diagnosis not present

## 2017-08-27 DIAGNOSIS — I714 Abdominal aortic aneurysm, without rupture: Secondary | ICD-10-CM | POA: Diagnosis not present

## 2017-08-27 DIAGNOSIS — R1310 Dysphagia, unspecified: Secondary | ICD-10-CM | POA: Diagnosis not present

## 2017-08-27 DIAGNOSIS — W19XXXA Unspecified fall, initial encounter: Secondary | ICD-10-CM | POA: Diagnosis not present

## 2017-08-27 DIAGNOSIS — I130 Hypertensive heart and chronic kidney disease with heart failure and stage 1 through stage 4 chronic kidney disease, or unspecified chronic kidney disease: Secondary | ICD-10-CM | POA: Diagnosis not present

## 2017-08-27 DIAGNOSIS — M799 Soft tissue disorder, unspecified: Secondary | ICD-10-CM | POA: Diagnosis not present

## 2017-08-27 DIAGNOSIS — I480 Paroxysmal atrial fibrillation: Secondary | ICD-10-CM | POA: Diagnosis not present

## 2017-08-27 DIAGNOSIS — R6520 Severe sepsis without septic shock: Secondary | ICD-10-CM | POA: Diagnosis not present

## 2017-08-27 DIAGNOSIS — M79601 Pain in right arm: Secondary | ICD-10-CM | POA: Diagnosis not present

## 2017-08-27 DIAGNOSIS — R4182 Altered mental status, unspecified: Secondary | ICD-10-CM | POA: Diagnosis not present

## 2017-08-27 DIAGNOSIS — K51411 Inflammatory polyps of colon with rectal bleeding: Secondary | ICD-10-CM | POA: Diagnosis not present

## 2017-08-27 DIAGNOSIS — M25522 Pain in left elbow: Secondary | ICD-10-CM | POA: Diagnosis not present

## 2017-08-27 DIAGNOSIS — K55069 Acute infarction of intestine, part and extent unspecified: Secondary | ICD-10-CM | POA: Diagnosis not present

## 2017-08-27 DIAGNOSIS — I340 Nonrheumatic mitral (valve) insufficiency: Secondary | ICD-10-CM | POA: Diagnosis not present

## 2017-08-27 DIAGNOSIS — M25572 Pain in left ankle and joints of left foot: Secondary | ICD-10-CM | POA: Diagnosis not present

## 2017-08-27 DIAGNOSIS — R6521 Severe sepsis with septic shock: Secondary | ICD-10-CM | POA: Diagnosis not present

## 2017-08-27 DIAGNOSIS — S6991XA Unspecified injury of right wrist, hand and finger(s), initial encounter: Secondary | ICD-10-CM | POA: Diagnosis not present

## 2017-08-27 DIAGNOSIS — J9600 Acute respiratory failure, unspecified whether with hypoxia or hypercapnia: Secondary | ICD-10-CM | POA: Diagnosis not present

## 2017-08-27 DIAGNOSIS — M25422 Effusion, left elbow: Secondary | ICD-10-CM | POA: Diagnosis not present

## 2017-08-27 DIAGNOSIS — Z4682 Encounter for fitting and adjustment of non-vascular catheter: Secondary | ICD-10-CM | POA: Diagnosis not present

## 2017-08-27 DIAGNOSIS — I351 Nonrheumatic aortic (valve) insufficiency: Secondary | ICD-10-CM | POA: Diagnosis not present

## 2017-08-27 DIAGNOSIS — Z48815 Encounter for surgical aftercare following surgery on the digestive system: Secondary | ICD-10-CM | POA: Diagnosis not present

## 2017-08-27 DIAGNOSIS — J90 Pleural effusion, not elsewhere classified: Secondary | ICD-10-CM | POA: Diagnosis not present

## 2017-08-27 DIAGNOSIS — J189 Pneumonia, unspecified organism: Secondary | ICD-10-CM | POA: Diagnosis not present

## 2017-08-27 DIAGNOSIS — A419 Sepsis, unspecified organism: Secondary | ICD-10-CM | POA: Diagnosis not present

## 2017-08-27 DIAGNOSIS — R633 Feeding difficulties: Secondary | ICD-10-CM | POA: Diagnosis not present

## 2017-09-10 ENCOUNTER — Ambulatory Visit: Payer: Medicare HMO

## 2017-09-13 DIAGNOSIS — F0390 Unspecified dementia without behavioral disturbance: Secondary | ICD-10-CM | POA: Diagnosis not present

## 2017-09-13 DIAGNOSIS — R918 Other nonspecific abnormal finding of lung field: Secondary | ICD-10-CM | POA: Diagnosis not present

## 2017-09-13 DIAGNOSIS — R0989 Other specified symptoms and signs involving the circulatory and respiratory systems: Secondary | ICD-10-CM | POA: Diagnosis not present

## 2017-09-13 DIAGNOSIS — Z9049 Acquired absence of other specified parts of digestive tract: Secondary | ICD-10-CM | POA: Diagnosis not present

## 2017-09-13 DIAGNOSIS — I4891 Unspecified atrial fibrillation: Secondary | ICD-10-CM | POA: Diagnosis not present

## 2017-09-13 DIAGNOSIS — M25579 Pain in unspecified ankle and joints of unspecified foot: Secondary | ICD-10-CM | POA: Diagnosis not present

## 2017-09-13 DIAGNOSIS — Z4889 Encounter for other specified surgical aftercare: Secondary | ICD-10-CM | POA: Diagnosis not present

## 2017-09-13 DIAGNOSIS — R05 Cough: Secondary | ICD-10-CM | POA: Diagnosis not present

## 2017-09-13 DIAGNOSIS — Z5181 Encounter for therapeutic drug level monitoring: Secondary | ICD-10-CM | POA: Diagnosis not present

## 2017-09-13 DIAGNOSIS — N183 Chronic kidney disease, stage 3 (moderate): Secondary | ICD-10-CM | POA: Diagnosis not present

## 2017-09-13 DIAGNOSIS — Z48815 Encounter for surgical aftercare following surgery on the digestive system: Secondary | ICD-10-CM | POA: Diagnosis not present

## 2017-09-13 DIAGNOSIS — I130 Hypertensive heart and chronic kidney disease with heart failure and stage 1 through stage 4 chronic kidney disease, or unspecified chronic kidney disease: Secondary | ICD-10-CM | POA: Diagnosis not present

## 2017-09-13 DIAGNOSIS — I5032 Chronic diastolic (congestive) heart failure: Secondary | ICD-10-CM | POA: Diagnosis not present

## 2017-09-13 DIAGNOSIS — A419 Sepsis, unspecified organism: Secondary | ICD-10-CM | POA: Diagnosis not present

## 2017-09-13 DIAGNOSIS — R4182 Altered mental status, unspecified: Secondary | ICD-10-CM | POA: Diagnosis not present

## 2017-09-13 DIAGNOSIS — R5381 Other malaise: Secondary | ICD-10-CM | POA: Diagnosis not present

## 2017-09-13 DIAGNOSIS — I509 Heart failure, unspecified: Secondary | ICD-10-CM | POA: Diagnosis not present

## 2017-09-13 DIAGNOSIS — S52571D Other intraarticular fracture of lower end of right radius, subsequent encounter for closed fracture with routine healing: Secondary | ICD-10-CM | POA: Diagnosis not present

## 2017-09-13 DIAGNOSIS — Z8701 Personal history of pneumonia (recurrent): Secondary | ICD-10-CM | POA: Diagnosis not present

## 2017-09-13 DIAGNOSIS — M6281 Muscle weakness (generalized): Secondary | ICD-10-CM | POA: Diagnosis not present

## 2017-09-13 DIAGNOSIS — Z79899 Other long term (current) drug therapy: Secondary | ICD-10-CM | POA: Diagnosis not present

## 2017-09-13 DIAGNOSIS — K559 Vascular disorder of intestine, unspecified: Secondary | ICD-10-CM | POA: Diagnosis not present

## 2017-09-13 DIAGNOSIS — D649 Anemia, unspecified: Secondary | ICD-10-CM | POA: Diagnosis not present

## 2017-09-13 DIAGNOSIS — T8130XA Disruption of wound, unspecified, initial encounter: Secondary | ICD-10-CM | POA: Diagnosis not present

## 2017-09-13 DIAGNOSIS — D509 Iron deficiency anemia, unspecified: Secondary | ICD-10-CM | POA: Diagnosis not present

## 2017-09-13 DIAGNOSIS — E875 Hyperkalemia: Secondary | ICD-10-CM | POA: Diagnosis not present

## 2017-09-13 DIAGNOSIS — R109 Unspecified abdominal pain: Secondary | ICD-10-CM | POA: Diagnosis not present

## 2017-09-13 DIAGNOSIS — R262 Difficulty in walking, not elsewhere classified: Secondary | ICD-10-CM | POA: Diagnosis not present

## 2017-09-13 DIAGNOSIS — I10 Essential (primary) hypertension: Secondary | ICD-10-CM | POA: Diagnosis not present

## 2017-09-13 DIAGNOSIS — R6520 Severe sepsis without septic shock: Secondary | ICD-10-CM | POA: Diagnosis not present

## 2017-09-13 DIAGNOSIS — J90 Pleural effusion, not elsewhere classified: Secondary | ICD-10-CM | POA: Diagnosis not present

## 2017-09-13 DIAGNOSIS — F0391 Unspecified dementia with behavioral disturbance: Secondary | ICD-10-CM | POA: Diagnosis not present

## 2017-09-13 DIAGNOSIS — N189 Chronic kidney disease, unspecified: Secondary | ICD-10-CM | POA: Diagnosis not present

## 2017-09-13 DIAGNOSIS — R531 Weakness: Secondary | ICD-10-CM | POA: Diagnosis not present

## 2017-09-13 DIAGNOSIS — I480 Paroxysmal atrial fibrillation: Secondary | ICD-10-CM | POA: Diagnosis not present

## 2017-09-13 DIAGNOSIS — R1312 Dysphagia, oropharyngeal phase: Secondary | ICD-10-CM | POA: Diagnosis not present

## 2017-09-13 DIAGNOSIS — T8131XA Disruption of external operation (surgical) wound, not elsewhere classified, initial encounter: Secondary | ICD-10-CM | POA: Diagnosis not present

## 2017-09-13 DIAGNOSIS — Z7901 Long term (current) use of anticoagulants: Secondary | ICD-10-CM | POA: Diagnosis not present

## 2017-09-13 DIAGNOSIS — R456 Violent behavior: Secondary | ICD-10-CM | POA: Diagnosis not present

## 2017-09-13 DIAGNOSIS — K551 Chronic vascular disorders of intestine: Secondary | ICD-10-CM | POA: Diagnosis not present

## 2017-09-14 DIAGNOSIS — I480 Paroxysmal atrial fibrillation: Secondary | ICD-10-CM | POA: Diagnosis not present

## 2017-09-14 DIAGNOSIS — Z48815 Encounter for surgical aftercare following surgery on the digestive system: Secondary | ICD-10-CM | POA: Diagnosis not present

## 2017-09-14 DIAGNOSIS — F0390 Unspecified dementia without behavioral disturbance: Secondary | ICD-10-CM | POA: Diagnosis not present

## 2017-09-14 DIAGNOSIS — S52571D Other intraarticular fracture of lower end of right radius, subsequent encounter for closed fracture with routine healing: Secondary | ICD-10-CM | POA: Diagnosis not present

## 2017-09-15 DIAGNOSIS — S52571D Other intraarticular fracture of lower end of right radius, subsequent encounter for closed fracture with routine healing: Secondary | ICD-10-CM | POA: Diagnosis not present

## 2017-09-15 DIAGNOSIS — I480 Paroxysmal atrial fibrillation: Secondary | ICD-10-CM | POA: Diagnosis not present

## 2017-09-15 DIAGNOSIS — I130 Hypertensive heart and chronic kidney disease with heart failure and stage 1 through stage 4 chronic kidney disease, or unspecified chronic kidney disease: Secondary | ICD-10-CM | POA: Diagnosis not present

## 2017-09-15 DIAGNOSIS — K551 Chronic vascular disorders of intestine: Secondary | ICD-10-CM | POA: Diagnosis not present

## 2017-09-22 DIAGNOSIS — Z48815 Encounter for surgical aftercare following surgery on the digestive system: Secondary | ICD-10-CM | POA: Diagnosis not present

## 2017-09-22 DIAGNOSIS — I480 Paroxysmal atrial fibrillation: Secondary | ICD-10-CM | POA: Diagnosis not present

## 2017-09-22 DIAGNOSIS — D649 Anemia, unspecified: Secondary | ICD-10-CM | POA: Diagnosis not present

## 2017-09-22 DIAGNOSIS — M25579 Pain in unspecified ankle and joints of unspecified foot: Secondary | ICD-10-CM | POA: Diagnosis not present

## 2017-09-25 DIAGNOSIS — Z9049 Acquired absence of other specified parts of digestive tract: Secondary | ICD-10-CM | POA: Diagnosis not present

## 2017-09-25 DIAGNOSIS — R0989 Other specified symptoms and signs involving the circulatory and respiratory systems: Secondary | ICD-10-CM | POA: Diagnosis not present

## 2017-09-25 DIAGNOSIS — I10 Essential (primary) hypertension: Secondary | ICD-10-CM | POA: Diagnosis not present

## 2017-09-25 DIAGNOSIS — T8130XA Disruption of wound, unspecified, initial encounter: Secondary | ICD-10-CM | POA: Diagnosis not present

## 2017-09-25 DIAGNOSIS — K559 Vascular disorder of intestine, unspecified: Secondary | ICD-10-CM | POA: Diagnosis not present

## 2017-09-25 DIAGNOSIS — R05 Cough: Secondary | ICD-10-CM | POA: Diagnosis not present

## 2017-09-25 DIAGNOSIS — Z8701 Personal history of pneumonia (recurrent): Secondary | ICD-10-CM | POA: Diagnosis not present

## 2017-09-25 DIAGNOSIS — I4891 Unspecified atrial fibrillation: Secondary | ICD-10-CM | POA: Diagnosis not present

## 2017-09-25 DIAGNOSIS — D649 Anemia, unspecified: Secondary | ICD-10-CM | POA: Diagnosis not present

## 2017-09-25 DIAGNOSIS — Z79899 Other long term (current) drug therapy: Secondary | ICD-10-CM | POA: Diagnosis not present

## 2017-09-25 DIAGNOSIS — T8131XA Disruption of external operation (surgical) wound, not elsewhere classified, initial encounter: Secondary | ICD-10-CM | POA: Diagnosis not present

## 2017-09-25 DIAGNOSIS — Z4889 Encounter for other specified surgical aftercare: Secondary | ICD-10-CM | POA: Diagnosis not present

## 2017-09-27 DIAGNOSIS — S52571D Other intraarticular fracture of lower end of right radius, subsequent encounter for closed fracture with routine healing: Secondary | ICD-10-CM | POA: Diagnosis not present

## 2017-09-27 DIAGNOSIS — I480 Paroxysmal atrial fibrillation: Secondary | ICD-10-CM | POA: Diagnosis not present

## 2017-09-27 DIAGNOSIS — D649 Anemia, unspecified: Secondary | ICD-10-CM | POA: Diagnosis not present

## 2017-09-27 DIAGNOSIS — Z4889 Encounter for other specified surgical aftercare: Secondary | ICD-10-CM | POA: Diagnosis not present

## 2017-09-28 DIAGNOSIS — S52571D Other intraarticular fracture of lower end of right radius, subsequent encounter for closed fracture with routine healing: Secondary | ICD-10-CM | POA: Diagnosis not present

## 2017-09-28 DIAGNOSIS — D649 Anemia, unspecified: Secondary | ICD-10-CM | POA: Diagnosis not present

## 2017-09-28 DIAGNOSIS — Z4889 Encounter for other specified surgical aftercare: Secondary | ICD-10-CM | POA: Diagnosis not present

## 2017-09-28 DIAGNOSIS — I480 Paroxysmal atrial fibrillation: Secondary | ICD-10-CM | POA: Diagnosis not present

## 2017-09-30 DIAGNOSIS — Z7901 Long term (current) use of anticoagulants: Secondary | ICD-10-CM | POA: Diagnosis not present

## 2017-09-30 DIAGNOSIS — N183 Chronic kidney disease, stage 3 (moderate): Secondary | ICD-10-CM | POA: Diagnosis not present

## 2017-09-30 DIAGNOSIS — D509 Iron deficiency anemia, unspecified: Secondary | ICD-10-CM | POA: Diagnosis not present

## 2017-09-30 DIAGNOSIS — R1312 Dysphagia, oropharyngeal phase: Secondary | ICD-10-CM | POA: Diagnosis not present

## 2017-09-30 DIAGNOSIS — I509 Heart failure, unspecified: Secondary | ICD-10-CM | POA: Diagnosis not present

## 2017-09-30 DIAGNOSIS — M6281 Muscle weakness (generalized): Secondary | ICD-10-CM | POA: Diagnosis not present

## 2017-09-30 DIAGNOSIS — I130 Hypertensive heart and chronic kidney disease with heart failure and stage 1 through stage 4 chronic kidney disease, or unspecified chronic kidney disease: Secondary | ICD-10-CM | POA: Diagnosis not present

## 2017-09-30 DIAGNOSIS — R456 Violent behavior: Secondary | ICD-10-CM | POA: Diagnosis not present

## 2017-09-30 DIAGNOSIS — E875 Hyperkalemia: Secondary | ICD-10-CM | POA: Diagnosis not present

## 2017-09-30 DIAGNOSIS — D649 Anemia, unspecified: Secondary | ICD-10-CM | POA: Diagnosis not present

## 2017-09-30 DIAGNOSIS — Z9049 Acquired absence of other specified parts of digestive tract: Secondary | ICD-10-CM | POA: Diagnosis not present

## 2017-09-30 DIAGNOSIS — R4182 Altered mental status, unspecified: Secondary | ICD-10-CM | POA: Diagnosis not present

## 2017-09-30 DIAGNOSIS — F0391 Unspecified dementia with behavioral disturbance: Secondary | ICD-10-CM | POA: Diagnosis not present

## 2017-09-30 DIAGNOSIS — K551 Chronic vascular disorders of intestine: Secondary | ICD-10-CM | POA: Diagnosis not present

## 2017-09-30 DIAGNOSIS — I480 Paroxysmal atrial fibrillation: Secondary | ICD-10-CM | POA: Diagnosis not present

## 2017-09-30 DIAGNOSIS — I5032 Chronic diastolic (congestive) heart failure: Secondary | ICD-10-CM | POA: Diagnosis not present

## 2017-09-30 DIAGNOSIS — R262 Difficulty in walking, not elsewhere classified: Secondary | ICD-10-CM | POA: Diagnosis not present

## 2017-09-30 DIAGNOSIS — Z48815 Encounter for surgical aftercare following surgery on the digestive system: Secondary | ICD-10-CM | POA: Diagnosis not present

## 2017-09-30 DIAGNOSIS — R5381 Other malaise: Secondary | ICD-10-CM | POA: Diagnosis not present

## 2017-10-07 DIAGNOSIS — R262 Difficulty in walking, not elsewhere classified: Secondary | ICD-10-CM | POA: Diagnosis not present

## 2017-10-07 DIAGNOSIS — D509 Iron deficiency anemia, unspecified: Secondary | ICD-10-CM | POA: Diagnosis not present

## 2017-10-07 DIAGNOSIS — D649 Anemia, unspecified: Secondary | ICD-10-CM | POA: Diagnosis not present

## 2017-10-07 DIAGNOSIS — N183 Chronic kidney disease, stage 3 (moderate): Secondary | ICD-10-CM | POA: Diagnosis not present

## 2017-10-07 DIAGNOSIS — R4182 Altered mental status, unspecified: Secondary | ICD-10-CM | POA: Diagnosis not present

## 2017-10-07 DIAGNOSIS — R5381 Other malaise: Secondary | ICD-10-CM | POA: Diagnosis not present

## 2017-10-07 DIAGNOSIS — M6281 Muscle weakness (generalized): Secondary | ICD-10-CM | POA: Diagnosis not present

## 2017-10-07 DIAGNOSIS — I130 Hypertensive heart and chronic kidney disease with heart failure and stage 1 through stage 4 chronic kidney disease, or unspecified chronic kidney disease: Secondary | ICD-10-CM | POA: Diagnosis not present

## 2017-10-07 DIAGNOSIS — I480 Paroxysmal atrial fibrillation: Secondary | ICD-10-CM | POA: Diagnosis not present

## 2017-10-07 DIAGNOSIS — D62 Acute posthemorrhagic anemia: Secondary | ICD-10-CM | POA: Diagnosis not present

## 2017-10-07 DIAGNOSIS — F0391 Unspecified dementia with behavioral disturbance: Secondary | ICD-10-CM | POA: Diagnosis not present

## 2017-10-07 DIAGNOSIS — E875 Hyperkalemia: Secondary | ICD-10-CM | POA: Diagnosis not present

## 2017-10-07 DIAGNOSIS — Z48815 Encounter for surgical aftercare following surgery on the digestive system: Secondary | ICD-10-CM | POA: Diagnosis not present

## 2017-10-07 DIAGNOSIS — R1312 Dysphagia, oropharyngeal phase: Secondary | ICD-10-CM | POA: Diagnosis not present

## 2017-10-07 DIAGNOSIS — K551 Chronic vascular disorders of intestine: Secondary | ICD-10-CM | POA: Diagnosis not present

## 2017-10-07 DIAGNOSIS — I509 Heart failure, unspecified: Secondary | ICD-10-CM | POA: Diagnosis not present

## 2017-10-13 DIAGNOSIS — N183 Chronic kidney disease, stage 3 (moderate): Secondary | ICD-10-CM | POA: Diagnosis not present

## 2017-10-13 DIAGNOSIS — D62 Acute posthemorrhagic anemia: Secondary | ICD-10-CM | POA: Diagnosis not present

## 2017-10-13 DIAGNOSIS — D509 Iron deficiency anemia, unspecified: Secondary | ICD-10-CM | POA: Diagnosis not present

## 2017-10-13 DIAGNOSIS — I480 Paroxysmal atrial fibrillation: Secondary | ICD-10-CM | POA: Diagnosis not present

## 2017-10-14 DIAGNOSIS — F0150 Vascular dementia without behavioral disturbance: Secondary | ICD-10-CM | POA: Diagnosis not present

## 2017-10-14 DIAGNOSIS — K551 Chronic vascular disorders of intestine: Secondary | ICD-10-CM | POA: Diagnosis not present

## 2017-10-14 DIAGNOSIS — I509 Heart failure, unspecified: Secondary | ICD-10-CM | POA: Diagnosis not present

## 2017-10-14 DIAGNOSIS — I130 Hypertensive heart and chronic kidney disease with heart failure and stage 1 through stage 4 chronic kidney disease, or unspecified chronic kidney disease: Secondary | ICD-10-CM | POA: Diagnosis not present

## 2017-10-14 DIAGNOSIS — Z5181 Encounter for therapeutic drug level monitoring: Secondary | ICD-10-CM | POA: Diagnosis not present

## 2017-10-14 DIAGNOSIS — R1312 Dysphagia, oropharyngeal phase: Secondary | ICD-10-CM | POA: Diagnosis not present

## 2017-10-14 DIAGNOSIS — D62 Acute posthemorrhagic anemia: Secondary | ICD-10-CM | POA: Diagnosis not present

## 2017-10-14 DIAGNOSIS — I4891 Unspecified atrial fibrillation: Secondary | ICD-10-CM | POA: Diagnosis not present

## 2017-10-14 DIAGNOSIS — D509 Iron deficiency anemia, unspecified: Secondary | ICD-10-CM | POA: Diagnosis not present

## 2017-10-14 DIAGNOSIS — R531 Weakness: Secondary | ICD-10-CM | POA: Diagnosis not present

## 2017-10-14 DIAGNOSIS — Z48815 Encounter for surgical aftercare following surgery on the digestive system: Secondary | ICD-10-CM | POA: Diagnosis not present

## 2017-10-14 DIAGNOSIS — M79675 Pain in left toe(s): Secondary | ICD-10-CM | POA: Diagnosis not present

## 2017-10-14 DIAGNOSIS — N189 Chronic kidney disease, unspecified: Secondary | ICD-10-CM | POA: Diagnosis not present

## 2017-10-14 DIAGNOSIS — M6281 Muscle weakness (generalized): Secondary | ICD-10-CM | POA: Diagnosis not present

## 2017-10-14 DIAGNOSIS — R262 Difficulty in walking, not elsewhere classified: Secondary | ICD-10-CM | POA: Diagnosis not present

## 2017-10-14 DIAGNOSIS — Z7901 Long term (current) use of anticoagulants: Secondary | ICD-10-CM | POA: Diagnosis not present

## 2017-10-14 DIAGNOSIS — N183 Chronic kidney disease, stage 3 (moderate): Secondary | ICD-10-CM | POA: Diagnosis not present

## 2017-10-14 DIAGNOSIS — I480 Paroxysmal atrial fibrillation: Secondary | ICD-10-CM | POA: Diagnosis not present

## 2017-10-14 DIAGNOSIS — B351 Tinea unguium: Secondary | ICD-10-CM | POA: Diagnosis not present

## 2017-10-20 DIAGNOSIS — B351 Tinea unguium: Secondary | ICD-10-CM | POA: Diagnosis not present

## 2017-10-20 DIAGNOSIS — M79675 Pain in left toe(s): Secondary | ICD-10-CM | POA: Diagnosis not present

## 2017-10-25 DIAGNOSIS — N183 Chronic kidney disease, stage 3 (moderate): Secondary | ICD-10-CM | POA: Diagnosis not present

## 2017-10-25 DIAGNOSIS — D509 Iron deficiency anemia, unspecified: Secondary | ICD-10-CM | POA: Diagnosis not present

## 2017-10-25 DIAGNOSIS — I4891 Unspecified atrial fibrillation: Secondary | ICD-10-CM | POA: Diagnosis not present

## 2017-10-25 DIAGNOSIS — D62 Acute posthemorrhagic anemia: Secondary | ICD-10-CM | POA: Diagnosis not present

## 2017-10-25 DIAGNOSIS — Z48815 Encounter for surgical aftercare following surgery on the digestive system: Secondary | ICD-10-CM | POA: Diagnosis not present

## 2017-10-25 DIAGNOSIS — F0150 Vascular dementia without behavioral disturbance: Secondary | ICD-10-CM | POA: Diagnosis not present

## 2017-10-25 DIAGNOSIS — M6281 Muscle weakness (generalized): Secondary | ICD-10-CM | POA: Diagnosis not present

## 2017-10-25 DIAGNOSIS — K551 Chronic vascular disorders of intestine: Secondary | ICD-10-CM | POA: Diagnosis not present

## 2017-11-11 DIAGNOSIS — I480 Paroxysmal atrial fibrillation: Secondary | ICD-10-CM | POA: Diagnosis not present

## 2017-11-11 DIAGNOSIS — F0391 Unspecified dementia with behavioral disturbance: Secondary | ICD-10-CM | POA: Diagnosis not present

## 2017-11-11 DIAGNOSIS — R627 Adult failure to thrive: Secondary | ICD-10-CM | POA: Diagnosis not present

## 2017-11-11 DIAGNOSIS — N400 Enlarged prostate without lower urinary tract symptoms: Secondary | ICD-10-CM | POA: Diagnosis not present

## 2017-11-13 DIAGNOSIS — F0391 Unspecified dementia with behavioral disturbance: Secondary | ICD-10-CM | POA: Diagnosis not present

## 2017-11-13 DIAGNOSIS — G4700 Insomnia, unspecified: Secondary | ICD-10-CM | POA: Diagnosis not present

## 2017-11-16 DIAGNOSIS — N189 Chronic kidney disease, unspecified: Secondary | ICD-10-CM | POA: Diagnosis not present

## 2017-11-16 DIAGNOSIS — Z5181 Encounter for therapeutic drug level monitoring: Secondary | ICD-10-CM | POA: Diagnosis not present

## 2017-11-16 DIAGNOSIS — I10 Essential (primary) hypertension: Secondary | ICD-10-CM | POA: Diagnosis not present

## 2017-11-16 DIAGNOSIS — N390 Urinary tract infection, site not specified: Secondary | ICD-10-CM | POA: Diagnosis not present

## 2017-11-16 DIAGNOSIS — E569 Vitamin deficiency, unspecified: Secondary | ICD-10-CM | POA: Diagnosis not present

## 2017-11-16 DIAGNOSIS — E785 Hyperlipidemia, unspecified: Secondary | ICD-10-CM | POA: Diagnosis not present

## 2017-11-17 DIAGNOSIS — R627 Adult failure to thrive: Secondary | ICD-10-CM | POA: Diagnosis not present

## 2017-11-17 DIAGNOSIS — E8809 Other disorders of plasma-protein metabolism, not elsewhere classified: Secondary | ICD-10-CM | POA: Diagnosis not present

## 2017-11-17 DIAGNOSIS — I482 Chronic atrial fibrillation: Secondary | ICD-10-CM | POA: Diagnosis not present

## 2017-11-17 DIAGNOSIS — F0391 Unspecified dementia with behavioral disturbance: Secondary | ICD-10-CM | POA: Diagnosis not present

## 2017-11-19 DIAGNOSIS — Z48815 Encounter for surgical aftercare following surgery on the digestive system: Secondary | ICD-10-CM | POA: Diagnosis not present

## 2017-11-20 DIAGNOSIS — F0390 Unspecified dementia without behavioral disturbance: Secondary | ICD-10-CM | POA: Diagnosis not present

## 2017-11-20 DIAGNOSIS — G4700 Insomnia, unspecified: Secondary | ICD-10-CM | POA: Diagnosis not present

## 2017-12-02 DIAGNOSIS — H9190 Unspecified hearing loss, unspecified ear: Secondary | ICD-10-CM | POA: Diagnosis not present

## 2017-12-02 DIAGNOSIS — F0391 Unspecified dementia with behavioral disturbance: Secondary | ICD-10-CM | POA: Diagnosis not present

## 2017-12-02 DIAGNOSIS — R627 Adult failure to thrive: Secondary | ICD-10-CM | POA: Diagnosis not present

## 2017-12-02 DIAGNOSIS — H6123 Impacted cerumen, bilateral: Secondary | ICD-10-CM | POA: Diagnosis not present

## 2017-12-08 DIAGNOSIS — M6281 Muscle weakness (generalized): Secondary | ICD-10-CM | POA: Diagnosis not present

## 2017-12-08 DIAGNOSIS — F0391 Unspecified dementia with behavioral disturbance: Secondary | ICD-10-CM | POA: Diagnosis not present

## 2017-12-08 DIAGNOSIS — I480 Paroxysmal atrial fibrillation: Secondary | ICD-10-CM | POA: Diagnosis not present

## 2017-12-08 DIAGNOSIS — I10 Essential (primary) hypertension: Secondary | ICD-10-CM | POA: Diagnosis not present

## 2017-12-08 DIAGNOSIS — K55041 Focal (segmental) acute infarction of large intestine: Secondary | ICD-10-CM | POA: Diagnosis not present

## 2017-12-13 DIAGNOSIS — R531 Weakness: Secondary | ICD-10-CM | POA: Diagnosis not present

## 2017-12-20 IMAGING — DX DG CHEST 2V
2 series · 2 of 2 positions shown · non-contrast
Comparison: 07/08/2015.

CLINICAL DATA: Right-sided chest pain for 2 weeks. History of
hypertension and atrial fibrillation.

EXAM:
CHEST  2 VIEW

[chest pa]
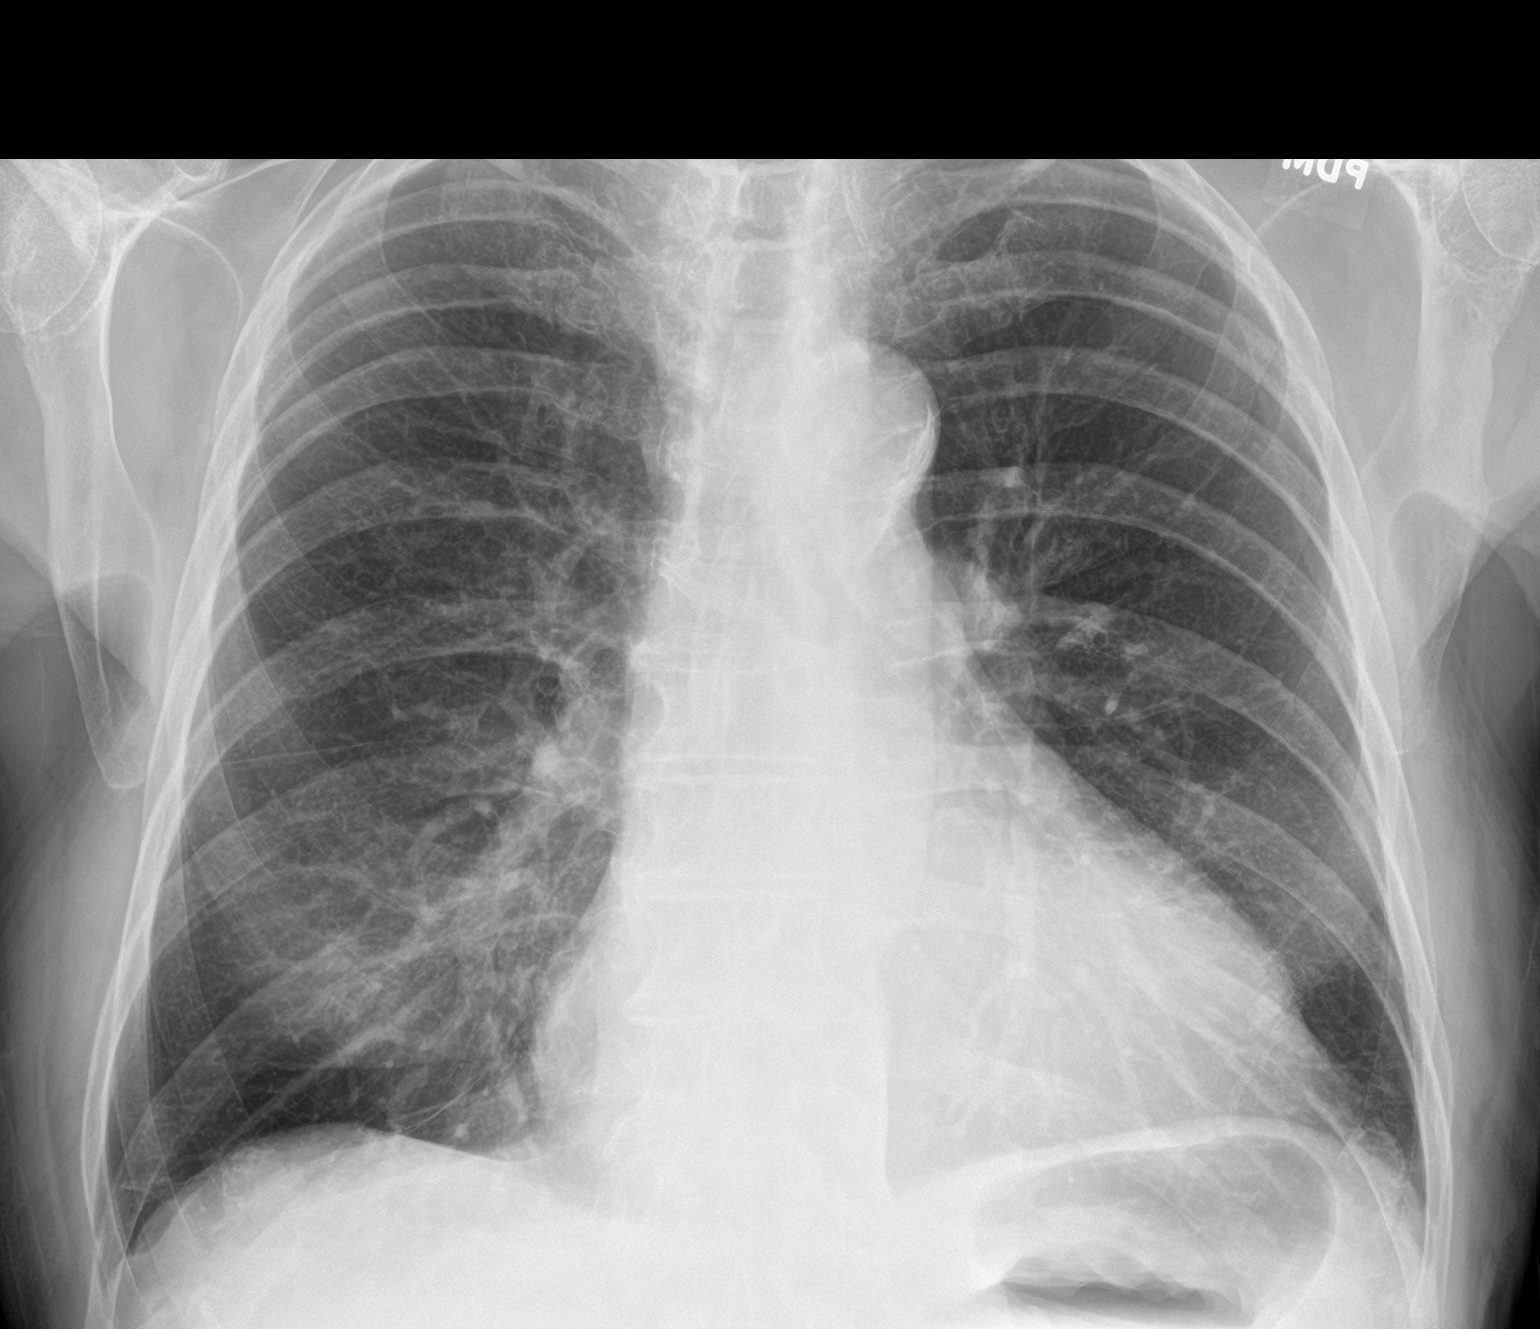

[chest lat]
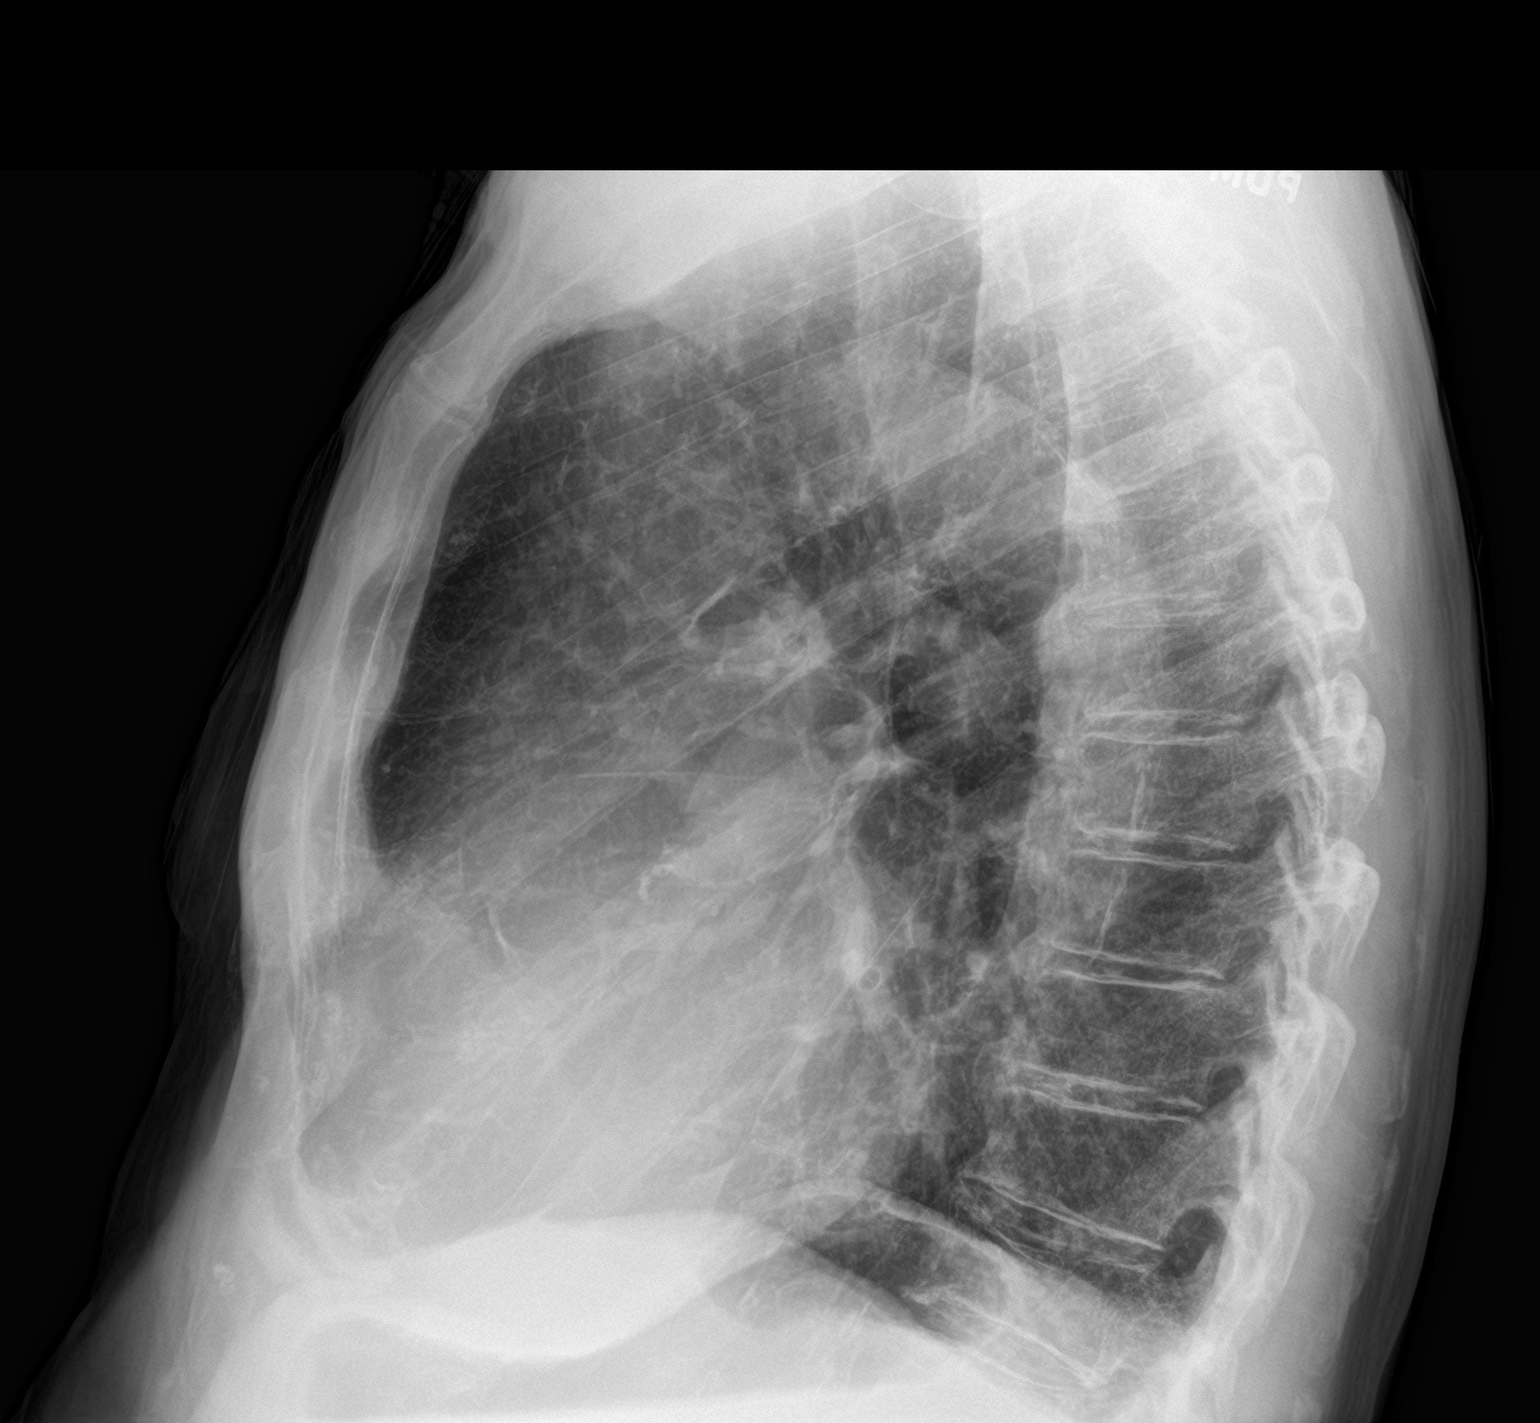

[2 of 2 positions shown; findings below may reference images not displayed]

FINDINGS: Cardiac silhouette is mildly enlarged. No mediastinal or hilar
masses. No evidence of adenopathy.

Minor linear opacities noted at the lung bases consistent with
scarring. Lungs otherwise clear.

No pleural effusion or pneumothorax.

Skeletal structures are demineralized but intact.
IMPRESSION: 1. No acute cardiopulmonary disease.
2. Mild cardiomegaly.

## 2017-12-25 DIAGNOSIS — F0391 Unspecified dementia with behavioral disturbance: Secondary | ICD-10-CM | POA: Diagnosis not present

## 2017-12-25 DIAGNOSIS — G4700 Insomnia, unspecified: Secondary | ICD-10-CM | POA: Diagnosis not present

## 2017-12-27 DIAGNOSIS — Z23 Encounter for immunization: Secondary | ICD-10-CM | POA: Diagnosis not present

## 2018-03-11 ENCOUNTER — Ambulatory Visit: Payer: Self-pay | Admitting: General Practice

## 2018-03-11 DIAGNOSIS — Z5181 Encounter for therapeutic drug level monitoring: Secondary | ICD-10-CM

## 2018-04-02 IMAGING — DX DG FOREARM 2V*L*
2 series · 2 of 2 positions shown · non-contrast
Comparison: None.

CLINICAL DATA: Fall with distal pain

EXAM:
LEFT FOREARM - 2 VIEW

[forearm ap]
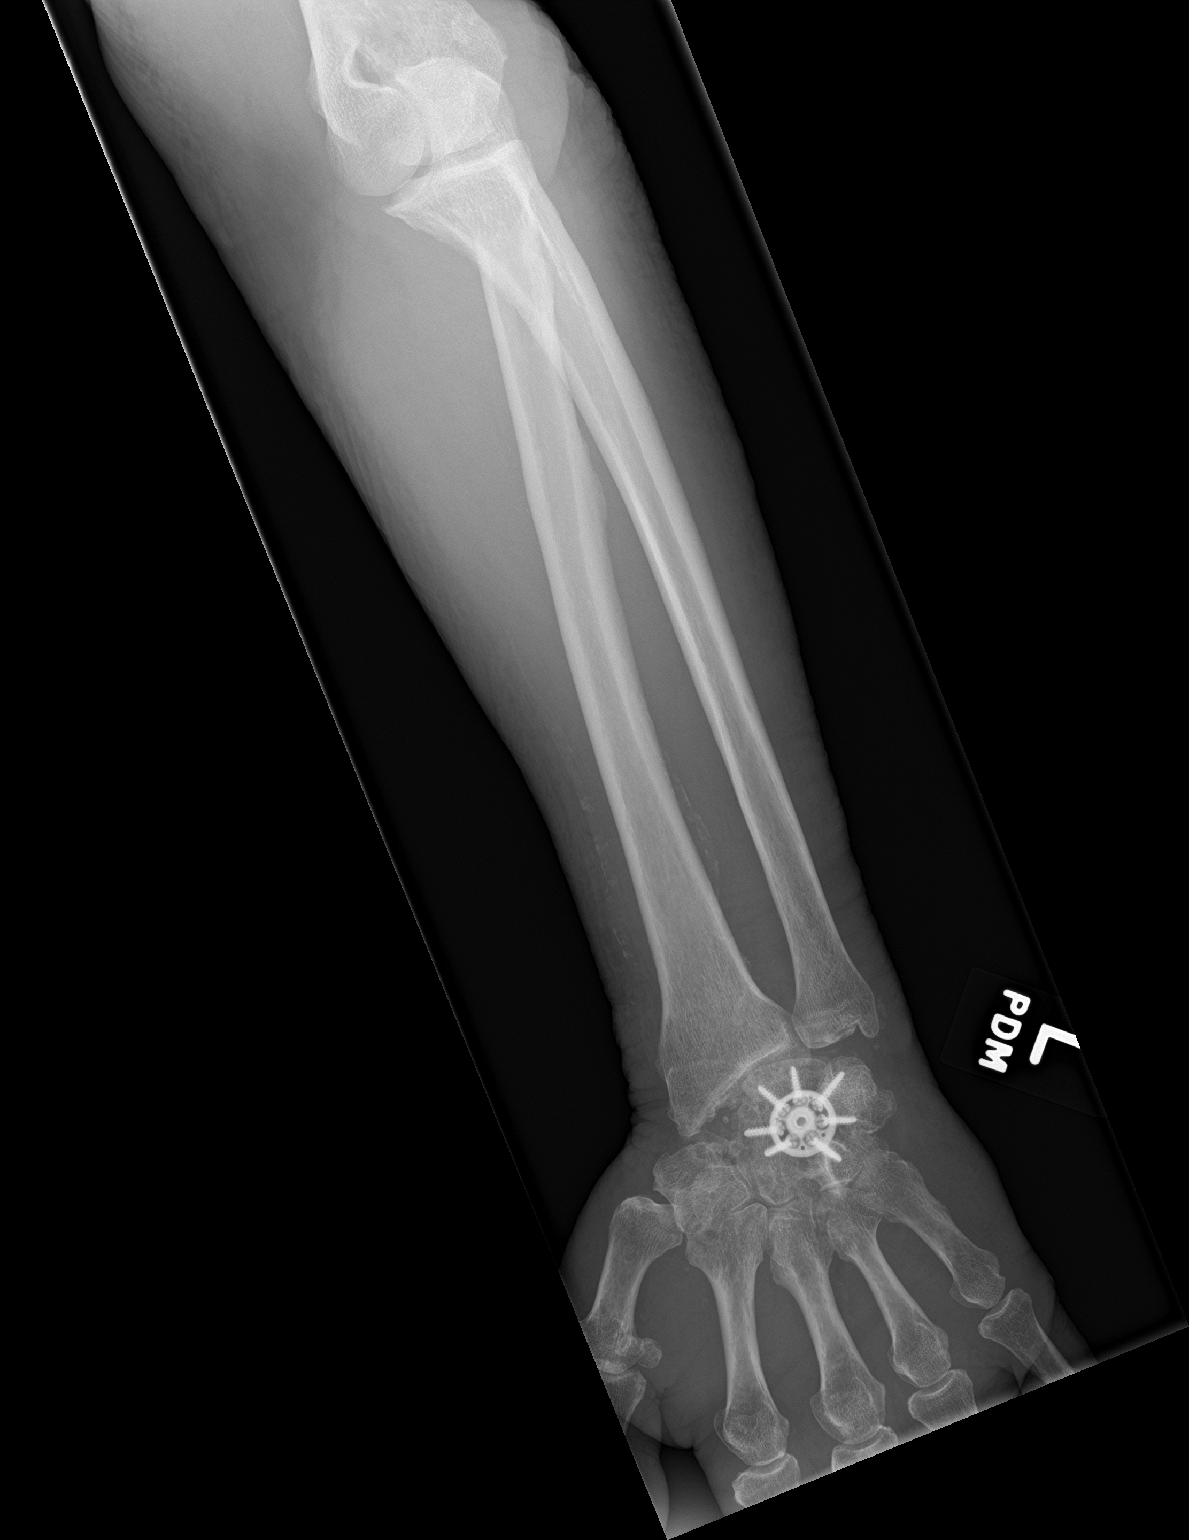

[forearm lat]
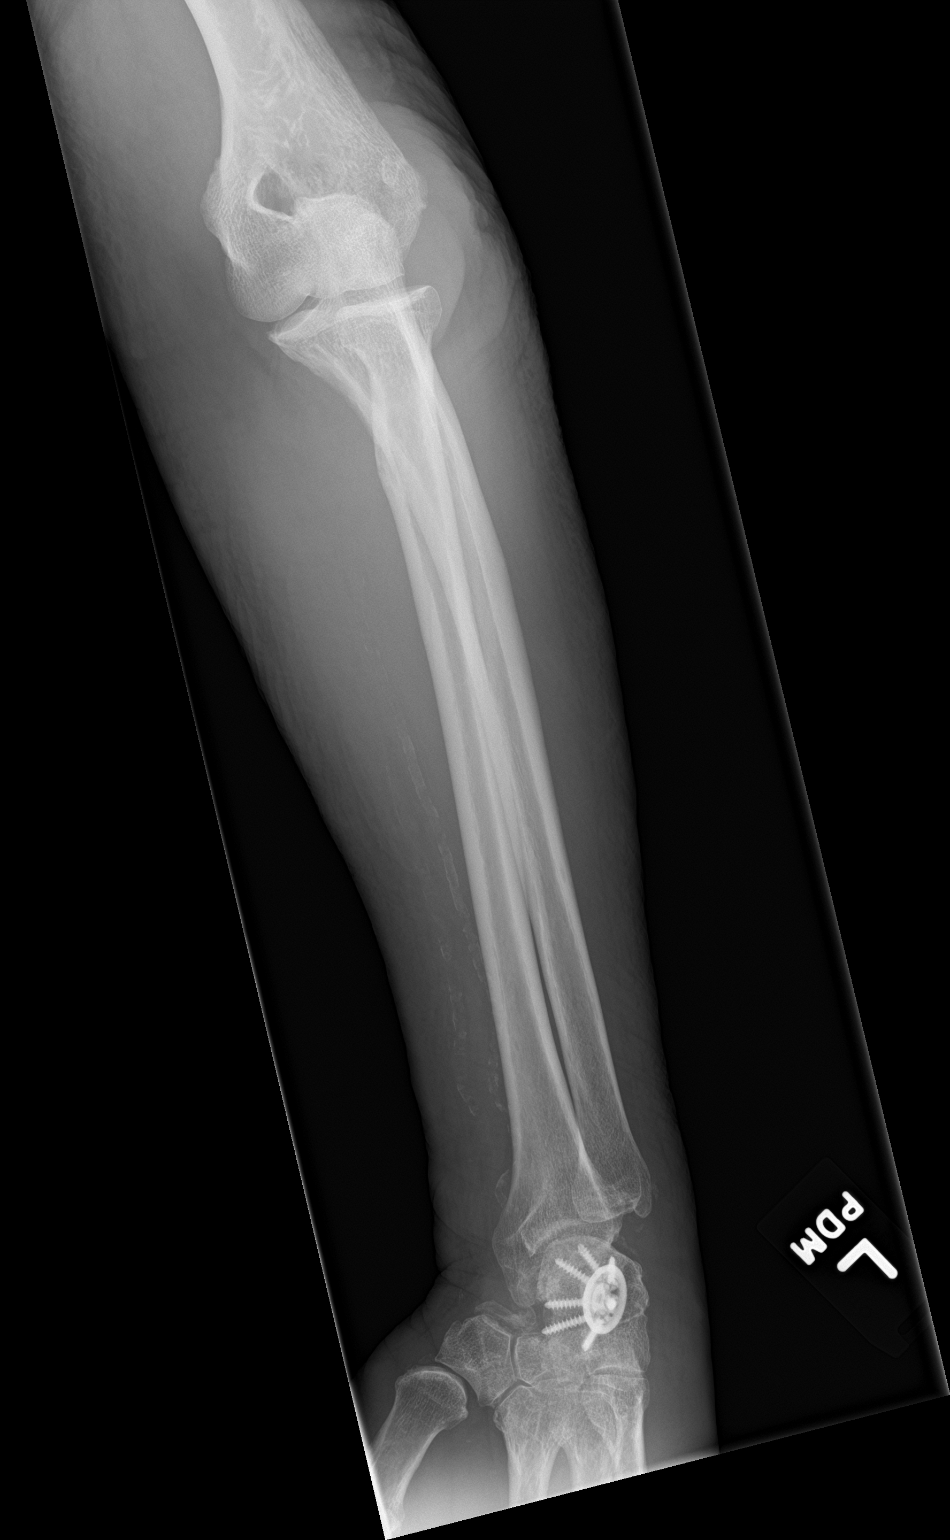

[2 of 2 positions shown; findings below may reference images not displayed]

FINDINGS: Vascular calcifications. No acute displaced fracture or malalignment
is seen.
IMPRESSION: No acute osseous abnormality

## 2018-06-15 DEATH — deceased
